# Patient Record
Sex: Male | Born: 1945
Health system: Southern US, Community
[De-identification: ages and names within clinical notes are randomized; demographics above are authoritative.]

## PROBLEM LIST (undated history)

## (undated) DIAGNOSIS — M1990 Unspecified osteoarthritis, unspecified site: Secondary | ICD-10-CM

## (undated) DIAGNOSIS — Z8619 Personal history of other infectious and parasitic diseases: Secondary | ICD-10-CM

## (undated) DIAGNOSIS — I10 Essential (primary) hypertension: Secondary | ICD-10-CM

## (undated) DIAGNOSIS — E785 Hyperlipidemia, unspecified: Secondary | ICD-10-CM

## (undated) DIAGNOSIS — C61 Malignant neoplasm of prostate: Secondary | ICD-10-CM

## (undated) DIAGNOSIS — K219 Gastro-esophageal reflux disease without esophagitis: Secondary | ICD-10-CM

## (undated) HISTORY — PX: ULNAR NERVE TRANSPOSITION: SHX2595

## (undated) HISTORY — PX: SPINE SURGERY: SHX786

## (undated) HISTORY — PX: SKIN GRAFT FULL THICKNESS ARM: SUR1297

## (undated) HISTORY — DX: Personal history of other infectious and parasitic diseases: Z86.19

## (undated) HISTORY — DX: Hyperlipidemia, unspecified: E78.5

## (undated) HISTORY — PX: EYE SURGERY: SHX253

## (undated) HISTORY — PX: OTHER SURGICAL HISTORY: SHX169

## (undated) HISTORY — DX: Gastro-esophageal reflux disease without esophagitis: K21.9

## (undated) HISTORY — DX: Essential (primary) hypertension: I10

## (undated) HISTORY — PX: PROSTATE BIOPSY: SHX241

---

## 2010-10-14 ENCOUNTER — Encounter: Payer: Self-pay | Admitting: Internal Medicine

## 2010-10-14 ENCOUNTER — Ambulatory Visit: Admit: 2010-10-14 | Payer: Self-pay | Admitting: Internal Medicine

## 2010-10-14 ENCOUNTER — Ambulatory Visit (INDEPENDENT_AMBULATORY_CARE_PROVIDER_SITE_OTHER): Payer: BC Managed Care – PPO | Admitting: Internal Medicine

## 2010-10-14 DIAGNOSIS — E663 Overweight: Secondary | ICD-10-CM

## 2010-10-14 DIAGNOSIS — Z6825 Body mass index (BMI) 25.0-25.9, adult: Secondary | ICD-10-CM

## 2010-10-14 DIAGNOSIS — I10 Essential (primary) hypertension: Secondary | ICD-10-CM | POA: Insufficient documentation

## 2010-10-14 DIAGNOSIS — R635 Abnormal weight gain: Secondary | ICD-10-CM

## 2010-10-14 LAB — BASIC METABOLIC PANEL
BUN: 16 mg/dL (ref 6–23)
CO2: 29 mEq/L (ref 19–32)
Chloride: 101 mEq/L (ref 96–112)
Creatinine, Ser: 1 mg/dL (ref 0.4–1.5)

## 2010-10-14 MED ORDER — VALSARTAN-HYDROCHLOROTHIAZIDE 80-12.5 MG PO TABS: 1.0000 | ORAL_TABLET | Freq: Every day | ORAL | Status: DC

## 2010-10-14 MED ORDER — VALSARTAN 80 MG PO TABS: 80.0000 mg | ORAL_TABLET | Freq: Two times a day (BID) | ORAL | Status: DC

## 2010-10-14 MED ORDER — HYDROCHLOROTHIAZIDE 12.5 MG PO CAPS: 12.5000 mg | ORAL_CAPSULE | Freq: Every day | ORAL | Status: DC

## 2010-10-14 NOTE — Patient Instructions (Signed)
  Place appropriate patient instructions regarding hypertension here. 

## 2010-10-14 NOTE — Progress Notes (Signed)
Addended by: Stacie Glaze MD on: 10/14/2010 03:33 PM   Modules accepted: Orders

## 2010-10-14 NOTE — Progress Notes (Signed)
Addended by: Rita Ohara on: 10/14/2010 04:09 PM   Modules accepted: Orders

## 2010-10-14 NOTE — Progress Notes (Signed)
  Subjective:    Anthony Mann is a 65 y.o. male who presents for evaluation of elevated blood pressures. 59 at onset of elevated blood pressure: . Cardiac symptoms: none. Patient denies: chest pain. Cardiovascular risk factors: none. Use of agents associated with hypertension: NSAIDS. History of target organ damage: none.  The following portions of the patient's history were reviewed and updated as appropriate: allergies, current medications, past family history, past medical history, past social history, past surgical history and problem list.  Review of Systems A comprehensive review of systems was negative.   Objective:    BP 140/80  Pulse 76  Temp(Src) 98.2 F (36.8 C) (Oral)  Resp 14  Ht 5\' 7"  (1.702 m)  Wt 186 lb (84.369 kg)  BMI 29.13 kg/m2  General Appearance:    Alert, cooperative, no distress, appears stated age  Head:    Normocephalic, without obvious abnormality, atraumatic  Eyes:    PERRL, conjunctiva/corneas clear, EOM's intact, fundi    benign, both eyes       Ears:    Normal TM's and external ear canals, both ears  Nose:   Nares normal, septum midline, mucosa normal, no drainage    or sinus tenderness  Throat:   Lips, mucosa, and tongue normal; teeth and gums normal  Neck:   Supple, symmetrical, trachea midline, no adenopathy;       thyroid:  No enlargement/tenderness/nodules; no carotid   bruit or JVD  Back:     Symmetric, no curvature, ROM normal, no CVA tenderness  Lungs:     Clear to auscultation bilaterally, respirations unlabored  Chest wall:    No tenderness or deformity  Heart:    Regular rate and rhythm, S1 and S2 normal, no murmur, rub   or gallop  Abdomen:     Soft, non-tender, bowel sounds active all four quadrants,    no masses, no organomegaly  Genitalia:    Normal male without lesion, discharge or tenderness  Rectal:    Normal tone, normal prostate, no masses or tenderness;   guaiac negative stool  Extremities:   Extremities normal, atraumatic, no  cyanosis or edema  Pulses:   2+ and symmetric all extremities  Skin:   Skin color, texture, turgor normal, no rashes or lesions  Lymph nodes:   Cervical, supraclavicular, and axillary nodes normal  Neurologic:   CNII-XII intact. Normal strength, sensation and reflexes      throughout    Cardiographics ECG: normal sinus rhythm per the pts records from his prior MD. He had a stress test two years ago with out any ischemia noted   Assessment:    Hypertension, stage 1. Evidence of target organ damage: none.   Plan:    Check blood pressures 1 times weekly and record. patient will be monitored on three-month intervals he has been used to having a been a check for his potassium since he is on a diuretic he will have a yearly physical and we will obtain his records from his prior physician's office and review them including the history of his stress test.  He has no other complaints at this time is compliant with his medication we discussed diet and exercise gave him a plan for both eyes in [place] for 10 pound weight loss prior to his next visit

## 2010-10-23 ENCOUNTER — Other Ambulatory Visit: Payer: Self-pay | Admitting: *Deleted

## 2010-10-23 ENCOUNTER — Other Ambulatory Visit: Payer: Self-pay | Admitting: Internal Medicine

## 2010-10-23 DIAGNOSIS — I10 Essential (primary) hypertension: Secondary | ICD-10-CM

## 2010-10-23 MED ORDER — VALSARTAN-HYDROCHLOROTHIAZIDE 80-12.5 MG PO TABS: 1.0000 | ORAL_TABLET | Freq: Every day | ORAL | Status: DC

## 2011-01-12 ENCOUNTER — Encounter: Payer: Self-pay | Admitting: Internal Medicine

## 2011-01-12 ENCOUNTER — Ambulatory Visit (INDEPENDENT_AMBULATORY_CARE_PROVIDER_SITE_OTHER): Payer: BC Managed Care – PPO | Admitting: Internal Medicine

## 2011-01-12 VITALS — BP 130/80 | HR 72 | Temp 98.2°F | Resp 16 | Ht 66.0 in | Wt 188.0 lb

## 2011-01-12 DIAGNOSIS — M545 Low back pain: Secondary | ICD-10-CM

## 2011-01-12 DIAGNOSIS — I10 Essential (primary) hypertension: Secondary | ICD-10-CM

## 2011-01-12 DIAGNOSIS — Z0289 Encounter for other administrative examinations: Secondary | ICD-10-CM

## 2011-01-12 MED ORDER — APAP-MGSALICY-PHENYLTOLOX-CAFF 500-500-20-50 MG PO TABS: 1.0000 | ORAL_TABLET | Freq: Two times a day (BID) | ORAL | Status: DC | PRN

## 2011-01-12 NOTE — Progress Notes (Signed)
  Subjective:    Patient here for follow-up of elevated blood pressure.  He is exercising and is adherent to a low-salt diet.  Blood pressure is well controlled at home. Cardiac symptoms: none. Patient denies: chest pressure/discomfort. Cardiovascular risk factors: advanced age (older than 57 for men, 61 for women). Use of agents associated with hypertension: none. History of target organ damage: none. Patient also has a an acute/chronic complaint of low back pain he states that this was diagnosed as osteoarthritis in the past it is intermittent it does not occur on a daily basis and sometimes does not occur on a weekly basis patient requests guidance as to exercise and pain control.  The following portions of the patient's history were reviewed and updated as appropriate: allergies, current medications, past family history, past medical history, past social history, past surgical history and problem list.  Review of Systems A comprehensive review of systems was negative.     Objective:    BP 130/80  Pulse 72  Temp(Src) 98.2 F (36.8 C) (Oral)  Resp 16  Ht 5\' 6"  (1.676 m)  Wt 188 lb (85.276 kg)  BMI 30.34 kg/m2  General Appearance:    Alert, cooperative, no distress, appears stated age  Head:    Normocephalic, without obvious abnormality, atraumatic  Eyes:    PERRL, conjunctiva/corneas clear, EOM's intact, fundi    benign, both eyes       Ears:    Normal TM's and external ear canals, both ears  Nose:   Nares normal, septum midline, mucosa normal, no drainage    or sinus tenderness  Throat:   Lips, mucosa, and tongue normal; teeth and gums normal  Neck:   Supple, symmetrical, trachea midline, no adenopathy;       thyroid:  No enlargement/tenderness/nodules; no carotid   bruit or JVD  Back:     Symmetric, no curvature, ROM normal, no CVA tenderness  Lungs:     Clear to auscultation bilaterally, respirations unlabored  Chest wall:    No tenderness or deformity  Heart:    Regular rate  and rhythm, S1 and S2 normal, no murmur, rub   or gallop  Abdomen:     Soft, non-tender, bowel sounds active all four quadrants,    no masses, no organomegaly  Genitalia:    Normal male without lesion, discharge or tenderness  Rectal:    Normal tone, normal prostate, no masses or tenderness;   guaiac negative stool  Extremities:   Extremities normal, atraumatic, no cyanosis or edema  Pulses:   2+ and symmetric all extremities  Skin:   Skin color, texture, turgor normal, no rashes or lesions  Lymph nodes:   Cervical, supraclavicular, and axillary nodes normal  Neurologic:   CNII-XII intact. Normal strength, sensation and reflexes      throughout      Assessment:   The patient complains of chronic low back pain that has osteoarthritis in nature  Hypertension, stage 1 . Evidence of target organ damage: none.    Plan:    Medication: continue diovan.  Trial of dural back twice a day when necessary back pain exercises given for chronic back pain

## 2011-04-14 ENCOUNTER — Encounter: Payer: Self-pay | Admitting: Internal Medicine

## 2011-04-14 ENCOUNTER — Ambulatory Visit (INDEPENDENT_AMBULATORY_CARE_PROVIDER_SITE_OTHER): Payer: BC Managed Care – PPO | Admitting: Internal Medicine

## 2011-04-14 VITALS — BP 130/82 | HR 68 | Temp 98.2°F | Resp 16 | Ht 67.0 in | Wt 180.0 lb

## 2011-04-14 DIAGNOSIS — Z0000 Encounter for general adult medical examination without abnormal findings: Secondary | ICD-10-CM

## 2011-04-14 DIAGNOSIS — I10 Essential (primary) hypertension: Secondary | ICD-10-CM

## 2011-04-14 LAB — BASIC METABOLIC PANEL
BUN: 19 mg/dL (ref 6–23)
Chloride: 104 mEq/L (ref 96–112)
GFR: 71.66 mL/min (ref >60.00)
Glucose, Bld: 100 mg/dL — ABNORMAL HIGH (ref 70–99)
Potassium: 4 mEq/L (ref 3.5–5.1)
Sodium: 137 mEq/L (ref 135–145)

## 2011-04-14 MED ORDER — LOSARTAN POTASSIUM-HCTZ 100-25 MG PO TABS: 1.0000 | ORAL_TABLET | Freq: Every day | ORAL | Status: DC

## 2011-04-14 NOTE — Progress Notes (Signed)
  Subjective:    Patient ID: Anthony Mann, male    DOB: May 31, 1946, 65 y.o.   MRN: [id number]  HPI The patient has lost 8 pounds since his last visit his blood pressure is better controlled at 130/82. He is on Diovan 80-12.5 Stable on this medication a basic metabolic panel will be drawn today a long-term prescription given Review of Systems  Constitutional: Negative for fever and fatigue.  HENT: Negative for hearing loss, congestion, neck pain and postnasal drip.   Eyes: Negative for discharge, redness and visual disturbance.  Respiratory: Negative for cough, shortness of breath and wheezing.   Cardiovascular: Negative for leg swelling.  Gastrointestinal: Negative for abdominal pain, constipation and abdominal distention.  Genitourinary: Negative for urgency and frequency.  Musculoskeletal: Negative for joint swelling and arthralgias.  Skin: Negative for color change and rash.  Neurological: Negative for weakness and light-headedness.  Hematological: Negative for adenopathy.  Psychiatric/Behavioral: Negative for behavioral problems.   Past Medical History  Diagnosis Date  . Hypertension   . History of hepatitis B   . Cataract     right eye   Past Surgical History  Procedure Date  . Eye surgery     right eye cataract  in [place]  . Arm trauma     he had an industrial accident in 1991   . Skin graft full thickness arm     right arm  . Ulnar nerve transposition     in 1991    reports that he has never smoked. He has never used smokeless tobacco. He reports that he drinks alcohol. He reports that he does not use illicit drugs. family history includes Cancer in his father; Diabetes in his mother; Heart disease in his father and mother; and Kidney disease in his father. No Known Allergies     Objective:   Physical Exam  Nursing note and vitals reviewed. Constitutional: He appears well-developed and well-nourished.  HENT:  Head: Normocephalic and atraumatic.  Eyes: Conjunctivae  are normal. Pupils are equal, round, and reactive to light.  Neck: Normal range of motion. Neck supple.  Cardiovascular: Normal rate and regular rhythm.   Pulmonary/Chest: Effort normal and breath sounds normal.  Abdominal: Soft. Bowel sounds are normal.          Assessment & Plan:  Due to changes in the formulary of the patient's insurance plan he will no longer be able to take Diovan. His formulary states that Hyzaar is the only covered ARB we discussed with the patient in detail and we'll switch the patient to Hyzaar 100-25 We spent 30 minutes face-to-face counseling the patient on blood pressure medicine changes and the potential effect of the increased dose of hydrochlorothiazide and we will obtain a basic metabolic panel to see what the patient's potassium is so we can monitor the effect of the increased hydrochlorothiazide on his potassium.  Patient is aware of the change in the reason for the change in

## 2011-05-13 ENCOUNTER — Telehealth: Payer: Self-pay | Admitting: Internal Medicine

## 2011-05-13 DIAGNOSIS — I10 Essential (primary) hypertension: Secondary | ICD-10-CM

## 2011-05-13 MED ORDER — LOSARTAN POTASSIUM-HCTZ 100-25 MG PO TABS: 1.0000 | ORAL_TABLET | Freq: Every day | ORAL | Status: DC

## 2011-05-13 NOTE — Telephone Encounter (Signed)
Pt requesting a 90 day refill on losartan-hydrochlorothiazide (HYZAAR) 100-25 MG per tablet [facility]

## 2011-05-13 NOTE — Telephone Encounter (Signed)
Sent!

## 2011-05-20 ENCOUNTER — Telehealth: Payer: Self-pay | Admitting: Internal Medicine

## 2011-05-20 DIAGNOSIS — I10 Essential (primary) hypertension: Secondary | ICD-10-CM

## 2011-05-20 MED ORDER — LOSARTAN POTASSIUM-HCTZ 100-25 MG PO TABS: 1.0000 | ORAL_TABLET | Freq: Every day | ORAL | Status: DC

## 2011-05-20 NOTE — Telephone Encounter (Signed)
rx sent in electronically 

## 2011-05-20 NOTE — Telephone Encounter (Signed)
Please resend losartan-hydrochlorothiazide (HYZAAR) 100-25 MG per tablet to [facility]. Was sent on  05/13/11 [facility] said [facility] did not recieve

## 2011-05-25 ENCOUNTER — Telehealth: Payer: Self-pay | Admitting: Internal Medicine

## 2011-05-25 DIAGNOSIS — I10 Essential (primary) hypertension: Secondary | ICD-10-CM

## 2011-05-25 MED ORDER — LOSARTAN POTASSIUM-HCTZ 100-25 MG PO TABS: 1.0000 | ORAL_TABLET | Freq: Every day | ORAL | Status: DC

## 2011-05-25 NOTE — Telephone Encounter (Signed)
Pt is still having an issuses with [facility]. RX has been sent twice and [facility] is saying they have not received it. Pt has requested we call it in or fax it.   Please resend losartan-hydrochlorothiazide (HYZAAR) 100-25 MG per tablet to [facility]. Was sent on 05/13/11 and 05/20/11.

## 2011-05-25 NOTE — Telephone Encounter (Signed)
rx faxed [facility]

## 2011-07-13 ENCOUNTER — Ambulatory Visit (INDEPENDENT_AMBULATORY_CARE_PROVIDER_SITE_OTHER): Payer: 59

## 2011-07-13 DIAGNOSIS — Z23 Encounter for immunization: Secondary | ICD-10-CM

## 2011-09-03 ENCOUNTER — Other Ambulatory Visit: Payer: 59

## 2011-09-03 ENCOUNTER — Other Ambulatory Visit: Payer: Self-pay

## 2011-09-15 ENCOUNTER — Ambulatory Visit (INDEPENDENT_AMBULATORY_CARE_PROVIDER_SITE_OTHER): Payer: BC Managed Care – PPO | Admitting: Internal Medicine

## 2011-09-15 ENCOUNTER — Encounter: Payer: Self-pay | Admitting: Internal Medicine

## 2011-09-15 VITALS — BP 130/84 | HR 68 | Temp 98.2°F | Resp 16 | Ht 67.0 in | Wt 172.0 lb

## 2011-09-15 DIAGNOSIS — I10 Essential (primary) hypertension: Secondary | ICD-10-CM

## 2011-09-15 DIAGNOSIS — Z0000 Encounter for general adult medical examination without abnormal findings: Secondary | ICD-10-CM

## 2011-09-15 LAB — HEPATIC FUNCTION PANEL
ALT: 26 U/L (ref 0–53)
AST: 25 U/L (ref 0–37)
Bilirubin, Direct: 0.2 mg/dL (ref 0.0–0.3)
Total Bilirubin: 1.2 mg/dL (ref 0.3–1.2)

## 2011-09-15 LAB — LIPID PANEL
LDL Cholesterol: 104 mg/dL — ABNORMAL HIGH (ref 0–99)
Total CHOL/HDL Ratio: 3
Triglycerides: 97 mg/dL (ref 0.0–149.0)
VLDL: 19.4 mg/dL (ref 0.0–40.0)

## 2011-09-15 LAB — POCT URINALYSIS DIPSTICK
Ketones, UA: NEGATIVE
Leukocytes, UA: NEGATIVE
Nitrite, UA: NEGATIVE
Protein, UA: NEGATIVE
Urobilinogen, UA: 0.2
pH, UA: 6

## 2011-09-15 LAB — BASIC METABOLIC PANEL
Chloride: 102 mEq/L (ref 96–112)
Creatinine, Ser: 1 mg/dL (ref 0.4–1.5)
Potassium: 4.2 mEq/L (ref 3.5–5.1)

## 2011-09-15 LAB — CBC WITH DIFFERENTIAL/PLATELET
Basophils Absolute: 0 10*3/uL (ref 0.0–0.1)
Hemoglobin: 16 g/dL (ref 13.0–17.0)
Lymphocytes Relative: 24.6 % (ref 12.0–46.0)
Monocytes Relative: 8.5 % (ref 3.0–12.0)
Neutro Abs: 5.8 10*3/uL (ref 1.4–7.7)
Neutrophils Relative %: 66 % (ref 43.0–77.0)
RDW: 12.9 % (ref 11.5–14.6)

## 2011-09-15 LAB — TSH: TSH: 1.54 u[IU]/mL (ref 0.35–5.50)

## 2011-09-15 LAB — PSA: PSA: 2.07 ng/mL (ref 0.10–4.00)

## 2011-09-15 MED ORDER — LOSARTAN POTASSIUM-HCTZ 100-25 MG PO TABS: 1.0000 | ORAL_TABLET | Freq: Every day | ORAL | Status: DC

## 2011-09-15 NOTE — Patient Instructions (Signed)
The patient is instructed to continue all medications as prescribed. Schedule followup with check out clerk upon leaving the clinic  

## 2011-09-15 NOTE — Progress Notes (Signed)
Subjective:    Patient ID: Anthony Mann, male    DOB: 02-01-1946, 66 y.o.   MRN: [id number]  HPI The patient is a 66 year old male who presents for complete physical examination today. He is treated for hypertension and is on omega-3 supplementation for cholesterol. He also takes medications for chronic back pain. He takes these when necessary and this has been infrequent. 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  HENT: Negative for hearing loss, congestion, neck pain and postnasal drip.   Eyes: Negative for discharge, redness and visual disturbance.  Respiratory: Negative for cough, shortness of breath and wheezing.   Cardiovascular: Negative for leg swelling.  Gastrointestinal: Negative for abdominal pain, constipation and abdominal distention.  Genitourinary: Negative for urgency and frequency.  Musculoskeletal: Negative for joint swelling and arthralgias.  Skin: Negative for color change and rash.  Neurological: Negative for weakness and light-headedness.  Hematological: Negative for adenopathy.  Psychiatric/Behavioral: Negative for behavioral problems.   Past Medical History  Diagnosis Date  . Hypertension   . History of hepatitis B   . Cataract     right eye    History   Social History  . Marital Status: Married    Spouse Name: donna    Number of Children: 2  . Years of Education: N/A   Occupational History  . retired    Social History Main Topics  . Smoking status: Never Smoker   . Smokeless tobacco: Never Used  . Alcohol Use: Yes  . Drug Use: No  . Sexually Active: Yes    Birth Control/ Protection: None   Other Topics Concern  . Not on file   Social History Narrative  . No narrative on file    Past Surgical History  Procedure Date  . Eye surgery     right eye cataract  in [place]  . Arm trauma     he had an industrial accident in  1991   . Skin graft full thickness arm     right arm  . Ulnar nerve transposition     in 1991    Family History  Problem Relation Age of Onset  . Cancer Father     colon  . Heart disease Father   . Kidney disease Father     nephectomy  . Heart disease Mother   . Diabetes Mother     No Known Allergies  Current Outpatient Prescriptions on File Prior to Visit  Medication Sig Dispense Refill  . APAP-MgSalicy-Phenyltolox-Caff 500-500-20-50 MG TABS Take 1 tablet by mouth 2 (two) times daily as needed.  30 each  3  . fish oil-omega-3 fatty acids 1000 MG capsule Take 1 g by mouth daily.       Marland Kitchen losartan-hydrochlorothiazide (HYZAAR) 100-25 MG per tablet Take 1 tablet by mouth daily.  90 tablet  3  . naproxen sodium (ANAPROX) 220 MG tablet Take 220 mg by mouth as needed.          BP 130/84  Pulse 68  Temp 98.2 F (36.8 C)  Resp 16  Ht 5\' 7"  (1.702 m)  Wt 172 lb (78.019 kg)  BMI 26.94 kg/m2       Objective:   Physical Exam  Nursing note and vitals reviewed. Constitutional: He is oriented to person, place, and time. He appears well-developed and well-nourished.  HENT:  Head: Normocephalic and atraumatic.  Eyes: Conjunctivae are normal. Pupils are equal, round, and reactive to light.  Neck: Normal range of motion. Neck supple.  Cardiovascular: Normal rate and regular rhythm.   Pulmonary/Chest: Effort normal and breath sounds normal.  Abdominal: Soft. Bowel sounds are normal.  Genitourinary: Rectum normal and prostate normal.  Musculoskeletal: Normal range of motion.  Neurological: He is alert and oriented to person, place, and time.  Skin: Skin is warm and dry.  Psychiatric: He has a normal mood and affect. His behavior is normal.          Assessment & Plan:   Patient presents for yearly preventative medicine examination.   all immunizations and health maintenance protocols were reviewed with the patient and they are up to date with these protocols.   screening  laboratory values were reviewed with the patient including screening of hyperlipidemia PSA renal function and hepatic function.   There medications past medical history social history problem list and allergies were reviewed in detail.   Goals were established with regard to weight loss exercise diet in compliance with medications Patient pressure stable on his current medications we will give him 90 day supply with 3 refills

## 2012-03-14 ENCOUNTER — Ambulatory Visit (INDEPENDENT_AMBULATORY_CARE_PROVIDER_SITE_OTHER): Payer: BC Managed Care – PPO | Admitting: Internal Medicine

## 2012-03-14 ENCOUNTER — Encounter: Payer: Self-pay | Admitting: Internal Medicine

## 2012-03-14 VITALS — BP 136/74 | HR 72 | Temp 98.4°F | Ht 66.0 in | Wt 173.0 lb

## 2012-03-14 DIAGNOSIS — I10 Essential (primary) hypertension: Secondary | ICD-10-CM

## 2012-03-14 DIAGNOSIS — M76899 Other specified enthesopathies of unspecified lower limb, excluding foot: Secondary | ICD-10-CM

## 2012-03-14 DIAGNOSIS — M6580 Other synovitis and tenosynovitis, unspecified site: Secondary | ICD-10-CM

## 2012-03-14 DIAGNOSIS — Z0000 Encounter for general adult medical examination without abnormal findings: Secondary | ICD-10-CM

## 2012-03-14 MED ORDER — LOSARTAN POTASSIUM-HCTZ 100-25 MG PO TABS: 1.0000 | ORAL_TABLET | Freq: Every day | ORAL | Status: DC

## 2012-03-14 NOTE — Progress Notes (Signed)
Subjective:    Patient ID: Anthony Mann, male    DOB: 09-12-1946, 66 y.o.   MRN: [id number]  HPI Working on weight loss through "hard work" Stable on the fish oil and the HTN medications   Review of Systems  Constitutional: Negative for fever and fatigue.  HENT: Negative for hearing loss, congestion, neck pain and postnasal drip.   Eyes: Negative for discharge, redness and visual disturbance.  Respiratory: Negative for cough, shortness of breath and wheezing.   Cardiovascular: Negative for leg swelling.  Gastrointestinal: Negative for abdominal pain, constipation and abdominal distention.  Genitourinary: Negative for urgency and frequency.  Musculoskeletal: Negative for joint swelling and arthralgias.  Skin: Negative for color change and rash.  Neurological: Negative for weakness and light-headedness.  Hematological: Negative for adenopathy.  Psychiatric/Behavioral: Negative for behavioral problems.   Past Medical History  Diagnosis Date  . Hypertension   . History of hepatitis B   . Cataract     right eye    History   Social History  . Marital Status: Married    Spouse Name: donna    Number of Children: 2  . Years of Education: N/A   Occupational History  . retired    Social History Main Topics  . Smoking status: Never Smoker   . Smokeless tobacco: Never Used  . Alcohol Use: Yes  . Drug Use: No  . Sexually Active: Yes    Birth Control/ Protection: None   Other Topics Concern  . Not on file   Social History Narrative  . No narrative on file    Past Surgical History  Procedure Date  . Eye surgery     right eye cataract  in [place]  . Arm trauma     he had an industrial accident in 1991   . Skin graft full thickness arm     right arm  . Ulnar nerve transposition     in 1991    Family History  Problem Relation Age of Onset  . Cancer Father     colon  . Heart disease Father   . Kidney disease Father     nephectomy  . Heart disease Mother   .  Diabetes Mother     No Known Allergies  Current Outpatient Prescriptions on File Prior to Visit  Medication Sig Dispense Refill  . fish oil-omega-3 fatty acids 1000 MG capsule Take 1 g by mouth daily.       Marland Kitchen losartan-hydrochlorothiazide (HYZAAR) 100-25 MG per tablet Take 1 tablet by mouth daily.  90 tablet  3  . APAP-MgSalicy-Phenyltolox-Caff 500-500-20-50 MG TABS Take 1 tablet by mouth 2 (two) times daily as needed.  30 each  3    BP 136/74  Pulse 72  Temp 98.4 F (36.9 C) (Oral)  Ht 5\' 6"  (1.676 m)  Wt 173 lb (78.472 kg)  BMI 27.92 kg/m2       Objective:   Physical Exam  Nursing note and vitals reviewed. Constitutional: He appears well-developed and well-nourished.  HENT:  Head: Normocephalic and atraumatic.  Eyes: Conjunctivae are normal. Pupils are equal, round, and reactive to light.  Neck: Normal range of motion. Neck supple.  Cardiovascular: Normal rate and regular rhythm.   Pulmonary/Chest: Effort normal and breath sounds normal.  Abdominal: Soft. Bowel sounds are normal.          Assessment & Plan:  Patient has a pulled hamstring we talked him how to appropriate stretches to mobilize hamstring muscle.  The patient has  hypertension that is stable his current medications we sent him a refillable prescription for his medications for the year.  The patient has mild to moderate hyperlipidemia stable his current medications we will check a lipid and liver at the time of his physical in January blood work was ordered for his physical in January and return visit was ordered as was discussed in detail with the

## 2012-03-14 NOTE — Patient Instructions (Addendum)
The patient is instructed to continue all medications as prescribed. Schedule followup with check out clerk upon leaving the clinic "hamstring tendonitis"

## 2012-04-24 ENCOUNTER — Other Ambulatory Visit: Payer: Self-pay | Admitting: Internal Medicine

## 2012-08-07 ENCOUNTER — Other Ambulatory Visit: Payer: Self-pay | Admitting: Internal Medicine

## 2012-09-11 ENCOUNTER — Other Ambulatory Visit (INDEPENDENT_AMBULATORY_CARE_PROVIDER_SITE_OTHER): Payer: BC Managed Care – PPO

## 2012-09-11 DIAGNOSIS — Z0000 Encounter for general adult medical examination without abnormal findings: Secondary | ICD-10-CM

## 2012-09-11 LAB — POCT URINALYSIS DIPSTICK
Blood, UA: NEGATIVE
Ketones, UA: NEGATIVE
Protein, UA: NEGATIVE
Spec Grav, UA: 1.025
Urobilinogen, UA: 0.2

## 2012-09-11 LAB — LIPID PANEL
Cholesterol: 190 mg/dL (ref 0–200)
HDL: 57.6 mg/dL (ref >39.00)
Total CHOL/HDL Ratio: 3
Triglycerides: 112 mg/dL (ref 0.0–149.0)

## 2012-09-11 LAB — BASIC METABOLIC PANEL
CO2: 27 mEq/L (ref 19–32)
Calcium: 9.6 mg/dL (ref 8.4–10.5)
Chloride: 104 mEq/L (ref 96–112)
Sodium: 139 mEq/L (ref 135–145)

## 2012-09-11 LAB — HEPATIC FUNCTION PANEL
Albumin: 4.2 g/dL (ref 3.5–5.2)
Bilirubin, Direct: 0.1 mg/dL (ref 0.0–0.3)
Total Protein: 6.9 g/dL (ref 6.0–8.3)

## 2012-09-11 LAB — TSH: TSH: 1.13 u[IU]/mL (ref 0.35–5.50)

## 2012-09-12 LAB — CBC WITH DIFFERENTIAL/PLATELET
Basophils Absolute: 0.2 10*3/uL — ABNORMAL HIGH (ref 0.0–0.1)
Eosinophils Absolute: 0.1 10*3/uL (ref 0.0–0.7)
HCT: 45.9 % (ref 39.0–52.0)
Lymphs Abs: 2.5 10*3/uL (ref 0.7–4.0)
MCHC: 34 g/dL (ref 30.0–36.0)
MCV: 96.7 fl (ref 78.0–100.0)
Monocytes Absolute: 0.1 10*3/uL (ref 0.1–1.0)
Platelets: 216 10*3/uL (ref 150.0–400.0)
RDW: 12.5 % (ref 11.5–14.6)

## 2012-09-18 ENCOUNTER — Encounter: Payer: Self-pay | Admitting: Internal Medicine

## 2012-09-18 ENCOUNTER — Ambulatory Visit (INDEPENDENT_AMBULATORY_CARE_PROVIDER_SITE_OTHER): Payer: BC Managed Care – PPO | Admitting: Internal Medicine

## 2012-09-18 VITALS — BP 133/78 | HR 72 | Temp 98.2°F | Resp 16 | Ht 66.0 in | Wt 168.0 lb

## 2012-09-18 DIAGNOSIS — M549 Dorsalgia, unspecified: Secondary | ICD-10-CM

## 2012-09-18 DIAGNOSIS — Z0000 Encounter for general adult medical examination without abnormal findings: Secondary | ICD-10-CM

## 2012-09-18 MED ORDER — BUTALBITAL-ACETAMINOPHEN 50-325 MG PO TABS: 2.0000 | ORAL_TABLET | Freq: Three times a day (TID) | ORAL | Status: DC | PRN

## 2012-09-18 NOTE — Progress Notes (Signed)
Subjective:    Patient ID: Anthony Mann, male    DOB: 1946-07-05, 67 y.o.   MRN: [id number]  HPI CPX   Review of Systems  Constitutional: Negative for fever and fatigue.  HENT: Negative for hearing loss, congestion, neck pain and postnasal drip.   Eyes: Negative for discharge, redness and visual disturbance.  Respiratory: Negative for cough, shortness of breath and wheezing.   Cardiovascular: Negative for leg swelling.  Gastrointestinal: Negative for abdominal pain, constipation and abdominal distention.  Genitourinary: Negative for urgency and frequency.  Musculoskeletal: Negative for joint swelling and arthralgias.  Skin: Negative for color change and rash.  Neurological: Negative for weakness and light-headedness.  Hematological: Negative for adenopathy.  Psychiatric/Behavioral: Negative for behavioral problems.   Past Medical History  Diagnosis Date  . Hypertension   . History of hepatitis B   . Cataract     right eye    History   Social History  . Marital Status: Married    Spouse Name: donna    Number of Children: 2  . Years of Education: N/A   Occupational History  . retired    Social History Main Topics  . Smoking status: Never Smoker   . Smokeless tobacco: Never Used  . Alcohol Use: Yes  . Drug Use: No  . Sexually Active: Yes    Birth Control/ Protection: None   Other Topics Concern  . Not on file   Social History Narrative  . No narrative on file    Past Surgical History  Procedure Date  . Eye surgery     right eye cataract  in [place]  . Arm trauma     he had an industrial accident in 1991   . Skin graft full thickness arm     right arm  . Ulnar nerve transposition     in 1991    Family History  Problem Relation Age of Onset  . Cancer Father     colon  . Heart disease Father   . Kidney disease Father     nephectomy  . Heart disease Mother   . Diabetes Mother     No Known Allergies  Current Outpatient Prescriptions on File Prior to  Visit  Medication Sig Dispense Refill  . butalbital-acetaminophen-caffeine (ESGIC PLUS) 50-500-40 MG per tablet TAKE 1 TABLET BY MOUTH TWICE A DAY AS NEEDED  30 tablet  0  . fish oil-omega-3 fatty acids 1000 MG capsule Take 1 g by mouth daily.       Marland Kitchen losartan-hydrochlorothiazide (HYZAAR) 100-25 MG per tablet Take 1 tablet by mouth daily.  90 tablet  3    BP 140/80  Pulse 72  Temp 98.2 F (36.8 C)  Resp 16  Ht 5\' 6"  (1.676 m)  Wt 168 lb (76.204 kg)  BMI 27.12 kg/m2       Objective:   Physical Exam  Nursing note and vitals reviewed. Constitutional: He appears well-developed and well-nourished.  HENT:  Head: Normocephalic and atraumatic.  Eyes: Conjunctivae normal are normal. Pupils are equal, round, and reactive to light.  Neck: Normal range of motion. Neck supple.  Cardiovascular: Normal rate and regular rhythm.   Pulmonary/Chest: Effort normal and breath sounds normal.  Abdominal: Soft. Bowel sounds are normal.  Musculoskeletal: Normal range of motion.  Neurological: He is alert.  Skin: Skin is warm and dry.  Psychiatric: He has a normal mood and affect. His behavior is normal.          Assessment & Plan:  Patient presents for yearly preventative medicine examination.   all immunizations and health maintenance protocols were reviewed with the patient and they are up to date with these protocols.   screening laboratory values were reviewed with the patient including screening of hyperlipidemia PSA renal function and hepatic function.   There medications past medical history social history problem list and allergies were reviewed in detail.   Goals were established with regard to weight loss exercise diet in compliance with medications  Stable on medications Discussed the role of sALT

## 2012-09-18 NOTE — Patient Instructions (Signed)
The patient is instructed to continue all medications as prescribed. Schedule followup with check out clerk upon leaving the clinic  

## 2012-09-20 ENCOUNTER — Telehealth: Payer: Self-pay | Admitting: Internal Medicine

## 2012-09-20 MED ORDER — CYCLOBENZAPRINE HCL 10 MG PO TABS: 10.0000 mg | ORAL_TABLET | Freq: Three times a day (TID) | ORAL | Status: DC | PRN

## 2012-09-20 NOTE — Telephone Encounter (Signed)
Per dr Lovell Sheehan- may send in flexeril 10mg  1 tid prn with 9 refill-wife informed

## 2012-09-20 NOTE — Telephone Encounter (Signed)
Spouse is calling to say patient was in to see Dr Lovell Sheehan on 09/18/12 for back pain.  Pt was prescribed Acetaminophen - Butalbital.  Caller states that this medication does not help the patient and he needs a muscle relaxant.  Caller is requesting a RX of Flexeril for pt.  OFFICE PLEASE FOLLOW UP WITH CALLER/PATIENT REGARDING RX REQUEST.

## 2012-11-19 ENCOUNTER — Other Ambulatory Visit: Payer: Self-pay | Admitting: Internal Medicine

## 2012-11-27 ENCOUNTER — Other Ambulatory Visit: Payer: Self-pay | Admitting: Internal Medicine

## 2013-01-18 ENCOUNTER — Other Ambulatory Visit: Payer: Self-pay | Admitting: Internal Medicine

## 2013-03-19 ENCOUNTER — Ambulatory Visit (INDEPENDENT_AMBULATORY_CARE_PROVIDER_SITE_OTHER): Payer: BC Managed Care – PPO | Admitting: Internal Medicine

## 2013-03-19 ENCOUNTER — Encounter: Payer: Self-pay | Admitting: Internal Medicine

## 2013-03-19 VITALS — BP 136/80 | HR 72 | Temp 98.2°F | Resp 16 | Ht 66.0 in | Wt 170.0 lb

## 2013-03-19 DIAGNOSIS — I10 Essential (primary) hypertension: Secondary | ICD-10-CM

## 2013-03-19 DIAGNOSIS — M62838 Other muscle spasm: Secondary | ICD-10-CM

## 2013-03-19 MED ORDER — CYCLOBENZAPRINE HCL 10 MG PO TABS: 10.0000 mg | ORAL_TABLET | Freq: Two times a day (BID) | ORAL | Status: DC | PRN

## 2013-03-19 NOTE — Patient Instructions (Addendum)
The patient is instructed to continue all medications as prescribed. Schedule followup with check out clerk upon leaving the clinic  

## 2013-03-19 NOTE — Progress Notes (Signed)
Subjective:    Patient ID: Anthony Mann, male    DOB: 1946/01/06, 67 y.o.   MRN: [id number]  HPI  Patient is followed for hypertension.  He is on fish oil supplementation or omega-3 supplementation for hyperlipidemia.  He rarely uses the plastic plus for headache he does use of Flexeril for muscle spasm on an as-needed basis  Review of Systems  Constitutional: Negative for fever and fatigue.  HENT: Negative for hearing loss, congestion, neck pain and postnasal drip.   Eyes: Negative for discharge, redness and visual disturbance.  Respiratory: Negative for cough, shortness of breath and wheezing.   Cardiovascular: Negative for leg swelling.  Gastrointestinal: Negative for abdominal pain, constipation and abdominal distention.  Genitourinary: Negative for urgency and frequency.  Musculoskeletal: Negative for joint swelling and arthralgias.  Skin: Negative for color change and rash.  Neurological: Negative for weakness and light-headedness.  Hematological: Negative for adenopathy.  Psychiatric/Behavioral: Negative for behavioral problems.   Past Medical History  Diagnosis Date  . Hypertension   . History of hepatitis B   . Cataract     right eye    History   Social History  . Marital Status: Married    Spouse Name: donna    Number of Children: 2  . Years of Education: N/A   Occupational History  . retired    Social History Main Topics  . Smoking status: Never Smoker   . Smokeless tobacco: Never Used  . Alcohol Use: Yes  . Drug Use: No  . Sexually Active: Yes    Birth Control/ Protection: None   Other Topics Concern  . Not on file   Social History Narrative  . No narrative on file    Past Surgical History  Procedure Laterality Date  . Eye surgery      right eye cataract  in [place]  . Arm trauma      he had an industrial accident in 1991   . Skin graft full thickness arm      right arm  . Ulnar nerve transposition      in 1991    Family History  Problem  Relation Age of Onset  . Cancer Father     colon  . Heart disease Father   . Kidney disease Father     nephectomy  . Heart disease Mother   . Diabetes Mother     No Known Allergies  Current Outpatient Prescriptions on File Prior to Visit  Medication Sig Dispense Refill  . ACETAMINOPHEN-BUTALBITAL 50-325 MG TABS Take 2 tablets by mouth 3 (three) times daily as needed.  120 each  0  . butalbital-acetaminophen-caffeine (ESGIC PLUS) 50-500-40 MG per tablet TAKE 1 TABLET BY MOUTH TWICE A DAY AS NEEDED  30 tablet  0  . cyclobenzaprine (FLEXERIL) 10 MG tablet TAKE 1 TABLET BY MOUTH 3 TIMES A DAY AS NEEDED FOR MUSCLE SPASMS.  30 tablet  0  . fish oil-omega-3 fatty acids 1000 MG capsule Take 1 g by mouth daily.       Marland Kitchen losartan-hydrochlorothiazide (HYZAAR) 100-25 MG per tablet TAKE 1 TABLET BY MOUTH DAILY  90 tablet  2   No current facility-administered medications on file prior to visit.    BP 136/80  Pulse 72  Temp(Src) 98.2 F (36.8 C)  Resp 16  Ht 5\' 6"  (1.676 m)  Wt 170 lb (77.111 kg)  BMI 27.45 kg/m2       Objective:   Physical Exam  Nursing note and vitals reviewed.  Constitutional: He appears well-developed and well-nourished.  HENT:  Head: Normocephalic and atraumatic.  Eyes: Conjunctivae are normal. Pupils are equal, round, and reactive to light.  Neck: Normal range of motion. Neck supple.  Cardiovascular: Normal rate and regular rhythm.   Pulmonary/Chest: Effort normal and breath sounds normal.  Abdominal: Soft. Bowel sounds are normal.          Assessment & Plan:  Patient's blood pressure is stable on losartan hydrochlorothiazide We will schedule a wellness examination in December or early January Refill flexeril reviewed labs for yearly exam

## 2013-09-24 ENCOUNTER — Encounter: Payer: Self-pay | Admitting: Internal Medicine

## 2013-09-24 ENCOUNTER — Ambulatory Visit (INDEPENDENT_AMBULATORY_CARE_PROVIDER_SITE_OTHER): Payer: BC Managed Care – PPO | Admitting: Internal Medicine

## 2013-09-24 VITALS — BP 160/90 | HR 72 | Temp 98.3°F | Resp 16 | Ht 66.0 in | Wt 172.0 lb

## 2013-09-24 DIAGNOSIS — I10 Essential (primary) hypertension: Secondary | ICD-10-CM | POA: Diagnosis not present

## 2013-09-24 DIAGNOSIS — R42 Dizziness and giddiness: Secondary | ICD-10-CM | POA: Diagnosis not present

## 2013-09-24 MED ORDER — IRBESARTAN-HYDROCHLOROTHIAZIDE 300-12.5 MG PO TABS: 1.0000 | ORAL_TABLET | Freq: Every day | ORAL | Status: DC

## 2013-09-24 NOTE — Progress Notes (Signed)
CINA not available this am 

## 2013-09-24 NOTE — Patient Instructions (Addendum)
Report blood pressure in two weeks   [facility] [address]

## 2013-09-24 NOTE — Progress Notes (Signed)
Subjective:    Patient ID: Anthony Mann, male    DOB: [birth year], 68 y.o.   MRN: [id number]  HPI Elevated blood pressure and dizziness, no chest pain Some blurred vision And does not sleep well  And is on hyzaar    Review of Systems  Constitutional: Positive for fatigue. Negative for fever.  HENT: Negative for congestion, hearing loss and postnasal drip.   Eyes: Negative for discharge, redness and visual disturbance.  Respiratory: Negative for cough, shortness of breath and wheezing.   Cardiovascular: Negative for leg swelling.  Gastrointestinal: Negative for abdominal pain, constipation and abdominal distention.  Genitourinary: Negative for urgency and frequency.  Musculoskeletal: Negative for arthralgias, joint swelling and neck pain.  Skin: Negative for color change and rash.  Neurological: Positive for dizziness and headaches. Negative for weakness and light-headedness.  Hematological: Negative for adenopathy.  Psychiatric/Behavioral: Negative for behavioral problems.   Past Medical History  Diagnosis Date  . Hypertension   . History of hepatitis B   . Cataract     right eye    History   Social History  . Marital Status: Married    Spouse Name: donna    Number of Children: 2  . Years of Education: N/A   Occupational History  . retired    Social History Main Topics  . Smoking status: Never Smoker   . Smokeless tobacco: Never Used  . Alcohol Use: Yes  . Drug Use: No  . Sexual Activity: Yes    Birth Control/ Protection: None   Other Topics Concern  . Not on file   Social History Narrative  . No narrative on file    Past Surgical History  Procedure Laterality Date  . Eye surgery      right eye cataract  in [place]  . Arm trauma      he had an industrial accident in 1991   . Skin graft full thickness arm      right arm  . Ulnar nerve transposition      in 1991    Family History  Problem Relation Age of Onset  . Cancer Father     colon  . Heart  disease Father   . Kidney disease Father     nephectomy  . Heart disease Mother   . Diabetes Mother     No Known Allergies  Current Outpatient Prescriptions on File Prior to Visit  Medication Sig Dispense Refill  . cyclobenzaprine (FLEXERIL) 10 MG tablet Take 1 tablet (10 mg total) by mouth 2 (two) times daily as needed for muscle spasms.  30 tablet  3  . fish oil-omega-3 fatty acids 1000 MG capsule Take 1 g by mouth daily.       Marland Kitchen losartan-hydrochlorothiazide (HYZAAR) 100-25 MG per tablet TAKE 1 TABLET BY MOUTH DAILY  90 tablet  2   No current facility-administered medications on file prior to visit.    BP 160/90  Pulse 72  Temp(Src) 98.3 F (36.8 C)  Resp 16  Ht 5\' 6"  (1.676 m)  Wt 172 lb (78.019 kg)  BMI 27.77 kg/m2        Objective:   Physical Exam  Nursing note and vitals reviewed. Constitutional: He appears well-developed and well-nourished.  HENT:  Head: Normocephalic and atraumatic.  Eyes: Conjunctivae are normal. Pupils are equal, round, and reactive to light.  Neck: Normal range of motion. Neck supple.  Cardiovascular: Normal rate and regular rhythm.   Murmur heard. Pulmonary/Chest: Effort normal and breath sounds  normal.  Abdominal: Soft. Bowel sounds are normal.          Assessment & Plan:  Elevated  blood pressure Symptomatic Change from the hyzaar

## 2013-10-02 DIAGNOSIS — H269 Unspecified cataract: Secondary | ICD-10-CM | POA: Diagnosis not present

## 2013-10-03 ENCOUNTER — Telehealth: Payer: Self-pay | Admitting: Internal Medicine

## 2013-10-03 NOTE — Telephone Encounter (Signed)
Patient Information:  Caller Name: Butch Penny  Phone: [phone]  Patient: Anthony Mann, Anthony Mann  Gender: Male  DOB: 24-Sep-1945  Age: 68 Years  PCP: Benay Pillow (Adults only)  Office Follow Up:  Does the office need to follow up with this patient?: Yes  Instructions For The Office: See RN Note.  Pt wants to know if BP medication needs to be adjusted.  Please f/u with Mortimer Fries.  RN Note:  Anthony Mann was in the office on 09/24/13.  BP meds were changed d/t elevated BP.  Placed on Avalide 300-12/5 1 po QD.  States it does not seem to be coming down.  Has been ranging 160-170/85.  Most recent BP was 160-89 today.  Asymptomatic. Did have cataract surgery yesterday.  Symptoms  Reason For Call & Symptoms: Elevated BP  Reviewed Health History In EMR: Yes  Reviewed Medications In EMR: Yes  Reviewed Allergies In EMR: Yes  Reviewed Surgeries / Procedures: Yes  Date of Onset of Symptoms: 09/19/2013  Guideline(s) Used:  High Blood Pressure  Disposition Per Guideline:   See Within 2 Weeks in Office  Reason For Disposition Reached:   BP > 160/100  Advice Given:  Call Back If:  You become worse.  Patient Will Follow Care Advice:  YES

## 2013-10-03 NOTE — Telephone Encounter (Signed)
Pt wife stated bp med is not working well. Pt wife was transferred to [facility]

## 2013-10-04 NOTE — Telephone Encounter (Signed)
Has only given it 1 week, give it more time and if not down in 3-4 weeks, give call back

## 2013-10-16 ENCOUNTER — Telehealth: Payer: Self-pay | Admitting: Internal Medicine

## 2013-10-16 NOTE — Telephone Encounter (Signed)
Relevant patient education mailed to patient.  

## 2013-12-03 ENCOUNTER — Telehealth: Payer: Self-pay | Admitting: Internal Medicine

## 2013-12-03 NOTE — Telephone Encounter (Signed)
Patient Information:  Caller Name: Butch Penny  Phone: [phone]  Patient: Anthony, Mann  Gender: Male  DOB: 05/12/1946  Age: 68 Years  PCP: Benay Pillow (Adults only)  Office Follow Up:  Does the office need to follow up with this patient?: Yes  Instructions For The Office: please review  RN Note:  Please review and advise.  Call back information given.  Symptoms  Reason For Call & Symptoms: BP med is not controlling BP.  160/90 at home.  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  Pt was started on Avalide on 09/24/13.  Reviewed Health History In EMR: Yes  Reviewed Medications In EMR: Yes  Reviewed Allergies In EMR: Yes  Reviewed Surgeries / Procedures: Yes  Date of Onset of Symptoms: 09/24/2013  Guideline(s) Used:  No Protocol Available - Sick Adult  Disposition Per Guideline:   Discuss with PCP and Callback by Nurse Today  Reason For Disposition Reached:   Nursing judgment  Advice Given:  N/A  Patient Will Follow Care Advice:  YES

## 2013-12-03 NOTE — Telephone Encounter (Signed)
Call pt and have pt see Padonda for hypertension

## 2013-12-04 ENCOUNTER — Ambulatory Visit (INDEPENDENT_AMBULATORY_CARE_PROVIDER_SITE_OTHER): Payer: BC Managed Care – PPO | Admitting: Family

## 2013-12-04 ENCOUNTER — Encounter: Payer: Self-pay | Admitting: Family

## 2013-12-04 VITALS — BP 138/72 | HR 89 | Temp 98.3°F | Ht 65.0 in | Wt 175.0 lb

## 2013-12-04 DIAGNOSIS — I10 Essential (primary) hypertension: Secondary | ICD-10-CM | POA: Diagnosis not present

## 2013-12-04 MED ORDER — IRBESARTAN-HYDROCHLOROTHIAZIDE 300-25 MG PO TABS: 1.0000 | ORAL_TABLET | Freq: Every day | ORAL | Status: DC

## 2013-12-04 NOTE — Progress Notes (Signed)
Subjective:    Patient ID: Anthony Mann, male    DOB: 1945/12/11, 68 y.o.   MRN: [id number]  HPI  68 year old caucasian male, nonsmoker presenting for follow-up for hypertension.  Patient was seen on 09/24/2013 and at that time he was prescribed irbestan-hydrochlorathiazide 300-12.5mg .  He takes his blood pressure everyday after work.  The systolic BP 151-761 and diastolic is 60-73.  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 He doesn't eat salt or fried foods.    Review of Systems  Constitutional: Negative.   HENT: Negative.   Respiratory: Negative.  Negative for cough.   Cardiovascular: Negative.   Gastrointestinal: Negative.   Musculoskeletal: Negative.  Negative for joint swelling.  Skin: Negative.   Neurological: Negative for dizziness.  Psychiatric/Behavioral: Negative.    Past Medical History  Diagnosis Date  . Hypertension   . History of hepatitis B   . Cataract     right eye    History   Social History  . Marital Status: Married    Spouse Name: donna    Number of Children: 2  . Years of Education: N/A   Occupational History  . retired    Social History Main Topics  . Smoking status: Never Smoker   . Smokeless tobacco: Never Used  . Alcohol Use: Yes  . Drug Use: No  . Sexual Activity: Yes    Birth Control/ Protection: None   Other Topics Concern  . Not on file   Social History Narrative  . No narrative on file    Past Surgical History  Procedure Laterality Date  . Eye surgery      right eye cataract  in [place]  . Arm trauma      he had an industrial accident in 1991   . Skin graft full thickness arm      right arm  . Ulnar nerve transposition      in 1991    Family History  Problem Relation Age of Onset  . Cancer Father     colon  . Heart disease Father   . Kidney disease Father     nephectomy  . Heart disease Mother   . Diabetes Mother     No Known Allergies  Current Outpatient Prescriptions on  File Prior to Visit  Medication Sig Dispense Refill  . cyclobenzaprine (FLEXERIL) 10 MG tablet Take 1 tablet (10 mg total) by mouth 2 (two) times daily as needed for muscle spasms.  30 tablet  3  . fish oil-omega-3 fatty acids 1000 MG capsule Take 1 g by mouth daily.        No current facility-administered medications on file prior to visit.    BP 138/72  Pulse 89  Temp(Src) 98.3 F (36.8 C) (Oral)  Ht 5\' 5"  (1.651 m)  Wt 175 lb (79.379 kg)  BMI 29.12 kg/m2  SpO2 98%chart    Objective:   Physical Exam  Constitutional: He is oriented to person, place, and time. He appears well-developed and well-nourished.  Neck: Normal range of motion. Neck supple.  Cardiovascular: Normal rate, regular rhythm and normal heart sounds.   Pulmonary/Chest: Effort normal and breath sounds normal.  Abdominal: Soft.  Musculoskeletal: Normal range of motion. He exhibits no edema.  Neurological: He is alert and oriented to person, place, and time.  Skin: Skin is warm and dry.          Assessment & Plan:  Assessment 1.Increase irbesartan  300mg -hctz 25mg .  2.  Encourage exercise. 3.Reduce sodium and fatty foods.4. Follow-up in one month. 5. Contact office for questions and concerns.

## 2013-12-04 NOTE — Telephone Encounter (Signed)
Pt has been sch for today 

## 2013-12-04 NOTE — Progress Notes (Signed)
Pre visit review using our clinic review tool, if applicable. No additional management support is needed unless otherwise documented below in the visit note. 

## 2013-12-04 NOTE — Patient Instructions (Signed)
Potassium Content of Foods Potassium is a mineral found in many foods and drinks. It helps keep fluids and minerals balanced in your body and also affects how steadily your heart beats. The body needs potassium to control blood pressure and to keep the muscles and nervous system healthy. However, certain health conditions and medicine may require you to eat more or less potassium-rich foods and drinks. Your caregiver or dietitian will tell you how much potassium you should have each day. COMMON SERVING SIZES The list below tells you how big or small common portion sizes are:  1 oz.........4 stacked dice.  3 oz.........Deck of cards.  1 tsp........Tip of little finger.  1 tbsp......Thumb.  2 tbsp......Golf ball.   c...........Half of a fist.  1 c............A fist. FOODS AND DRINKS HIGH IN POTASSIUM More than 200 mg of potassium per serving. A serving size is  c (120 mL or noted gram weight) unless otherwise stated. While all the items on this list are high in potassium, some items are higher in potassium than others. Fruits  Apricots (sliced), 83 g.  Apricots (dried halves), 3 oz / 24 g.  Avocado (cubed),  c / 50 g.  Banana (sliced), 75 g.  Cantaloupe (cubed), 80 g.  Dates (pitted), 5 whole / 35 g.  Figs (dried), 4 whole / 32 g.  Guava, c / 55 g.  Honeydew, 1 wedge / 85 g.  Kiwi (sliced), 90 g.  Nectarine, 1 small / 129 g.  Orange, 1 medium / 131 g.  Orange juice.  Pomegranate seeds, 87 g.  Pomegranate juice.  Prunes (pitted), 3 whole / 30 g.  Prune juice, 3 oz / 90 mL.  Seedless raisins, 3 tbsp / 27 g. Vegetables  Artichoke,  of a medium / 64 g.  Asparagus (boiled), 90 g.  Baked beans,  c / 63 g.  Bamboo shoots,  c / 38 g.  Beets (cooked slices), 85 g.  Broccoli (boiled), 78 g.  Brussels sprout (boiled), 78 g.  Butternut squash (baked), 103 g.  Chickpea (cooked), 82 g.  Green peas (cooked), 80 g.  Hubbard squash (baked cubes),  c /  68 g.  Kidney beans (cooked), 5 tbsp / 55 g.  Lima beans (cooked),  c / 43 g.  Navy beans (cooked),  c / 61 g.  Potato (baked), 61 g.  Potato (boiled), 78 g.  Pumpkin (boiled), 123 g.  Refried beans,  c / 79 g.  Spinach (cooked),  c / 45 g.  Split peas (cooked),  c / 65 g.  Sun-dried tomatoes, 2 tbsp / 7 g.  Sweet potato (baked),  c / 50 g.  Tomato (chopped or sliced), 90 g.  Tomato juice.  Tomato paste, 4 tsp / 21 g.  Tomato sauce,  c / 61 g.  Vegetable juice.  White mushrooms (cooked), 78 g.  Yam (cooked or baked),  c / 34 g.  Zucchini squash (boiled), 90 g. Other Foods and Drinks  Almonds (whole),  c / 36 g.  Cashews (oil roasted),  c / 32 g.  Chocolate milk.  Chocolate pudding, 142 g.  Clams (steamed), 1.5 oz / 43 g.  Dark chocolate, 1.5 oz / 42 g.  Fish, 3 oz / 85 g.  King crab (steamed), 3 oz / 85 g.  Lobster (steamed), 4 oz / 113 g.  Milk (skim, 1%, 2%, whole), 1 c / 240 mL.  Milk chocolate, 2.3 oz / 66 g.  Milk shake.  Nonfat fruit   variety yogurt, 123 g.  Peanuts (oil roasted), 1 oz / 28 g.  Peanut butter, 2 tbsp / 32 g.  Pistachio nuts, 1 oz / 28 g.  Pumpkin seeds, 1 oz / 28 g.  Red meat (broiled, cooked, grilled), 3 oz / 85 g.  Scallops (steamed), 3 oz / 85 g.  Shredded wheat cereal (dry), 3 oblong biscuits / 75 g.  Spaghetti sauce,  c / 66 g.  Sunflower seeds (dry roasted), 1 oz / 28 g.  Veggie burger, 1 patty / 70 g. FOODS MODERATE IN POTASSIUM Between 150 mg and 200 mg per serving. A serving is  c (120 mL or noted gram weight) unless otherwise stated. Fruits  Grapefruit,  of the fruit / 123 g.  Grapefruit juice.  Pineapple juice.  Plums (sliced), 83 g.  Tangerine, 1 large / 120 g. Vegetables  Carrots (boiled), 78 g.  Carrots (sliced), 61 g.  Rhubarb (cooked with sugar), 120 g.  Rutabaga (cooked), 120 g.  Sweet corn (cooked), 75 g.  Yellow snap beans (cooked), 63 g. Other Foods and  Drinks   Bagel, 1 bagel / 98 g.  Chicken breast (roasted and chopped),  c / 70 g.  Chocolate ice cream / 66 g.  Pita bread, 1 large / 64 g.  Shrimp (steamed), 4 oz / 113 g.  Swiss cheese (diced), 70 g.  Vanilla ice cream, 66 g.  Vanilla pudding, 140 g. FOODS LOW IN POTASSIUM Less than 150 mg per serving. A serving size is  cup (120 mL or noted gram weight) unless otherwise stated. If you eat more than 1 serving of a food low in potassium, the food may be considered a food high in potassium. Fruits  Apple (slices), 55 g.  Apple juice.  Applesauce, 122 g.  Blackberries, 72 g.  Blueberries, 74 g.  Cranberries, 50 g.  Cranberry juice.  Fruit cocktail, 119 g.  Fruit punch.  Grapes, 46 g.  Grape juice.  Mandarin oranges (canned), 126 g.  Peach (slices), 77 g.  Pineapple (chunks), 83 g.  Raspberries, 62 g.  Red cherries (without pits), 78 g.  Strawberries (sliced), 83 g.  Watermelon (diced), 76 g. Vegetables  Alfalfa sprouts, 17 g.  Bell peppers (sliced), 46 g.  Cabbage (shredded), 35 g.  Cauliflower (boiled), 62 g.  Celery, 51 g.  Collard greens (boiled), 95 g.  Cucumber (sliced), 52 g.  Eggplant (cubed), 41 g.  Green beans (boiled), 63 g.  Lettuce (shredded), 1 c / 36 g.  Onions (sauteed), 44 g.  Radishes (sliced), 58 g.  Spaghetti squash, 51 g. Other Foods and Drinks  Angel food cake, 1 slice / 28 g.  Black tea.  Brown rice (cooked), 98 g.  Butter croissant, 1 medium / 57 g.  Carbonated soda.  Coffee.  Cheddar cheese (diced), 66 g.  Corn flake cereal (dry), 14 g.  Cottage cheese, 118 g.  Cream of rice cereal (cooked), 122 g.  Cream of wheat cereal (cooked), 126 g.  Crisped rice cereal (dry), 14 g.  Egg (boiled, fried, poached, omelet, scrambled), 1 large / 46 61 g.  English muffin, 1 muffin / 57 g.  Frozen ice pop, 1 pop / 55 g.  Graham cracker, 1 large rectangular cracker / 14 g.  Jelly beans, 112  g.  Non-dairy whipped topping.  Oatmeal, 88 g.  Orange sherbet, 74 g.  Puffed rice cereal (dry), 7 g.  Pasta (cooked), 70 g.  Rice cakes, 4 cakes / 36   g.  Sugared doughnut, 4 oz / 116 g.  White bread, 1 slice / 30 g.  White rice (cooked), 79 93 g.  Wild rice (cooked), 82 g.  Yellow cake, 1 slice / 68 g. Document Released: 04/13/2005 Document Revised: 08/16/2012 Document Reviewed: 01/14/2012 [employer] Patient Information 2014 [employer].  

## 2014-03-25 ENCOUNTER — Ambulatory Visit: Payer: BC Managed Care – PPO | Admitting: Internal Medicine

## 2014-05-18 ENCOUNTER — Other Ambulatory Visit: Payer: Self-pay | Admitting: Family

## 2014-06-27 ENCOUNTER — Ambulatory Visit: Payer: BC Managed Care – PPO | Admitting: Family Medicine

## 2014-07-01 ENCOUNTER — Encounter: Payer: Self-pay | Admitting: Family Medicine

## 2014-07-01 ENCOUNTER — Ambulatory Visit (INDEPENDENT_AMBULATORY_CARE_PROVIDER_SITE_OTHER): Payer: BC Managed Care – PPO | Admitting: Family Medicine

## 2014-07-01 VITALS — BP 120/82 | HR 64 | Temp 97.5°F | Wt 175.0 lb

## 2014-07-01 DIAGNOSIS — Z8619 Personal history of other infectious and parasitic diseases: Secondary | ICD-10-CM | POA: Diagnosis not present

## 2014-07-01 DIAGNOSIS — Z23 Encounter for immunization: Secondary | ICD-10-CM | POA: Diagnosis not present

## 2014-07-01 DIAGNOSIS — E7800 Pure hypercholesterolemia, unspecified: Secondary | ICD-10-CM

## 2014-07-01 DIAGNOSIS — I10 Essential (primary) hypertension: Secondary | ICD-10-CM

## 2014-07-01 DIAGNOSIS — B1910 Unspecified viral hepatitis B without hepatic coma: Secondary | ICD-10-CM | POA: Insufficient documentation

## 2014-07-01 MED ORDER — IRBESARTAN-HYDROCHLOROTHIAZIDE 150-12.5 MG PO TABS: ORAL_TABLET | ORAL | Status: DC

## 2014-07-01 NOTE — Patient Instructions (Addendum)
Great to meet you!   Blood pressure looks great! Refilled meds  Come back for fasting labs in the next few weeks. We will call you or send mychart message with results.   Flu shot today

## 2014-07-01 NOTE — Addendum Note (Signed)
Addended by: Marin Olp on: 07/01/2014 01:41 PM   Modules accepted: Level of Service

## 2014-07-01 NOTE — Assessment & Plan Note (Addendum)
Well controlled. Refilled irbesartan-hctz at 300-25mg  dose. F/u 6 months. Calculate 10 year cardiac risk with lipids future fasting.

## 2014-07-01 NOTE — Progress Notes (Signed)
Anthony Reddish, MD Phone: [phone]  Subjective:  Patient presents today to establish care with me as their new primary care provider. Patient was formerly a patient of Dr. Arnoldo Morale. Chief complaint-noted.   Hypertension-well controlled  BP Readings from Last 3 Encounters:  07/01/14 120/82  12/04/13 138/72  09/24/13 160/90  Compliant with medications-yes without side effects ROS-Denies any CP, HA, SOB, blurry vision, LE edema.  Does have some erectile dysfunction. Takes ibuprofen up to 600mg  when needed maybe once a week for mild right groin pain. 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   History of hepatitis B-from history appears to have cleared Told was cleared by GI in [place] and has had no follow up since that time. No copy of labs in our system  The following were reviewed and entered/updated in epic: Past Medical History  Diagnosis Date  . Hypertension   . History of hepatitis B   . Cataract     right eye in [place], left here 10/2013   Patient Active Problem List   Diagnosis Date Noted  . History of hepatitis B   . HTN (hypertension) 10/14/2010   Past Surgical History  Procedure Laterality Date  . Eye surgery      right eye cataract  in [place]  . Arm trauma      he had an industrial accident in 1991   . Skin graft full thickness arm      right arm  . Ulnar nerve transposition      in 1991    Family History  Problem Relation Age of Onset  . Cancer Father     colon  . Heart disease Father 59  . Kidney disease Father     nephectomy  . Diabetes Mother 29    Medications- reviewed and updated Current Outpatient Prescriptions  Medication Sig Dispense Refill  . fish oil-omega-3 fatty acids 1000 MG capsule Take 1 g by mouth daily.       . irbesartan-hydrochlorothiazide (AVALIDE) 150-12.5 MG per tablet TAKE 2 TABLETS BY MOUTH ONCE DAILY  180 tablet  3  . cyclobenzaprine (FLEXERIL) 10 MG tablet  Take 1 tablet (10 mg total) by mouth 2 (two) times daily as needed for muscle spasms.  30 tablet  3   No current facility-administered medications for this visit.    Allergies-reviewed and updated No Known Allergies  History   Social History  . Marital Status: Married    Spouse Name: donna    Number of Children: 2  . Years of Education: N/A   Occupational History  . retired    Social History Main Topics  . Smoking status: Never Smoker   . Smokeless tobacco: Never Used  . Alcohol Use: 1.5 oz/week    3 drink(s) per week  . Drug Use: No  . Sexual Activity: Yes    Birth Control/ Protection: None   Other Topics Concern  . None   Social History Narrative   Moved to [place] in 2011 from [place] due to more affordable.       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       Married 1972 [place] 2 kids. 5 grandkids. 2 in [place], 3 [place]. Son coaches at [employer] for soccer team and is a Writer from there.       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  Ext: no edema, 2+ PT pulses Skin: warm, dry, several picked areas at head (encouraged hat and will need regular monitoring as possible underlying actinic keratosis) Neuro: grossly normal, moves all extremities, PERRLA  Assessment/Plan:  HTN (hypertension) Well controlled. Refilled irbesartan-hctz at 300-25mg  dose. F/u 6 months. Calculate 10 year cardiac risk with lipids future fasting.   History of hepatitis B Update labs today at least to reflect in our system a hopefully  cleared Hep B infection.    Future fasting labs Orders Placed This Encounter  Procedures  . CBC    [place]    Standing Status: Future     Number of Occurrences:      Standing Expiration Date: 09/12/2014  . Comprehensive metabolic panel    [place]    Standing Status: Future     Number of Occurrences:      Standing Expiration Date: 09/12/2014  . Lipid panel    [place]    Standing Status: Future     Number of Occurrences:      Standing Expiration Date: 09/12/2014  . TSH    [place]    Standing Status: Future     Number of Occurrences:      Standing Expiration Date: 09/12/2014  . Hep B Surface Antigen    Standing Status: Future     Number of Occurrences:      Standing Expiration Date: 09/12/2014  . Hepatitis B core antibody, IgM    Standing Status: Future     Number of Occurrences:      Standing Expiration Date: 09/12/2014  . Hepatitis B core antibody, total    Standing Status: Future     Number of Occurrences:      Standing Expiration Date: 09/12/2014  . Hep B Surface Antibody    Standing Status: Future     Number of Occurrences:      Standing Expiration Date: 09/12/2014    Meds ordered this encounter  Medications  . irbesartan-hydrochlorothiazide (AVALIDE) 150-12.5 MG per tablet    Sig: TAKE 2 TABLETS BY MOUTH ONCE DAILY    Dispense:  180 tablet    Refill:  3

## 2014-07-01 NOTE — Assessment & Plan Note (Signed)
Update labs today at least to reflect in our system a hopefully cleared Hep B infection.

## 2014-07-15 ENCOUNTER — Other Ambulatory Visit (INDEPENDENT_AMBULATORY_CARE_PROVIDER_SITE_OTHER): Payer: BC Managed Care – PPO

## 2014-07-15 DIAGNOSIS — E7800 Pure hypercholesterolemia, unspecified: Secondary | ICD-10-CM

## 2014-07-15 DIAGNOSIS — I10 Essential (primary) hypertension: Secondary | ICD-10-CM | POA: Diagnosis not present

## 2014-07-15 DIAGNOSIS — Z8619 Personal history of other infectious and parasitic diseases: Secondary | ICD-10-CM | POA: Diagnosis not present

## 2014-07-15 LAB — COMPREHENSIVE METABOLIC PANEL WITH GFR
ALT: 19 U/L (ref 0–53)
AST: 23 U/L (ref 0–37)
Albumin: 4.1 g/dL (ref 3.5–5.2)
Alkaline Phosphatase: 35 U/L — ABNORMAL LOW (ref 39–117)
BUN: 17 mg/dL (ref 6–23)
CO2: 26 meq/L (ref 19–32)
Calcium: 9.8 mg/dL (ref 8.4–10.5)
Chloride: 101 meq/L (ref 96–112)
Creatinine, Ser: 1.1 mg/dL (ref 0.4–1.5)
GFR: 74.85 mL/min
Glucose, Bld: 90 mg/dL (ref 70–99)
Potassium: 4 meq/L (ref 3.5–5.1)
Sodium: 137 meq/L (ref 135–145)
Total Bilirubin: 1.4 mg/dL — ABNORMAL HIGH (ref 0.2–1.2)
Total Protein: 7.7 g/dL (ref 6.0–8.3)

## 2014-07-15 LAB — LIPID PANEL
Cholesterol: 209 mg/dL — ABNORMAL HIGH (ref 0–200)
HDL: 56.1 mg/dL (ref >39.00)
LDL Cholesterol: 120 mg/dL — ABNORMAL HIGH (ref 0–99)
NonHDL: 152.9
Total CHOL/HDL Ratio: 4
Triglycerides: 165 mg/dL — ABNORMAL HIGH (ref 0.0–149.0)
VLDL: 33 mg/dL (ref 0.0–40.0)

## 2014-07-15 LAB — TSH: TSH: 1.1 u[IU]/mL (ref 0.35–4.50)

## 2014-07-15 LAB — CBC
HCT: 48.4 % (ref 39.0–52.0)
Hemoglobin: 16.1 g/dL (ref 13.0–17.0)
MCHC: 33.2 g/dL (ref 30.0–36.0)
MCV: 97.8 fl (ref 78.0–100.0)
Platelets: 234 K/uL (ref 150.0–400.0)
RBC: 4.95 Mil/uL (ref 4.22–5.81)
RDW: 12.5 % (ref 11.5–15.5)
WBC: 6.9 K/uL (ref 4.0–10.5)

## 2014-07-16 ENCOUNTER — Telehealth: Payer: Self-pay | Admitting: Family Medicine

## 2014-07-16 DIAGNOSIS — Z8619 Personal history of other infectious and parasitic diseases: Secondary | ICD-10-CM

## 2014-07-16 DIAGNOSIS — E785 Hyperlipidemia, unspecified: Secondary | ICD-10-CM | POA: Insufficient documentation

## 2014-07-16 LAB — HEPATITIS B CORE ANTIBODY, TOTAL: HEP B C TOTAL AB: REACTIVE — AB

## 2014-07-16 LAB — HEPATITIS B CORE ANTIBODY, IGM: Hep B C IgM: NONREACTIVE

## 2014-07-16 LAB — HEPATITIS B SURFACE ANTIGEN: Hepatitis B Surface Ag: POSITIVE — AB

## 2014-07-16 LAB — HEPATITIS B SURF AG CONFIRMATION: Hepatitis B Surf Ag Confirmation: POSITIVE — AB

## 2014-07-16 LAB — HEPATITIS B SURFACE ANTIBODY,QUALITATIVE: Hep B S Ab: NEGATIVE

## 2014-07-16 NOTE — Telephone Encounter (Signed)
Reviewed labs with patient and informed of my interpretation of labs being chronic hepatitis B infection-refer to ID

## 2014-07-16 NOTE — Assessment & Plan Note (Signed)
My interpretation of labs is chronic hepatitis B. We discussed options by phone of follow up HBeAg and HBV DNA testing vs. Referral to infectious disease. Joint decision made for ID referral.

## 2014-08-12 ENCOUNTER — Ambulatory Visit (INDEPENDENT_AMBULATORY_CARE_PROVIDER_SITE_OTHER): Payer: BC Managed Care – PPO | Admitting: Infectious Diseases

## 2014-08-12 ENCOUNTER — Encounter: Payer: Self-pay | Admitting: Infectious Diseases

## 2014-08-12 VITALS — BP 162/92 | HR 74 | Temp 97.7°F | Wt 176.0 lb

## 2014-08-12 DIAGNOSIS — Z113 Encounter for screening for infections with a predominantly sexual mode of transmission: Secondary | ICD-10-CM

## 2014-08-12 DIAGNOSIS — B181 Chronic viral hepatitis B without delta-agent: Secondary | ICD-10-CM | POA: Diagnosis not present

## 2014-08-12 DIAGNOSIS — Z114 Encounter for screening for human immunodeficiency virus [HIV]: Secondary | ICD-10-CM

## 2014-08-12 DIAGNOSIS — Z1159 Encounter for screening for other viral diseases: Secondary | ICD-10-CM | POA: Diagnosis not present

## 2014-08-12 NOTE — Assessment & Plan Note (Addendum)
It seems he is well controlled, compensated. His LFTs are normal. Will check his Hep B eAg and DNA. If the DNA is positive, will precede to elastogram, to eval for fibrosis. These will help to determine if he needs therapy.  Will see him back in 2 weeks.

## 2014-08-12 NOTE — Progress Notes (Signed)
   Subjective:    Patient ID: Anthony Mann, male    DOB: 04-04-1946, 68 y.o.   MRN: [id number]  HPI 68 yo M with hx of HTN, hx of "industrial accident" with skin grafting R forearm (1991). 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  Served in [place], also in [place], [place]. No hx of blood transfusion, no tattoos. No hx of drug use.  Still has ab's for hepatitis B. Has some question that his urine has been darker, no hx of icterus. No change in stool color.   PMhx, Fhx, Sochx, reviewed.   Review of Systems  Constitutional: Negative for fever, chills, appetite change and unexpected weight change.  Gastrointestinal: Negative for diarrhea, constipation and blood in stool.  Genitourinary: Negative for difficulty urinating.  Musculoskeletal: Positive for back pain.       Objective:   Physical Exam  Constitutional: He appears well-developed and well-nourished.  HENT:  Mouth/Throat: No oropharyngeal exudate.  Eyes: EOM are normal. Pupils are equal, round, and reactive to light.  Neck: Neck supple.  Cardiovascular: Normal rate, regular rhythm and normal heart sounds.   Pulmonary/Chest: Effort normal and breath sounds normal.  Abdominal: Soft. Bowel sounds are normal. He exhibits no distension. There is no hepatosplenomegaly. There is no tenderness.  Musculoskeletal: He exhibits no edema or tenderness.  Lymphadenopathy:    He has no cervical adenopathy.          Assessment & Plan:

## 2014-08-13 LAB — HEPATITIS C ANTIBODY: HCV Ab: NEGATIVE

## 2014-08-13 LAB — HIV ANTIBODY (ROUTINE TESTING W REFLEX): HIV: NONREACTIVE

## 2014-08-13 LAB — HEPATITIS A ANTIBODY, TOTAL: Hep A Total Ab: REACTIVE — AB

## 2014-08-14 LAB — HEPATITIS B E ANTIGEN: HEPATITIS BE ANTIGEN: NONREACTIVE

## 2014-08-22 ENCOUNTER — Telehealth: Payer: Self-pay | Admitting: *Deleted

## 2014-08-22 NOTE — Telephone Encounter (Signed)
Anthony Mann called from the [facility] stating she received labs from [facility] on a positive Hepatitis B and wanted to see if the pt has been informed and treated.  I advised her Dr Ansel Bong note on 11/3 he informed the pt and referred him to ID-Dr Johnnye Sima and the pt initially was seen to establish care and has a history of chronic Hepatitis B.

## 2014-09-02 ENCOUNTER — Encounter: Payer: Self-pay | Admitting: Infectious Diseases

## 2014-09-02 ENCOUNTER — Ambulatory Visit (INDEPENDENT_AMBULATORY_CARE_PROVIDER_SITE_OTHER): Payer: BC Managed Care – PPO | Admitting: Infectious Diseases

## 2014-09-02 VITALS — BP 158/89 | HR 76 | Temp 97.7°F | Wt 174.0 lb

## 2014-09-02 DIAGNOSIS — B162 Acute hepatitis B without delta-agent with hepatic coma: Secondary | ICD-10-CM

## 2014-09-02 DIAGNOSIS — B1911 Unspecified viral hepatitis B with hepatic coma: Secondary | ICD-10-CM

## 2014-09-02 LAB — COMPREHENSIVE METABOLIC PANEL
ALK PHOS: 42 U/L (ref 39–117)
ALT: 18 U/L (ref 0–53)
AST: 19 U/L (ref 0–37)
Albumin: 4.6 g/dL (ref 3.5–5.2)
BILIRUBIN TOTAL: 0.8 mg/dL (ref 0.2–1.2)
BUN: 24 mg/dL — AB (ref 6–23)
CO2: 27 mEq/L (ref 19–32)
Calcium: 9.5 mg/dL (ref 8.4–10.5)
Chloride: 98 mEq/L (ref 96–112)
Creat: 0.97 mg/dL (ref 0.50–1.35)
GLUCOSE: 94 mg/dL (ref 70–99)
Potassium: 4.3 mEq/L (ref 3.5–5.3)
Sodium: 138 mEq/L (ref 135–145)
Total Protein: 7 g/dL (ref 6.0–8.3)

## 2014-09-02 NOTE — Assessment & Plan Note (Addendum)
Will check his Hep B DNA today. Not clear why this was not done at his last visit.  He has gotten a letter from the health dept which he was concerned about, we called them today and went through the questionaire together.  His wife needs to be screened.  Will see him back in 3 months.

## 2014-09-02 NOTE — Progress Notes (Signed)
   Subjective:    Patient ID: Anthony Mann, male    DOB: 29-Jan-1946, 67 y.o.   MRN: [id number]  HPI 68 yo M with hx of HTN, hx of "industrial accident" with skin grafting R forearm (1991). 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  Served in [place], also in [place], [place]. No hx of blood transfusion, no tattoos. No hx of drug use.   Has been feeling well, family coming into town for holidays.  No change in wt, no icterus, no change in BM or urine.   Review of Systems     Objective:   Physical Exam  Constitutional: He appears well-developed and well-nourished.  Cardiovascular: Normal rate, regular rhythm and normal heart sounds.   Pulmonary/Chest: Effort normal and breath sounds normal.  Abdominal: Soft. Bowel sounds are normal. He exhibits no distension. There is no tenderness.          Assessment & Plan:

## 2014-09-04 LAB — HEPATITIS B DNA, ULTRAQUANTITATIVE, PCR
Hepatitis B DNA (Calc): 4435 copies/mL — ABNORMAL HIGH (ref <116)
Hepatitis B DNA: 762 IU/mL — ABNORMAL HIGH (ref <20)

## 2014-11-29 ENCOUNTER — Telehealth: Payer: Self-pay | Admitting: Family Medicine

## 2014-11-29 DIAGNOSIS — M25561 Pain in right knee: Secondary | ICD-10-CM

## 2014-11-29 NOTE — Telephone Encounter (Signed)
Pt fell down a couple of days ago and hurt his right knee.  Would like referral tto [facility], dr Maureen Ralphs. [phone]

## 2014-12-02 ENCOUNTER — Ambulatory Visit: Payer: BC Managed Care – PPO | Admitting: Infectious Diseases

## 2014-12-02 ENCOUNTER — Telehealth: Payer: Self-pay

## 2014-12-02 DIAGNOSIS — M25561 Pain in right knee: Secondary | ICD-10-CM

## 2014-12-02 NOTE — Telephone Encounter (Signed)
Is this ok to enter?

## 2014-12-02 NOTE — Telephone Encounter (Signed)
Ortho referral entered

## 2014-12-02 NOTE — Telephone Encounter (Signed)
Referral has been placed. 

## 2014-12-02 NOTE — Telephone Encounter (Signed)
Yes fine under R knee pain. I am also happy to see him if desired as first step.

## 2014-12-04 ENCOUNTER — Encounter: Payer: Self-pay | Admitting: Infectious Diseases

## 2014-12-04 ENCOUNTER — Ambulatory Visit (INDEPENDENT_AMBULATORY_CARE_PROVIDER_SITE_OTHER): Payer: BLUE CROSS/BLUE SHIELD | Admitting: Infectious Diseases

## 2014-12-04 VITALS — BP 133/80 | HR 84 | Temp 98.2°F | Ht 66.0 in | Wt 175.0 lb

## 2014-12-04 DIAGNOSIS — B181 Chronic viral hepatitis B without delta-agent: Secondary | ICD-10-CM | POA: Diagnosis not present

## 2014-12-04 NOTE — Assessment & Plan Note (Signed)
We reviewed his labs, he is Hep B S Ag+, E Ag-. His VL is 762/4435.  By the up to date treatment algorithm, he does not meet criteria for treatment (his only + is that one of his two VL is over 2k). i offered that her return in 1 year for repeat labs, needs to have u/s as well.  I can do this at our clinic or at his PCP.

## 2014-12-04 NOTE — Progress Notes (Signed)
   Subjective:    Patient ID: Anthony Mann, male    DOB: 1946/03/12, 69 y.o.   MRN: [id number]  HPI 69 yo M with hx of HTN, hx of "industrial accident" with skin grafting R forearm (1991). 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  Served in [place], also in [place], [place]. No hx of blood transfusion, no tattoos. No hx of drug use.   He has been feeling well, tired from being up all night with work.   Review of Systems     Objective:   Physical Exam  Constitutional: He appears well-developed and well-nourished.          Assessment & Plan:

## 2014-12-23 ENCOUNTER — Ambulatory Visit (INDEPENDENT_AMBULATORY_CARE_PROVIDER_SITE_OTHER): Payer: BLUE CROSS/BLUE SHIELD | Admitting: Family Medicine

## 2014-12-23 ENCOUNTER — Encounter: Payer: Self-pay | Admitting: Family Medicine

## 2014-12-23 VITALS — BP 130/88 | HR 77 | Temp 98.0°F | Wt 176.0 lb

## 2014-12-23 DIAGNOSIS — E785 Hyperlipidemia, unspecified: Secondary | ICD-10-CM

## 2014-12-23 DIAGNOSIS — B181 Chronic viral hepatitis B without delta-agent: Secondary | ICD-10-CM | POA: Diagnosis not present

## 2014-12-23 DIAGNOSIS — I10 Essential (primary) hypertension: Secondary | ICD-10-CM | POA: Diagnosis not present

## 2014-12-23 MED ORDER — IRBESARTAN-HYDROCHLOROTHIAZIDE 150-12.5 MG PO TABS: ORAL_TABLET | ORAL | Status: DC

## 2014-12-23 NOTE — Assessment & Plan Note (Signed)
S: 07/2014 labs-14.8% 10 year risk. He is aware of cardiac risks.  A/P: Patient declines statin. States he will likely take after his 1 year follow up if ultrasound, LFTs, viral load stable.

## 2014-12-23 NOTE — Patient Instructions (Signed)
BP looks fantastic. Keep an eye on it at home and see me if it gets above 140/90 on a regular basis. See me next March for an annual wellness visit (30 minute visit).   We will plan on checking your liver function and status of Hep B and getting an ultrasound of your liver when I see you back in March of 2017.   You wanted to hold off on cholesterol medication until the next evaluation. We would change our plan if you were to have any events but I am hopeful you will not.   Let's have you take an aspirin 81mg  every other day

## 2014-12-23 NOTE — Progress Notes (Signed)
  Garret Reddish, MD Phone: [phone]  Subjective:   Anthony Mann is a 69 y.o. year old very pleasant male patient who presents with the following:  See Problem oriented charting.  ROS-Denies any CP, HA, SOB, blurry vision, LE edema. No RUQ pain. No myalgias  Past Medical History- Patient Active Problem List   Diagnosis Date Noted  . Hyperlipidemia 07/16/2014  . Hepatitis B   . HTN (hypertension) 10/14/2010   Medications- reviewed and updated Current Outpatient Prescriptions  Medication Sig Dispense Refill  . cyclobenzaprine (FLEXERIL) 10 MG tablet Take 1 tablet (10 mg total) by mouth 2 (two) times daily as needed for muscle spasms. (Patient not taking: Reported on 12/23/2014) 30 tablet 3  . fish oil-omega-3 fatty acids 1000 MG capsule Take 1 g by mouth daily.     . irbesartan-hydrochlorothiazide (AVALIDE) 150-12.5 MG per tablet TAKE 2 TABLETS BY MOUTH ONCE DAILY 180 tablet 3  . Naproxen Sodium (ALEVE PO) Take by mouth 2 (two) times daily as needed.     Objective: BP 130/88 mmHg  Pulse 77  Temp(Src) 98 F (36.7 C)  Wt 176 lb (79.833 kg) Gen: NAD, resting comfortably, wearing back brace CV: RRR no murmurs rubs or gallops Lungs: CTAB no crackles, wheeze, rhonchi Abdomen: soft/nontender/nondistended/normal bowel sounds. No hepatosplenomegaly Ext: no edema Skin: warm, dry, no rash Neuro: grossly normal, moves all extremities  Assessment/Plan:  HTN (hypertension) Controlled on irbesartan-hctz 150-12.5mg  BID BP Readings from Last 3 Encounters:  12/23/14 130/88  12/04/14 133/80  09/02/14 158/89   Home BP monitoring-120s and 130s vast majority of time, occasionally 140 but rare. Diastolic controlled A/P: excellent control. Continue meds    Hepatitis B S:Chronic. Has had eval by ID and treatment not recommended (does not meet current guidelines) A/P:Check viral load and LFTs and RUQ U/S at 1 year follow up. Has been cleared by ID for follow up with primary care.      Hyperlipidemia S: 07/2014 labs-14.8% 10 year risk. He is aware of cardiac risks.  A/P: Patient declines statin. States he will likely take after his 1 year follow up if ultrasound, LFTs, viral load stable.     March 2017 follow up [facility]-regular labs + RUQ u/s + viral load Hep B  Meds ordered this encounter  Medications  . irbesartan-hydrochlorothiazide (AVALIDE) 150-12.5 MG per tablet    Sig: TAKE 2 TABLETS BY MOUTH ONCE DAILY    Dispense:  180 tablet    Refill:  3

## 2014-12-23 NOTE — Assessment & Plan Note (Addendum)
S:Chronic. Has had eval by ID and treatment not recommended (does not meet current guidelines) A/P:Check viral load and LFTs and RUQ U/S at 1 year follow up. Has been cleared by ID for follow up with primary care.

## 2014-12-23 NOTE — Assessment & Plan Note (Signed)
Controlled on irbesartan-hctz 150-12.5mg  BID BP Readings from Last 3 Encounters:  12/23/14 130/88  12/04/14 133/80  09/02/14 158/89   Home BP monitoring-120s and 130s vast majority of time, occasionally 140 but rare. Diastolic controlled A/P: excellent control. Continue meds

## 2015-01-09 ENCOUNTER — Other Ambulatory Visit (HOSPITAL_COMMUNITY): Payer: Self-pay | Admitting: Chiropractic Medicine

## 2015-01-09 ENCOUNTER — Ambulatory Visit (HOSPITAL_COMMUNITY)
Admission: RE | Admit: 2015-01-09 | Discharge: 2015-01-09 | Disposition: A | Discharge status: Home / Self Care | Payer: Medicare Other | Source: Ambulatory Visit | Attending: Chiropractic Medicine | Admitting: Chiropractic Medicine

## 2015-01-09 DIAGNOSIS — M5134 Other intervertebral disc degeneration, thoracic region: Secondary | ICD-10-CM | POA: Insufficient documentation

## 2015-01-09 DIAGNOSIS — M62830 Muscle spasm of back: Secondary | ICD-10-CM | POA: Diagnosis not present

## 2015-01-09 DIAGNOSIS — M545 Low back pain: Secondary | ICD-10-CM

## 2015-01-09 DIAGNOSIS — M5136 Other intervertebral disc degeneration, lumbar region: Secondary | ICD-10-CM | POA: Diagnosis not present

## 2015-01-09 IMAGING — CR DG THORACOLUMBAR SPINE 2V
2 series · 2 of 2 positions shown · non-contrast
Comparison: None.

CLINICAL DATA: Bilateral mid back spasm for the past 2 months
without known injury

EXAM:
THORACOLUMBAR SPINE 1V

[w t-spine/l-spine junc. ap *]
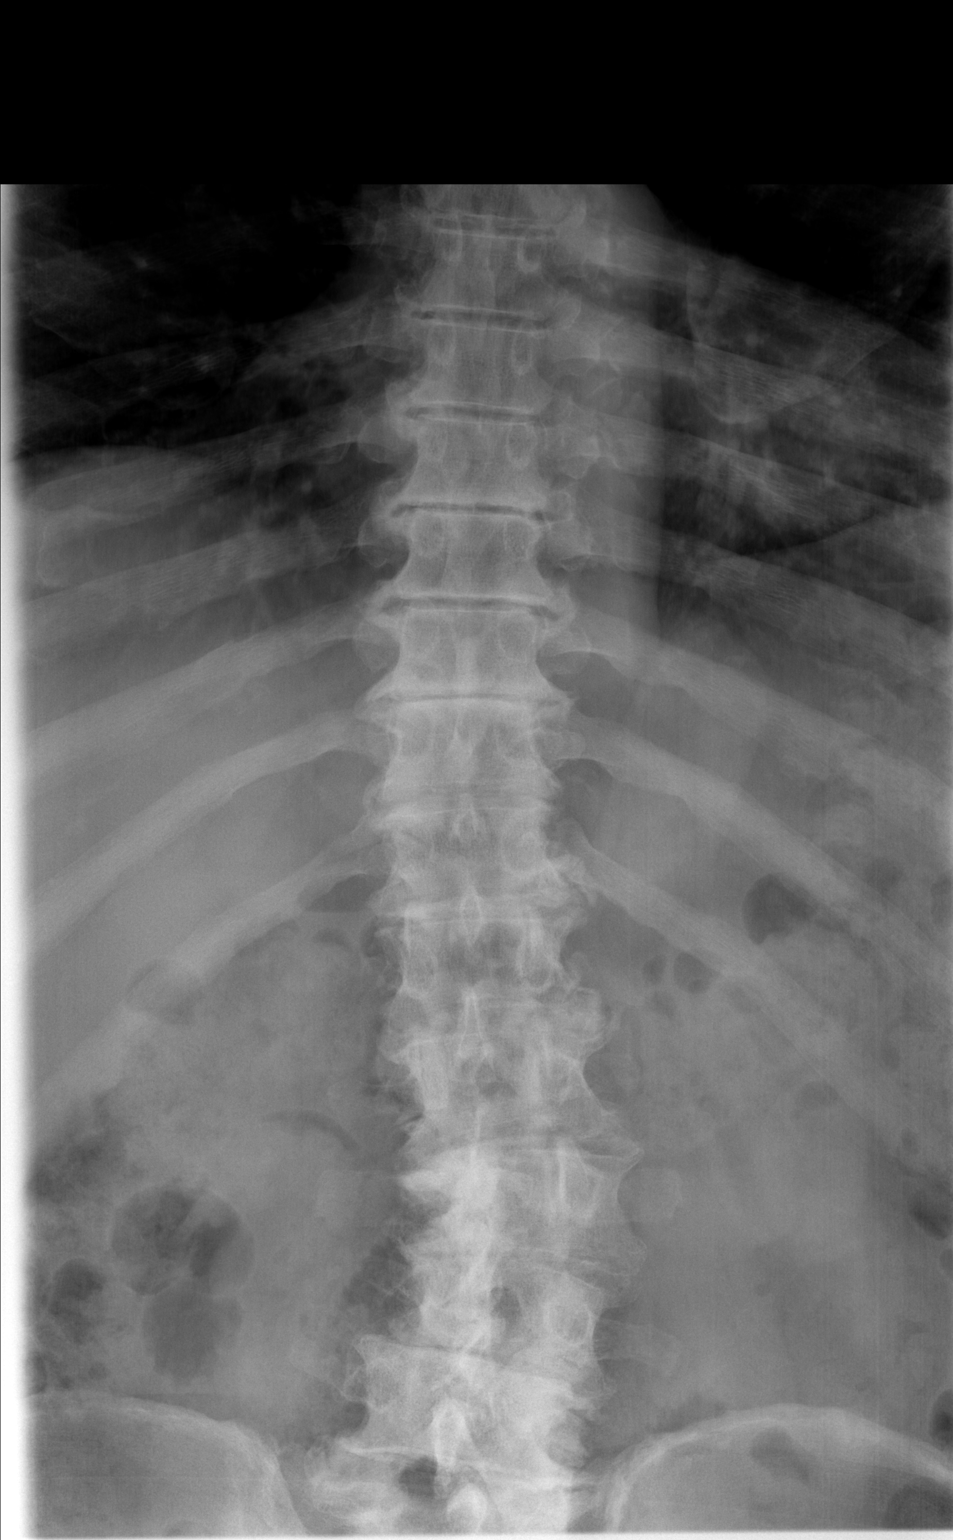

[w t-spine/l-spine junc lat *]
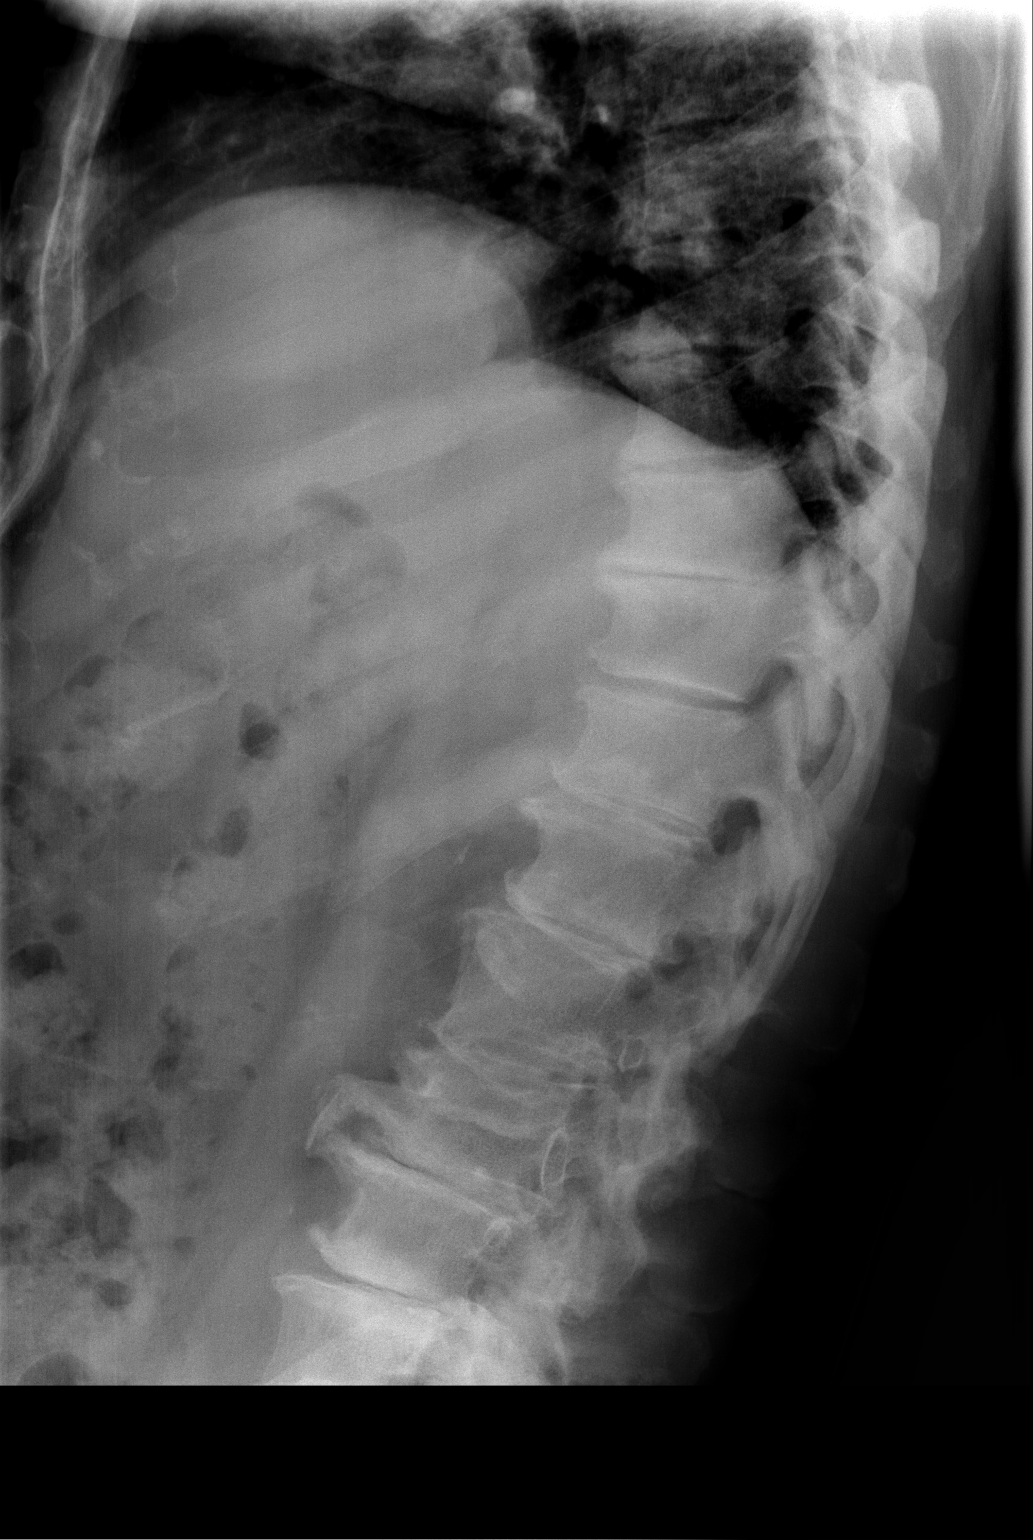

[2 of 2 positions shown; findings below may reference images not displayed]

FINDINGS: AP and lateral views including T4 -S1 reveal the vertebral bodies to
be preserved in height. There is multilevel degenerative disc space
narrowing. There is a large anterior endplate osteophyte at L3-4
with smaller osteophytes elsewhere in the lumbar spine. There is
multilevel lumbar facet joint hypertrophy. There is no
spondylolisthesis. There is moderate levo-curvature of the lumbar
spine centered at L3.
IMPRESSION: There is no compression fracture of the visualized portions of the
thoracic or lumbar spine. There is mild multilevel degenerative disc
change of the thoracic spine. There is moderate multilevel
degenerative disc and facet joint change of the lumbar spine with
mild levocurvature centered at L3.

## 2015-08-18 ENCOUNTER — Ambulatory Visit (INDEPENDENT_AMBULATORY_CARE_PROVIDER_SITE_OTHER): Payer: Medicare Other | Admitting: Family Medicine

## 2015-08-18 DIAGNOSIS — Z23 Encounter for immunization: Secondary | ICD-10-CM

## 2015-10-27 ENCOUNTER — Ambulatory Visit (INDEPENDENT_AMBULATORY_CARE_PROVIDER_SITE_OTHER): Payer: BLUE CROSS/BLUE SHIELD | Admitting: Family Medicine

## 2015-10-27 ENCOUNTER — Encounter: Payer: Self-pay | Admitting: Family Medicine

## 2015-10-27 VITALS — BP 128/80 | HR 79 | Temp 97.8°F | Ht 66.0 in | Wt 171.0 lb

## 2015-10-27 DIAGNOSIS — H8110 Benign paroxysmal vertigo, unspecified ear: Secondary | ICD-10-CM | POA: Diagnosis not present

## 2015-10-27 MED ORDER — IRBESARTAN-HYDROCHLOROTHIAZIDE 150-12.5 MG PO TABS: ORAL_TABLET | ORAL | Status: DC

## 2015-10-27 NOTE — Progress Notes (Signed)
Pre visit review using our clinic review tool, if applicable. No additional management support is needed unless otherwise documented below in the visit note. 

## 2015-10-27 NOTE — Patient Instructions (Signed)
Benign Positional Vertigo Vertigo is the feeling that you or your surroundings are moving when they are not. Benign positional vertigo is the most common form of vertigo. The cause of this condition is not serious (is benign). This condition is triggered by certain movements and positions (is positional). This condition can be dangerous if it occurs while you are doing something that could endanger you or others, such as driving.  CAUSES In many cases, the cause of this condition is not known. It may be caused by a disturbance in an area of the inner ear that helps your brain to sense movement and balance. This disturbance can be caused by a viral infection (labyrinthitis), head injury, or repetitive motion. RISK FACTORS This condition is more likely to develop in:  Women.  People who are 50 years of age or older. SYMPTOMS Symptoms of this condition usually happen when you move your head or your eyes in different directions. Symptoms may start suddenly, and they usually last for less than a minute. Symptoms may include:  Loss of balance and falling.  Feeling like you are spinning or moving.  Feeling like your surroundings are spinning or moving.  Nausea and vomiting.  Blurred vision.  Dizziness.  Involuntary eye movement (nystagmus). Symptoms can be mild and cause only slight annoyance, or they can be severe and interfere with daily life. Episodes of benign positional vertigo may return (recur) over time, and they may be triggered by certain movements. Symptoms may improve over time. DIAGNOSIS This condition is usually diagnosed by medical history and a physical exam of the head, neck, and ears. You may be referred to a health care provider who specializes in ear, nose, and throat (ENT) problems (otolaryngologist) or a provider who specializes in disorders of the nervous system (neurologist). You may have additional testing, including:  MRI.  A CT scan.  Eye movement tests. Your  health care provider may ask you to change positions quickly while he or she watches you for symptoms of benign positional vertigo, such as nystagmus. Eye movement may be tested with an electronystagmogram (ENG), caloric stimulation, the Dix-Hallpike test, or the roll test.  An electroencephalogram (EEG). This records electrical activity in your brain.  Hearing tests. TREATMENT Usually, your health care provider will treat this by moving your head in specific positions to adjust your inner ear back to normal. Surgery may be needed in severe cases, but this is rare. In some cases, benign positional vertigo may resolve on its own in 2-4 weeks. HOME CARE INSTRUCTIONS Safety  Move slowly.Avoid sudden body or head movements.  Avoid driving.  Avoid operating heavy machinery.  Avoid doing any tasks that would be dangerous to you or others if a vertigo episode would occur.  If you have trouble walking or keeping your balance, try using a cane for stability. If you feel dizzy or unstable, sit down right away.  Return to your normal activities as told by your health care provider. Ask your health care provider what activities are safe for you. General Instructions  Take over-the-counter and prescription medicines only as told by your health care provider.  Avoid certain positions or movements as told by your health care provider.  Drink enough fluid to keep your urine clear or pale yellow.  Keep all follow-up visits as told by your health care provider. This is important. SEEK MEDICAL CARE IF:  You have a fever.  Your condition gets worse or you develop new symptoms.  Your family or friends   notice any behavioral changes.  Your nausea or vomiting gets worse.  You have numbness or a "pins and needles" sensation. SEEK IMMEDIATE MEDICAL CARE IF:  You have difficulty speaking or moving.  You are always dizzy.  You faint.  You develop severe headaches.  You have weakness in your  legs or arms.  You have changes in your hearing or vision.  You develop a stiff neck.  You develop sensitivity to light.   This information is not intended to replace advice given to you by your health care provider. Make sure you discuss any questions you have with your health care provider.   Document Released: 06/07/2006 Document Revised: 05/21/2015 Document Reviewed: 12/23/2014 Elsevier Interactive Patient Education 2016 [employer]  

## 2015-10-27 NOTE — Progress Notes (Signed)
   Subjective:    Patient ID: Anthony Mann, male    DOB: 05/09/1946, 70 y.o.   MRN: [id number]  HPI Acute Visit  Patient seen today with a complaint of dizziness times three days. Past medical history of hypertension and hyperlipidemia.  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 Symptoms continued most of day on Saturday. Patient denies headache, facial weakness, speech alterations, or hearing loss. Today he reports an improvement of nausea with residual dizziness still present.  Symptoms exacerbated by movement.    Past Medical History  Diagnosis Date  . Hypertension   . History of hepatitis B   . Cataract     right eye in [place], left here 10/2013   Past Surgical History  Procedure Laterality Date  . Eye surgery      right eye cataract  in [place]  . Arm trauma      he had an industrial accident in 1991   . Skin graft full thickness arm      right arm  . Ulnar nerve transposition      in 1991    reports that he has never smoked. He has never used smokeless tobacco. He reports that he drinks about 1.8 oz of alcohol per week. He reports that he does not use illicit drugs. family history includes Cancer in his father; Diabetes (age of onset: 33) in his mother; Heart disease (age of onset: 79) in his father; Kidney disease in his father. No Known Allergies    Review of Systems  Constitutional: Negative.   HENT: Negative for congestion, ear discharge, ear pain, facial swelling, hearing loss, sinus pressure, tinnitus and voice change.   Eyes: Negative.   Respiratory: Negative.   Cardiovascular: Negative.   Gastrointestinal: Negative.   Endocrine: Negative.   Genitourinary: Negative.   Musculoskeletal: Negative.   Skin: Negative.   Allergic/Immunologic: Negative.   Neurological: Positive for dizziness. Negative for weakness.  Hematological: Negative.   Psychiatric/Behavioral: Negative.        Objective:   Physical Exam  Constitutional: He is oriented to person, place, and time. He appears well-developed and well-nourished.  HENT:  Head: Normocephalic.  Eyes: Pupils are equal, round, and reactive to light.  Neck: Normal range of motion.  Cardiovascular: Normal rate, regular rhythm, normal heart sounds and intact distal pulses.   Pulmonary/Chest: Effort normal and breath sounds normal.  Musculoskeletal: Normal range of motion.  Neurological: He is alert and oriented to person, place, and time. He has normal reflexes. No cranial nerve deficit.  Skin: Skin is warm.  Psychiatric: He has a normal mood and affect. His behavior is normal. Judgment and thought content normal.          Assessment & Plan:  Vertigo: Suspect benign positional vertigo. Symptoms occurred  with positional movement only from left to right and supine to upright. Patient negative of worrisome symptoms such as cerebellar dysfunction or sudden onset of weakness.   Plan:  Patient reports improvement of symptoms today. Will continue to observe. If symptoms persist, consider vestibular rehabilitation.

## 2015-11-17 ENCOUNTER — Encounter: Payer: Medicare Other | Admitting: Family Medicine

## 2015-12-08 DIAGNOSIS — H02401 Unspecified ptosis of right eyelid: Secondary | ICD-10-CM | POA: Diagnosis not present

## 2015-12-08 DIAGNOSIS — Z961 Presence of intraocular lens: Secondary | ICD-10-CM | POA: Diagnosis not present

## 2015-12-08 DIAGNOSIS — Z0100 Encounter for examination of eyes and vision without abnormal findings: Secondary | ICD-10-CM | POA: Diagnosis not present

## 2015-12-08 DIAGNOSIS — H43813 Vitreous degeneration, bilateral: Secondary | ICD-10-CM | POA: Diagnosis not present

## 2016-01-26 ENCOUNTER — Telehealth: Payer: Self-pay | Admitting: Internal Medicine

## 2016-01-26 ENCOUNTER — Encounter: Payer: Medicare Other | Admitting: Family Medicine

## 2016-01-26 ENCOUNTER — Encounter: Payer: Self-pay | Admitting: Family Medicine

## 2016-01-26 ENCOUNTER — Ambulatory Visit (INDEPENDENT_AMBULATORY_CARE_PROVIDER_SITE_OTHER): Payer: BLUE CROSS/BLUE SHIELD | Admitting: Family Medicine

## 2016-01-26 VITALS — BP 138/88 | HR 67 | Temp 97.7°F | Ht 64.0 in | Wt 172.0 lb

## 2016-01-26 DIAGNOSIS — Z0001 Encounter for general adult medical examination with abnormal findings: Secondary | ICD-10-CM

## 2016-01-26 DIAGNOSIS — I10 Essential (primary) hypertension: Secondary | ICD-10-CM | POA: Diagnosis not present

## 2016-01-26 DIAGNOSIS — R351 Nocturia: Secondary | ICD-10-CM

## 2016-01-26 DIAGNOSIS — Z125 Encounter for screening for malignant neoplasm of prostate: Secondary | ICD-10-CM

## 2016-01-26 DIAGNOSIS — E785 Hyperlipidemia, unspecified: Secondary | ICD-10-CM

## 2016-01-26 DIAGNOSIS — B181 Chronic viral hepatitis B without delta-agent: Secondary | ICD-10-CM

## 2016-01-26 DIAGNOSIS — Z1211 Encounter for screening for malignant neoplasm of colon: Secondary | ICD-10-CM

## 2016-01-26 DIAGNOSIS — N401 Enlarged prostate with lower urinary tract symptoms: Secondary | ICD-10-CM

## 2016-01-26 DIAGNOSIS — R6889 Other general symptoms and signs: Secondary | ICD-10-CM

## 2016-01-26 NOTE — Progress Notes (Signed)
Phone: [phone]  Subjective:  Patient presents today for their annual physical. Chief complaint-noted.   See problem oriented charting- ROS- full  review of systems was completed and negative including No chest pain or shortness of breath. No headache or blurry vision.   The following were reviewed and entered/updated in epic: Past Medical History  Diagnosis Date  . Hypertension   . History of hepatitis B   . Cataract     right eye in [place], left here 10/2013   Patient Active Problem List   Diagnosis Date Noted  . Hyperlipidemia 07/16/2014  . Hepatitis B   . HTN (hypertension) 10/14/2010   Past Surgical History  Procedure Laterality Date  . Eye surgery      right eye cataract  in [place]  . Arm trauma      he had an industrial accident in 1991   . Skin graft full thickness arm      right arm  . Ulnar nerve transposition      in 1991    Family History  Problem Relation Age of Onset  . Cancer Father     colon  . Heart disease Father 57  . Kidney disease Father     nephectomy  . Diabetes Mother 39    Medications- reviewed and updated Current Outpatient Prescriptions  Medication Sig Dispense Refill  . aspirin EC 81 MG tablet Take 81 mg by mouth daily.    . cyclobenzaprine (FLEXERIL) 10 MG tablet Take 1 tablet (10 mg total) by mouth 2 (two) times daily as needed for muscle spasms. (Patient not taking: Reported on 10/27/2015) 30 tablet 3  . fish oil-omega-3 fatty acids 1000 MG capsule Take 1 g by mouth daily.     . irbesartan-hydrochlorothiazide (AVALIDE) 150-12.5 MG tablet TAKE 2 TABLETS BY MOUTH ONCE DAILY 180 tablet 3  . Naproxen Sodium (ALEVE PO) Take by mouth 2 (two) times daily as needed.     No current facility-administered medications for this visit.    Allergies-reviewed and updated No Known Allergies  Social History   Social History  . Marital Status: Married    Spouse Name: donna  . Number of Children: 2  . Years of Education: N/A   Occupational  History  . retired    Social History Main Topics  . Smoking status: Never Smoker   . Smokeless tobacco: Never Used  . Alcohol Use: 1.8 oz/week    3 Standard drinks or equivalent per week     Comment: occasional beer  . Drug Use: No  . Sexual Activity: Yes    Birth Control/ Protection: None   Other Topics Concern  . Not on file   Social History Narrative   Moved to [facility] in 2011 from [place] due to more affordable.       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       Married 1972 [place]). 2 kids. 5 grandkids. 2 in [place], 3 [place]. Son coaches at [employer] for soccer team and is a Writer from there.       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 Oropharynx normal Neck: no thyromegaly CV: RRR no murmurs rubs or gallops Lungs: CTAB no crackles, wheeze, rhonchi Abdomen: soft/nontender/nondistended/normal bowel sounds. No rebound or guarding. No hepatomegaly  Rectal: normal tone, diffusely enlarged prostate, no masses or tenderness Ext: no edema Skin: warm, dry Neuro: grossly normal, moves all extremities, PERRLA  Assessment/Plan:  70 y.o. male presenting for annual physical.  Health Maintenance counseling: 1. Anticipatory guidance: Patient counseled regarding regular dental exams, eye exams, wearing seatbelts.  2. Risk factor reduction:  Advised patient of need for regular exercise and diet rich and fruits and vegetables to reduce risk of heart attack and stroke. Very active at work- very physical. Advised 150 minutes a week minimum.  3. Immunizations/screenings/ancillary studies Immunization History  Administered Date(s)  Administered  . Influenza Split 07/13/2011  . Influenza,inj,Quad PF,36+ Mos 07/01/2014, 08/18/2015  . Influenza-Unspecified 06/13/2014  . Pneumococcal Polysaccharide-23 09/13/2008  . Pneumococcal-Unspecified 03/13/2014  . Td 10/14/2008  . Zoster 09/14/2014   Health Maintenance Due  Topic Date Due  . PNA vac Low Risk Adult (2 of 2 - PCV13)- Patient had pneumonia shot at [place] in 2010 and 2015- likely this was pneumovax but unclear. Likely still needs Prevnar 13. He is going to check with his wife who has the records and call back 03/14/2015   4. Prostate cancer screening- update PSA, check rectal today  Lab Results  Component Value Date   PSA 2.25 09/11/2012   PSA 2.07 09/15/2011   5. Colon cancer screening - last colonoscopy 2007- refer today  Status of chronic or acute concerns   1. Hypertension- controlled on irbesartan-hctz 150-12.5 mg. 150/90 initial, controlled on repeat. Wilburn Mylar was 137/70.  BP Readings from Last 3 Encounters:  01/26/16 138/88  10/27/15 128/80  12/23/14 130/88   2. Hyperlipidemia- 2015 we had planned to await until after Hep B treatment decision- at that time had 14.8% 10 year risk per ascvd. In 2016- he wanted to wait until after 1 year Hep B follow up per below.   3. Hepatitis B- plan is for ongoing Hep B viral load and RUQ [place] and LFTs  Patient states primary insurance is [employer] and requests we submit this as a physical.   No Follow-up on file. Return precautions advised.   fasting No orders of the defined types were placed in this encounter.    No orders of the defined types were placed in this encounter.    Garret Reddish, MD

## 2016-01-26 NOTE — Telephone Encounter (Signed)
Received Colon report and placed on Dr. Blanch Media desk for review.

## 2016-01-26 NOTE — Patient Instructions (Addendum)
We will call you within a week about your referral to GI for colonoscopy. If you do not hear within 2 weeks, give [place] a call.   PNA vac Low Risk Adult (2 of 2 - PCV13)- Patient had pneumonia shot at [place] in 2010 and 2015- likely this was pneumovax but unclear. Likely still needs Prevnar 13. He is going to check with his wife who has the records and call back  Blood pressure looks great on recheck  Labs before you leave. We will call you within a week about your referral for ultrasound of liver. If you do not hear within 2 weeks, give [place] a call.   I will make cholesterol medicine recommendation likely based on labs considering last years results

## 2016-01-26 NOTE — Addendum Note (Signed)
Addended by: Marin Olp on: 01/26/2016 01:41 PM   Modules accepted: Miquel Dunn

## 2016-01-30 ENCOUNTER — Other Ambulatory Visit: Payer: BLUE CROSS/BLUE SHIELD

## 2016-02-02 ENCOUNTER — Other Ambulatory Visit (INDEPENDENT_AMBULATORY_CARE_PROVIDER_SITE_OTHER): Payer: BLUE CROSS/BLUE SHIELD

## 2016-02-02 DIAGNOSIS — Z0000 Encounter for general adult medical examination without abnormal findings: Secondary | ICD-10-CM | POA: Diagnosis not present

## 2016-02-02 DIAGNOSIS — B1910 Unspecified viral hepatitis B without hepatic coma: Secondary | ICD-10-CM

## 2016-02-02 LAB — CBC WITH DIFFERENTIAL/PLATELET
Basophils Absolute: 0 10*3/uL (ref 0.0–0.1)
Basophils Relative: 0.5 % (ref 0.0–3.0)
EOS PCT: 2.8 % (ref 0.0–5.0)
Eosinophils Absolute: 0.2 10*3/uL (ref 0.0–0.7)
HCT: 43.8 % (ref 39.0–52.0)
Hemoglobin: 15 g/dL (ref 13.0–17.0)
LYMPHS ABS: 2.2 10*3/uL (ref 0.7–4.0)
Lymphocytes Relative: 31.5 % (ref 12.0–46.0)
MCHC: 34.3 g/dL (ref 30.0–36.0)
MCV: 94.8 fl (ref 78.0–100.0)
MONO ABS: 0.7 10*3/uL (ref 0.1–1.0)
Monocytes Relative: 9.4 % (ref 3.0–12.0)
NEUTROS PCT: 55.8 % (ref 43.0–77.0)
Neutro Abs: 3.9 10*3/uL (ref 1.4–7.7)
Platelets: 211 10*3/uL (ref 150.0–400.0)
RBC: 4.62 Mil/uL (ref 4.22–5.81)
RDW: 12.8 % (ref 11.5–15.5)
WBC: 7 10*3/uL (ref 4.0–10.5)

## 2016-02-02 LAB — HEPATIC FUNCTION PANEL
ALT: 19 U/L (ref 0–53)
AST: 18 U/L (ref 0–37)
Albumin: 4.5 g/dL (ref 3.5–5.2)
Alkaline Phosphatase: 34 U/L — ABNORMAL LOW (ref 39–117)
BILIRUBIN TOTAL: 1 mg/dL (ref 0.2–1.2)
Bilirubin, Direct: 0.2 mg/dL (ref 0.0–0.3)
Total Protein: 6.7 g/dL (ref 6.0–8.3)

## 2016-02-02 LAB — BASIC METABOLIC PANEL
BUN: 22 mg/dL (ref 6–23)
CO2: 25 mEq/L (ref 19–32)
Calcium: 9.7 mg/dL (ref 8.4–10.5)
Chloride: 103 mEq/L (ref 96–112)
Creatinine, Ser: 0.78 mg/dL (ref 0.40–1.50)
GFR: 104.99 mL/min (ref >60.00)
GLUCOSE: 93 mg/dL (ref 70–99)
POTASSIUM: 4.1 meq/L (ref 3.5–5.1)
Sodium: 136 mEq/L (ref 135–145)

## 2016-02-02 LAB — LIPID PANEL
CHOL/HDL RATIO: 4
CHOLESTEROL: 196 mg/dL (ref 0–200)
HDL: 49 mg/dL (ref >39.00)
LDL CALC: 118 mg/dL — AB (ref 0–99)
NonHDL: 146.9
TRIGLYCERIDES: 143 mg/dL (ref 0.0–149.0)
VLDL: 28.6 mg/dL (ref 0.0–40.0)

## 2016-02-02 LAB — POC URINALSYSI DIPSTICK (AUTOMATED)
Bilirubin, UA: NEGATIVE
Glucose, UA: NEGATIVE
Ketones, UA: NEGATIVE
Leukocytes, UA: NEGATIVE
NITRITE UA: NEGATIVE
PH UA: 6
RBC UA: NEGATIVE
Spec Grav, UA: 1.025
UROBILINOGEN UA: 1

## 2016-02-02 LAB — TSH: TSH: 1.08 u[IU]/mL (ref 0.35–4.50)

## 2016-02-02 LAB — PSA: PSA: 3.2 ng/mL (ref 0.10–4.00)

## 2016-02-03 ENCOUNTER — Other Ambulatory Visit: Payer: Self-pay | Admitting: Family Medicine

## 2016-02-03 DIAGNOSIS — B1910 Unspecified viral hepatitis B without hepatic coma: Secondary | ICD-10-CM

## 2016-02-03 NOTE — Progress Notes (Signed)
Orders only encounter done and routed to you for signature. When would you like lab drawn?

## 2016-02-03 NOTE — Telephone Encounter (Signed)
Dr. [name] reviewed records and has accepted patient. Ok to schedule Direct Colon. Spoke to patient wife and she will have patient call to schedule.

## 2016-02-03 NOTE — Progress Notes (Signed)
Does not have to be asap but sometime in next few weeks is ifne

## 2016-02-03 NOTE — Progress Notes (Signed)
Future Orders only encounter created. When would you like this drawn?

## 2016-02-04 ENCOUNTER — Encounter: Payer: Self-pay | Admitting: Internal Medicine

## 2016-02-04 ENCOUNTER — Telehealth: Payer: Self-pay | Admitting: Family Medicine

## 2016-02-04 NOTE — Progress Notes (Signed)
I spoke with patient and wife. I advised him to swing by office in the next week or so and have this lab drawn. He voiced understanding.

## 2016-02-04 NOTE — Progress Notes (Signed)
Left message on voicemail for pt to call back

## 2016-02-04 NOTE — Progress Notes (Signed)
Patient to "swing" by office and have lab drawn sometime this week.

## 2016-02-04 NOTE — Telephone Encounter (Signed)
Colonoscopy scheduled.

## 2016-02-04 NOTE — Telephone Encounter (Signed)
Pt returned your call said they will be home the rest of the evening

## 2016-02-05 ENCOUNTER — Ambulatory Visit
Admission: RE | Admit: 2016-02-05 | Discharge: 2016-02-05 | Disposition: A | Discharge status: Home / Self Care | Payer: Medicare Other | Source: Ambulatory Visit | Attending: Family Medicine | Admitting: Family Medicine

## 2016-02-05 DIAGNOSIS — B181 Chronic viral hepatitis B without delta-agent: Secondary | ICD-10-CM

## 2016-02-05 LAB — HEPATITIS B DNA, ULTRAQUANTITATIVE, PCR
HEPATITIS B DNA (CALC): 3.06 {Log_IU}/mL — AB (ref <1.30)
HEPATITIS B DNA: 1138 [IU]/mL — AB (ref <20)

## 2016-02-05 IMAGING — US US ABDOMEN LIMITED
1 series · 14 of 25 positions shown · non-contrast
Comparison: No prior.

CLINICAL DATA: Chronic hepatitis B.

EXAM:
US ABDOMEN LIMITED - RIGHT UPPER QUADRANT

[Series 1: us abdomen limited · 0.24mm/px · 14 of 41 slices shown]
[im 1/41]
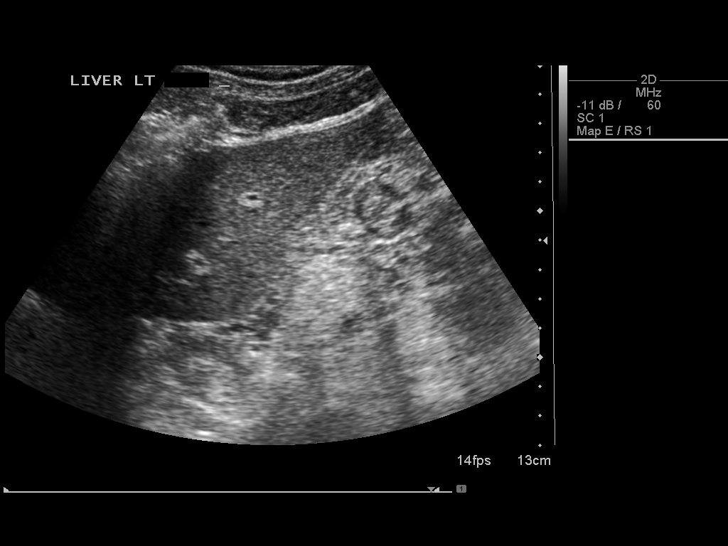
[im 4/41]
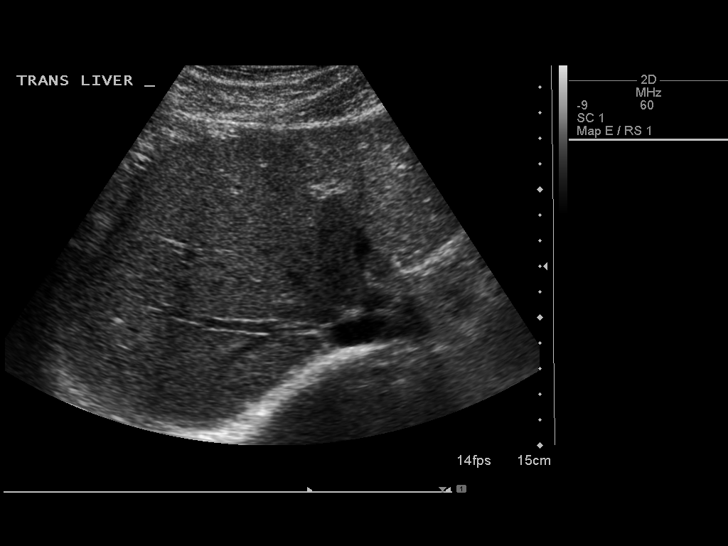
[im 7/41]
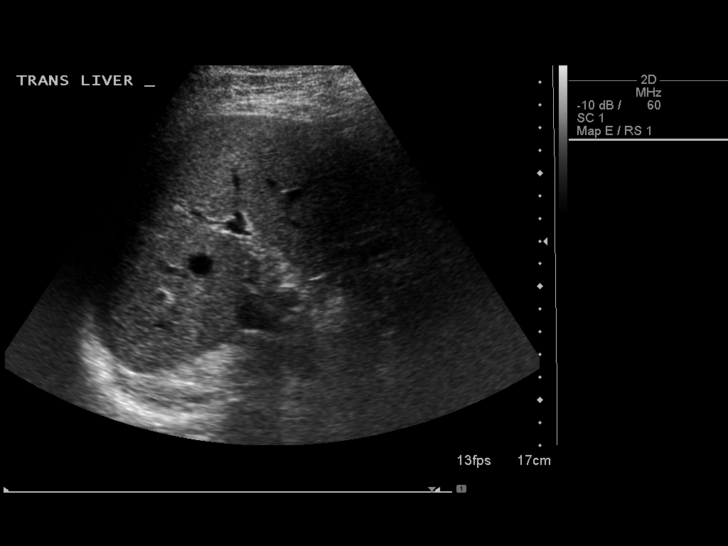
[im 11/41]
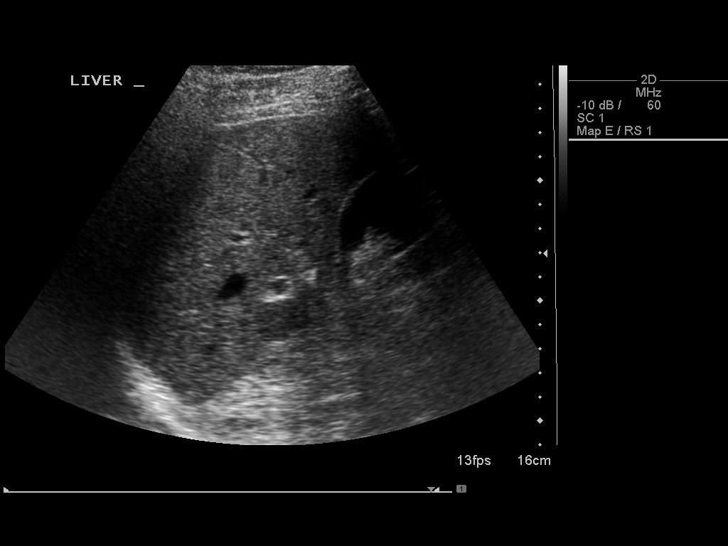
[im 14/41]
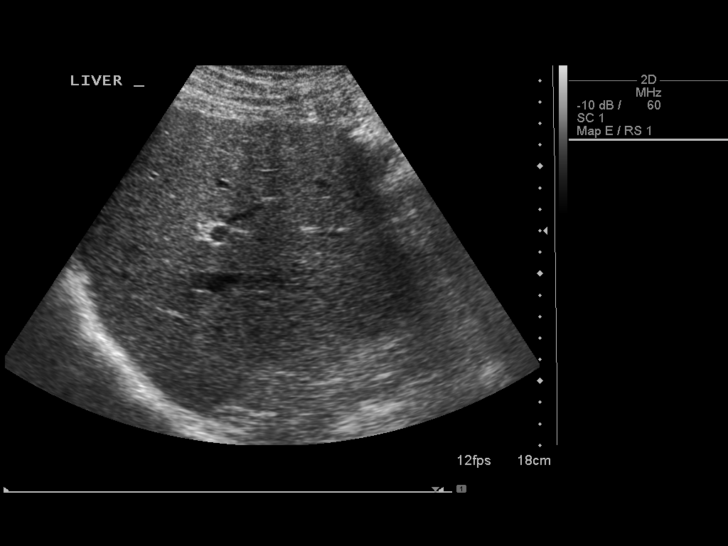
[im 16/41]
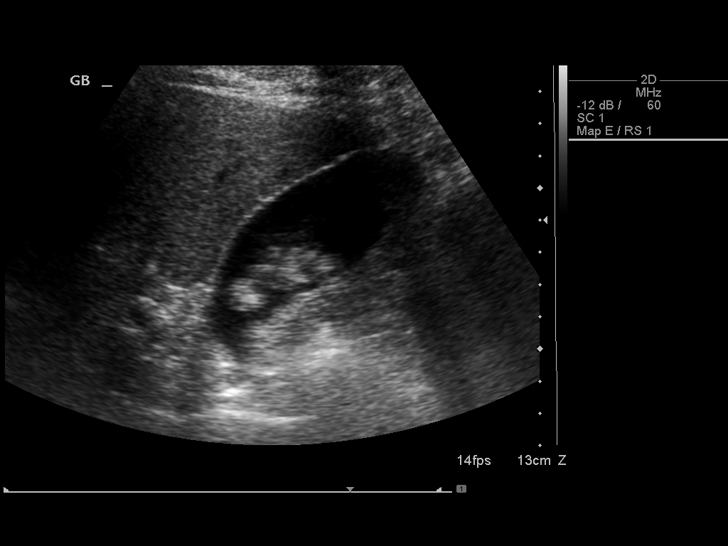
[im 19/41]
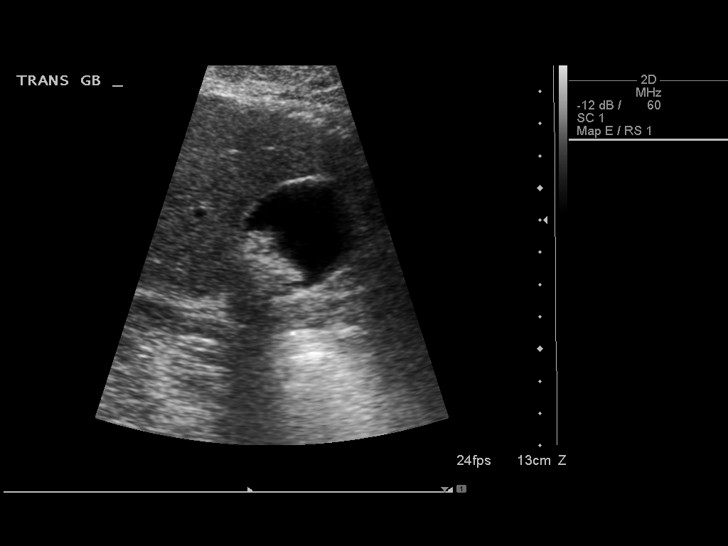
[im 22/41]
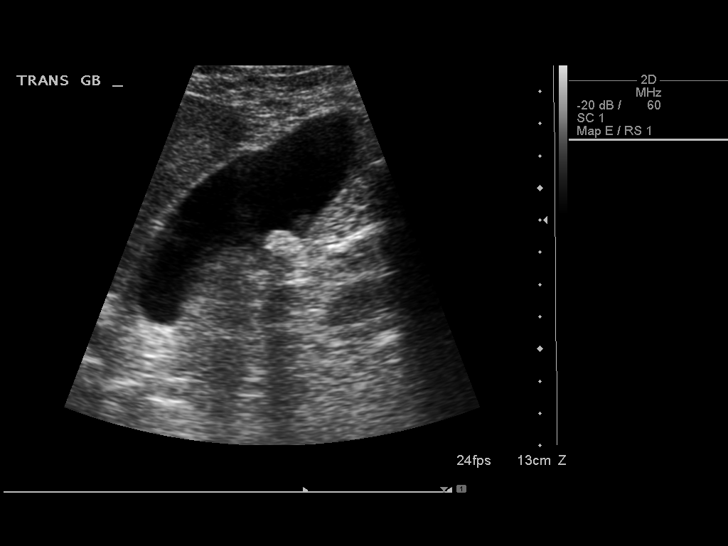
[im 26/41]
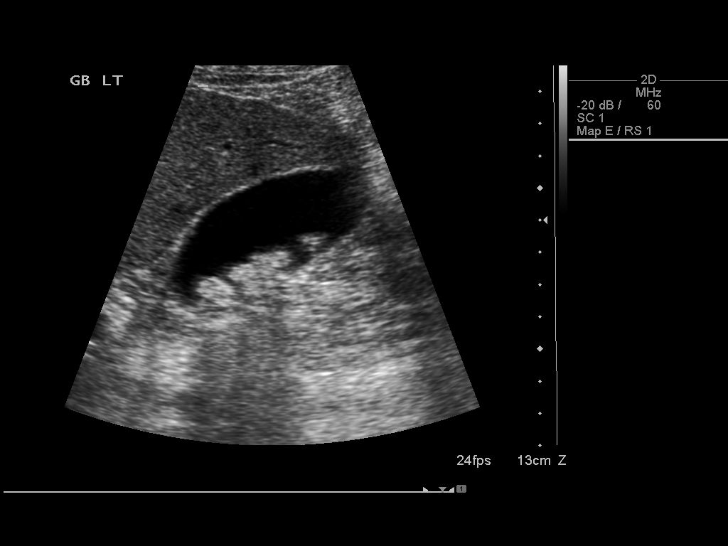
[im 27/41]
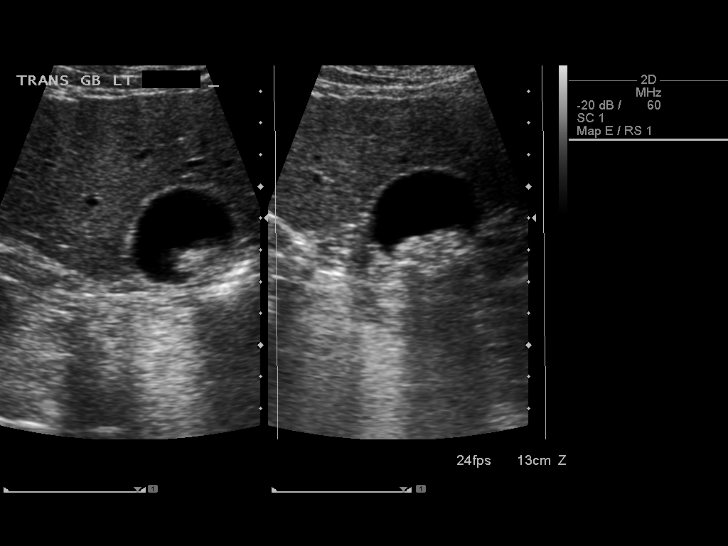
[im 31/41]
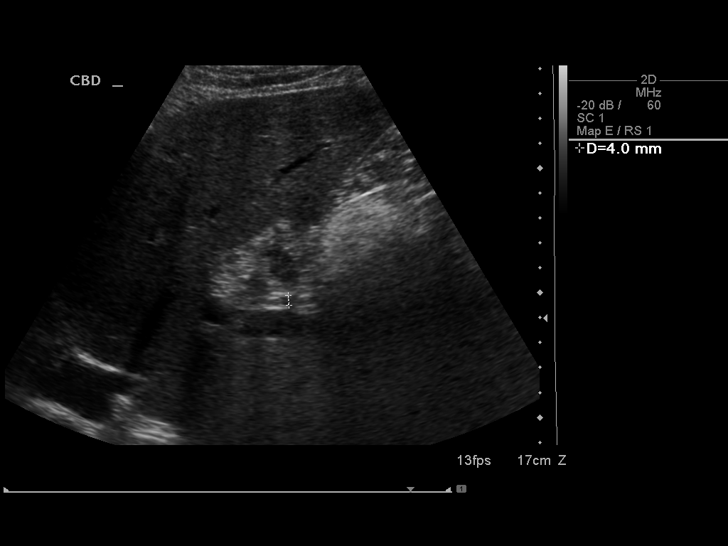
[im 34/41]
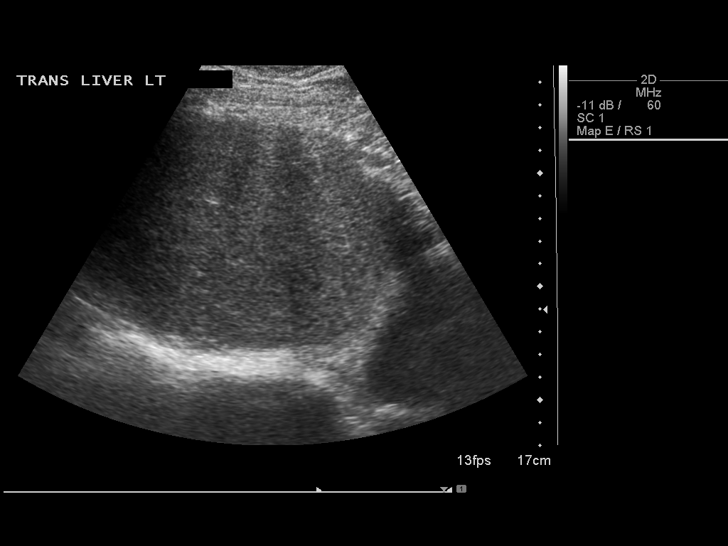
[im 37/41]
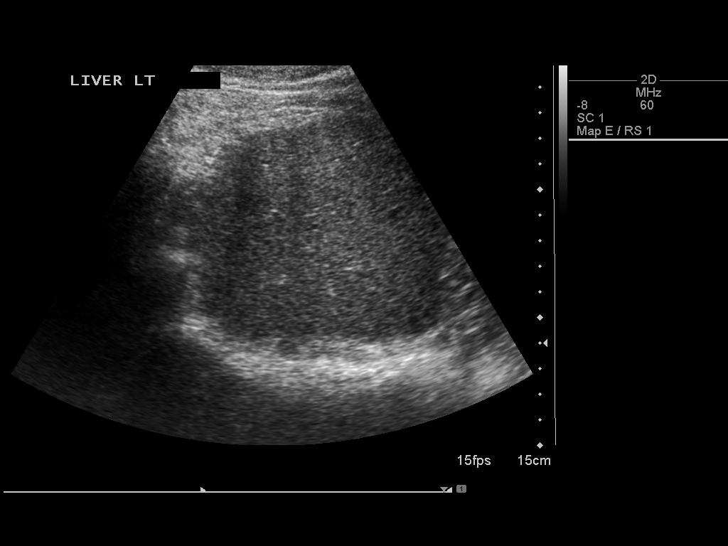
[im 41/41]
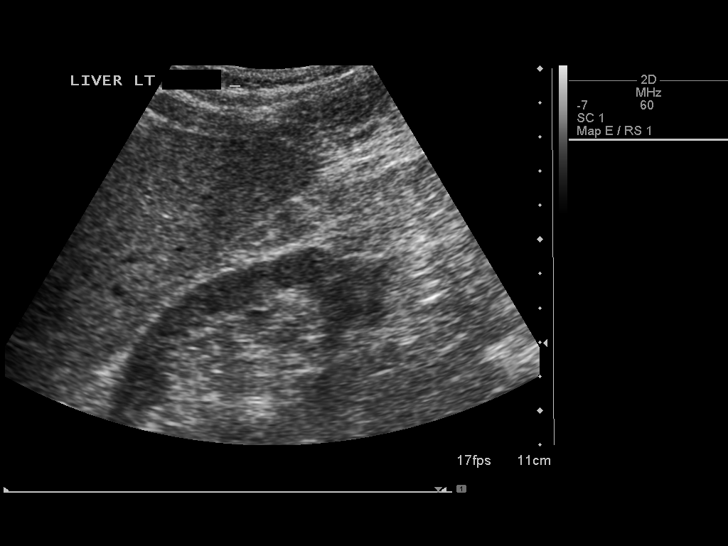

[14 of 25 positions shown; findings below may reference images not displayed]

FINDINGS: Gallbladder:

Multiple mobile gallstones. Largest measures 1.1 cm. Gallbladder
wall thickness 1.9 mm. No pericholecystic fluid collection.

Common bile duct:

Diameter: 4.0 mm

Liver:

No focal lesion identified. Within normal limits in parenchymal
echogenicity.
IMPRESSION: Cholelithiasis.  No evidence of cholecystitis.

## 2016-02-11 ENCOUNTER — Encounter: Payer: Medicare Other | Admitting: Family Medicine

## 2016-03-10 ENCOUNTER — Telehealth: Payer: Self-pay

## 2016-03-10 ENCOUNTER — Ambulatory Visit (AMBULATORY_SURGERY_CENTER): Payer: Self-pay

## 2016-03-10 VITALS — Ht 65.0 in | Wt 172.4 lb

## 2016-03-10 DIAGNOSIS — Z800 Family history of malignant neoplasm of digestive organs: Secondary | ICD-10-CM

## 2016-03-10 MED ORDER — SUPREP BOWEL PREP KIT 17.5-3.13-1.6 GM/177ML PO SOLN: 1.0000 | Freq: Once | ORAL | Status: DC

## 2016-03-10 NOTE — Progress Notes (Signed)
No allergies to eggs or soy No past problems with anesthesia No diet meds No home oxygen  Declined emmi 

## 2016-03-18 ENCOUNTER — Telehealth: Payer: Self-pay | Admitting: Internal Medicine

## 2016-03-18 NOTE — Telephone Encounter (Signed)
Left message that I would leave a Suprep sample up front to be picked up

## 2016-03-18 NOTE — Telephone Encounter (Signed)
Lm to pick up sample

## 2016-03-24 ENCOUNTER — Ambulatory Visit (AMBULATORY_SURGERY_CENTER): Payer: BLUE CROSS/BLUE SHIELD | Admitting: Internal Medicine

## 2016-03-24 ENCOUNTER — Encounter: Payer: Self-pay | Admitting: Internal Medicine

## 2016-03-24 VITALS — BP 136/71 | HR 73 | Temp 98.0°F | Resp 19 | Ht 65.0 in | Wt 172.0 lb

## 2016-03-24 DIAGNOSIS — D122 Benign neoplasm of ascending colon: Secondary | ICD-10-CM

## 2016-03-24 DIAGNOSIS — Z800 Family history of malignant neoplasm of digestive organs: Secondary | ICD-10-CM | POA: Diagnosis not present

## 2016-03-24 DIAGNOSIS — Z1211 Encounter for screening for malignant neoplasm of colon: Secondary | ICD-10-CM | POA: Diagnosis not present

## 2016-03-24 DIAGNOSIS — D123 Benign neoplasm of transverse colon: Secondary | ICD-10-CM

## 2016-03-24 HISTORY — PX: COLONOSCOPY: SHX174

## 2016-03-24 MED ORDER — SODIUM CHLORIDE 0.9 % IV SOLN: 500.0000 mL | INTRAVENOUS | Status: DC

## 2016-03-24 NOTE — Op Note (Signed)
[facility] Patient Name: Anthony Mann Procedure Date: 03/24/2016 1:16 PM MRN: [id number] Endoscopist: Docia Chuck. [name] , MD Age: 70 Referring MD:  Date of Birth: 03/25/1946 Gender: Male Account #: [id number] Procedure:                Colonoscopy, with cold snare polypectomy X5 Indications:              Screening for colorectal malignant neoplasm. Father                            with colon cancer in 23s. Multiple prior exams.                            Last examination elsewhere 2008, negative. Medicines:                Monitored Anesthesia Care Procedure:                Pre-Anesthesia Assessment:                           - Prior to the procedure, a History and Physical                            was performed, and patient medications and                            allergies were reviewed. The patient's tolerance of                            previous anesthesia was also reviewed. The risks                            and benefits of the procedure and the sedation                            options and risks were discussed with the patient.                            All questions were answered, and informed consent                            was obtained. Prior Anticoagulants: The patient has                            taken no previous anticoagulant or antiplatelet                            agents. ASA Grade Assessment: II - A patient with                            mild systemic disease. After reviewing the risks                            and benefits, the patient was deemed in  satisfactory condition to undergo the procedure.                           After obtaining informed consent, the colonoscope                            was passed under direct vision. Throughout the                            procedure, the patient's blood pressure, pulse, and                            oxygen saturations were monitored continuously. The             Model CF-HQ190L [id number]) scope was introduced                            through the anus and advanced to the the cecum,                            identified by appendiceal orifice and ileocecal                            valve. The ileocecal valve, appendiceal orifice,                            and rectum were photographed. The quality of the                            bowel preparation was excellent. The colonoscopy                            was performed without difficulty. The patient                            tolerated the procedure well. The bowel preparation                            used was SUPREP. Scope In: 1:40:12 PM Scope Out: 1:55:44 PM Scope Withdrawal Time: 0 hours 13 minutes 39 seconds  Total Procedure Duration: 0 hours 15 minutes 32 seconds  Findings:                 Five polyps were found in the transverse colon and                            ascending colon. The polyps were 2 to 5 mm in size.                            These polyps were removed with a cold snare.                            Resection and retrieval were complete.  Multiple large-mouthed diverticula were found in                            the left colon and right colon.                           Internal hemorrhoids were found during retroflexion.                           The exam was otherwise without abnormality on                            direct and retroflexion views. Complications:            No immediate complications. Estimated blood loss:                            None. Estimated Blood Loss:     Estimated blood loss: none. Recommendation:           - Repeat colonoscopy in 3 - 5 years for                            surveillance.                           - Patient has a contact number available for                            emergencies. The signs and symptoms of potential                            delayed complications were discussed with the                             patient. Return to normal activities tomorrow.                            Written discharge instructions were provided to the                            patient.                           - Resume previous diet.                           - Continue present medications.                           - Await pathology results. Docia Chuck. [name], MD 03/24/2016 2:04:53 PM This report has been signed electronically.

## 2016-03-24 NOTE — Progress Notes (Signed)
To PACU  Awake and Alert. Report to RN 

## 2016-03-24 NOTE — Patient Instructions (Signed)
YOU HAD AN ENDOSCOPIC PROCEDURE TODAY AT [facility]:   Refer to the procedure report that was given to you for any specific questions about what was found during the examination.  If the procedure report does not answer your questions, please call your gastroenterologist to clarify.  If you requested that your care partner not be given the details of your procedure findings, then the procedure report has been included in a sealed envelope for you to review at your convenience later.  YOU SHOULD EXPECT: Some feelings of bloating in the abdomen. Passage of more gas than usual.  Walking can help get rid of the air that was put into your GI tract during the procedure and reduce the bloating. If you had a lower endoscopy (such as a colonoscopy or flexible sigmoidoscopy) you may notice spotting of blood in your stool or on the toilet paper. If you underwent a bowel prep for your procedure, you may not have a normal bowel movement for a few days.  Please Note:  You might notice some irritation and congestion in your nose or some drainage.  This is from the oxygen used during your procedure.  There is no need for concern and it should clear up in a day or so.  SYMPTOMS TO REPORT IMMEDIATELY:   Following lower endoscopy (colonoscopy or flexible sigmoidoscopy):  Excessive amounts of blood in the stool  Significant tenderness or worsening of abdominal pains  Swelling of the abdomen that is new, acute  Fever of 100F or higher   For urgent or emergent issues, a gastroenterologist can be reached at any hour by calling [phone].   DIET: Your first meal following the procedure should be a small meal and then it is ok to progress to your normal diet. Heavy or fried foods are harder to digest and may make you feel nauseous or bloated.  Likewise, meals heavy in dairy and vegetables can increase bloating.  Drink plenty of fluids but you should avoid alcoholic beverages for 24  hours.  ACTIVITY:  You should plan to take it easy for the rest of today and you should NOT DRIVE or use heavy machinery until tomorrow (because of the sedation medicines used during the test).    FOLLOW UP: Our staff will call the number listed on your records the next business day following your procedure to check on you and address any questions or concerns that you may have regarding the information given to you following your procedure. If we do not reach you, we will leave a message.  However, if you are feeling well and you are not experiencing any problems, there is no need to return our call.  We will assume that you have returned to your regular daily activities without incident.  If any biopsies were taken you will be contacted by phone or by letter within the next 1-3 weeks.  Please call us at [phone] if you have not heard about the biopsies in 3 weeks.    SIGNATURES/CONFIDENTIALITY: You and/or your care partner have signed paperwork which will be entered into your electronic medical record.  These signatures attest to the fact that that the information above on your After Visit Summary has been reviewed and is understood.  Full responsibility of the confidentiality of this discharge information lies with you and/or your care-partner.  Polyps, diverticulosis, high fiber diet, and hemorrhoids handouts provided. Resume current medications.

## 2016-03-24 NOTE — Progress Notes (Signed)
Called to room to assist during endoscopic procedure.  Patient ID and intended procedure confirmed with present staff. Received instructions for my participation in the procedure from the performing physician.  

## 2016-03-25 ENCOUNTER — Telehealth: Payer: Self-pay | Admitting: *Deleted

## 2016-03-25 NOTE — Telephone Encounter (Signed)
  Follow up Call-  Call back number 03/24/2016  Post procedure Call Back phone  # [phone]  Permission to leave phone message Yes     Patient questions:  Do you have a fever, pain , or abdominal swelling? No. Pain Score  0 *  Have you tolerated food without any problems? Yes.    Have you been able to return to your normal activities? Yes.    Do you have any questions about your discharge instructions: Diet   No. Medications  No. Follow up visit  No.  Do you have questions or concerns about your Care? No.  Actions: * If pain score is 4 or above: No action needed, pain <4.

## 2016-03-29 ENCOUNTER — Encounter: Payer: Self-pay | Admitting: Internal Medicine

## 2016-03-30 DIAGNOSIS — D485 Neoplasm of uncertain behavior of skin: Secondary | ICD-10-CM | POA: Diagnosis not present

## 2016-03-30 DIAGNOSIS — L905 Scar conditions and fibrosis of skin: Secondary | ICD-10-CM | POA: Diagnosis not present

## 2016-03-30 DIAGNOSIS — D225 Melanocytic nevi of trunk: Secondary | ICD-10-CM | POA: Diagnosis not present

## 2016-03-30 DIAGNOSIS — D2262 Melanocytic nevi of left upper limb, including shoulder: Secondary | ICD-10-CM | POA: Diagnosis not present

## 2016-03-30 DIAGNOSIS — L821 Other seborrheic keratosis: Secondary | ICD-10-CM | POA: Diagnosis not present

## 2016-05-03 ENCOUNTER — Telehealth: Payer: Self-pay | Admitting: *Deleted

## 2016-05-03 NOTE — Telephone Encounter (Signed)
Sharyn Lull called from [facility] [phone]) requesting the date of the pts Pneumococcal 23 injection.  I advised Sharyn Lull according to the pts chart this was given on 09/13/2008.

## 2016-05-18 DIAGNOSIS — L905 Scar conditions and fibrosis of skin: Secondary | ICD-10-CM | POA: Diagnosis not present

## 2016-05-18 DIAGNOSIS — D485 Neoplasm of uncertain behavior of skin: Secondary | ICD-10-CM | POA: Diagnosis not present

## 2016-07-27 ENCOUNTER — Ambulatory Visit (INDEPENDENT_AMBULATORY_CARE_PROVIDER_SITE_OTHER): Payer: Medicare Other

## 2016-07-27 DIAGNOSIS — Z23 Encounter for immunization: Secondary | ICD-10-CM

## 2016-08-22 DIAGNOSIS — K5900 Constipation, unspecified: Secondary | ICD-10-CM | POA: Diagnosis not present

## 2016-09-13 DIAGNOSIS — C61 Malignant neoplasm of prostate: Secondary | ICD-10-CM

## 2016-09-13 HISTORY — DX: Malignant neoplasm of prostate: C61

## 2016-10-07 ENCOUNTER — Other Ambulatory Visit: Payer: Self-pay | Admitting: Family Medicine

## 2017-03-07 DIAGNOSIS — M4716 Other spondylosis with myelopathy, lumbar region: Secondary | ICD-10-CM | POA: Diagnosis not present

## 2017-03-07 DIAGNOSIS — M9905 Segmental and somatic dysfunction of pelvic region: Secondary | ICD-10-CM | POA: Diagnosis not present

## 2017-03-07 DIAGNOSIS — M5431 Sciatica, right side: Secondary | ICD-10-CM | POA: Diagnosis not present

## 2017-03-07 DIAGNOSIS — M9903 Segmental and somatic dysfunction of lumbar region: Secondary | ICD-10-CM | POA: Diagnosis not present

## 2017-03-08 DIAGNOSIS — M47819 Spondylosis without myelopathy or radiculopathy, site unspecified: Secondary | ICD-10-CM | POA: Diagnosis not present

## 2017-03-08 DIAGNOSIS — M519 Unspecified thoracic, thoracolumbar and lumbosacral intervertebral disc disorder: Secondary | ICD-10-CM | POA: Diagnosis not present

## 2017-03-10 DIAGNOSIS — M9903 Segmental and somatic dysfunction of lumbar region: Secondary | ICD-10-CM | POA: Diagnosis not present

## 2017-03-10 DIAGNOSIS — M4716 Other spondylosis with myelopathy, lumbar region: Secondary | ICD-10-CM | POA: Diagnosis not present

## 2017-03-10 DIAGNOSIS — M9905 Segmental and somatic dysfunction of pelvic region: Secondary | ICD-10-CM | POA: Diagnosis not present

## 2017-03-10 DIAGNOSIS — M5431 Sciatica, right side: Secondary | ICD-10-CM | POA: Diagnosis not present

## 2017-03-11 DIAGNOSIS — M5431 Sciatica, right side: Secondary | ICD-10-CM | POA: Diagnosis not present

## 2017-03-11 DIAGNOSIS — M4716 Other spondylosis with myelopathy, lumbar region: Secondary | ICD-10-CM | POA: Diagnosis not present

## 2017-03-11 DIAGNOSIS — M9903 Segmental and somatic dysfunction of lumbar region: Secondary | ICD-10-CM | POA: Diagnosis not present

## 2017-03-11 DIAGNOSIS — M9905 Segmental and somatic dysfunction of pelvic region: Secondary | ICD-10-CM | POA: Diagnosis not present

## 2017-03-21 DIAGNOSIS — M5431 Sciatica, right side: Secondary | ICD-10-CM | POA: Diagnosis not present

## 2017-03-21 DIAGNOSIS — M9903 Segmental and somatic dysfunction of lumbar region: Secondary | ICD-10-CM | POA: Diagnosis not present

## 2017-03-21 DIAGNOSIS — M9905 Segmental and somatic dysfunction of pelvic region: Secondary | ICD-10-CM | POA: Diagnosis not present

## 2017-03-21 DIAGNOSIS — M4716 Other spondylosis with myelopathy, lumbar region: Secondary | ICD-10-CM | POA: Diagnosis not present

## 2017-03-28 DIAGNOSIS — M4716 Other spondylosis with myelopathy, lumbar region: Secondary | ICD-10-CM | POA: Diagnosis not present

## 2017-03-28 DIAGNOSIS — M9903 Segmental and somatic dysfunction of lumbar region: Secondary | ICD-10-CM | POA: Diagnosis not present

## 2017-03-28 DIAGNOSIS — M9905 Segmental and somatic dysfunction of pelvic region: Secondary | ICD-10-CM | POA: Diagnosis not present

## 2017-03-28 DIAGNOSIS — M5431 Sciatica, right side: Secondary | ICD-10-CM | POA: Diagnosis not present

## 2017-04-04 DIAGNOSIS — M4716 Other spondylosis with myelopathy, lumbar region: Secondary | ICD-10-CM | POA: Diagnosis not present

## 2017-04-04 DIAGNOSIS — M9905 Segmental and somatic dysfunction of pelvic region: Secondary | ICD-10-CM | POA: Diagnosis not present

## 2017-04-04 DIAGNOSIS — M9903 Segmental and somatic dysfunction of lumbar region: Secondary | ICD-10-CM | POA: Diagnosis not present

## 2017-04-04 DIAGNOSIS — M5431 Sciatica, right side: Secondary | ICD-10-CM | POA: Diagnosis not present

## 2017-04-25 ENCOUNTER — Encounter: Payer: Self-pay | Admitting: Family Medicine

## 2017-04-25 ENCOUNTER — Ambulatory Visit (INDEPENDENT_AMBULATORY_CARE_PROVIDER_SITE_OTHER): Payer: BLUE CROSS/BLUE SHIELD | Admitting: Family Medicine

## 2017-04-25 VITALS — BP 146/82 | HR 73 | Temp 97.7°F | Ht 64.25 in | Wt 168.4 lb

## 2017-04-25 DIAGNOSIS — N401 Enlarged prostate with lower urinary tract symptoms: Secondary | ICD-10-CM

## 2017-04-25 DIAGNOSIS — B181 Chronic viral hepatitis B without delta-agent: Secondary | ICD-10-CM | POA: Diagnosis not present

## 2017-04-25 DIAGNOSIS — M9905 Segmental and somatic dysfunction of pelvic region: Secondary | ICD-10-CM | POA: Diagnosis not present

## 2017-04-25 DIAGNOSIS — M9903 Segmental and somatic dysfunction of lumbar region: Secondary | ICD-10-CM | POA: Diagnosis not present

## 2017-04-25 DIAGNOSIS — R351 Nocturia: Secondary | ICD-10-CM

## 2017-04-25 DIAGNOSIS — E785 Hyperlipidemia, unspecified: Secondary | ICD-10-CM

## 2017-04-25 DIAGNOSIS — I10 Essential (primary) hypertension: Secondary | ICD-10-CM | POA: Diagnosis not present

## 2017-04-25 DIAGNOSIS — Z0000 Encounter for general adult medical examination without abnormal findings: Secondary | ICD-10-CM

## 2017-04-25 DIAGNOSIS — M5431 Sciatica, right side: Secondary | ICD-10-CM | POA: Diagnosis not present

## 2017-04-25 DIAGNOSIS — M4716 Other spondylosis with myelopathy, lumbar region: Secondary | ICD-10-CM | POA: Diagnosis not present

## 2017-04-25 LAB — COMPREHENSIVE METABOLIC PANEL
ALBUMIN: 4.8 g/dL (ref 3.5–5.2)
ALK PHOS: 38 U/L — AB (ref 39–117)
ALT: 17 U/L (ref 0–53)
AST: 16 U/L (ref 0–37)
BILIRUBIN TOTAL: 1.4 mg/dL — AB (ref 0.2–1.2)
BUN: 18 mg/dL (ref 6–23)
CALCIUM: 10 mg/dL (ref 8.4–10.5)
CO2: 29 meq/L (ref 19–32)
CREATININE: 0.93 mg/dL (ref 0.40–1.50)
Chloride: 100 mEq/L (ref 96–112)
GFR: 85.4 mL/min (ref >60.00)
Glucose, Bld: 88 mg/dL (ref 70–99)
Potassium: 4.3 mEq/L (ref 3.5–5.1)
Sodium: 137 mEq/L (ref 135–145)
Total Protein: 6.7 g/dL (ref 6.0–8.3)

## 2017-04-25 LAB — CBC
HCT: 46.3 % (ref 39.0–52.0)
Hemoglobin: 15.8 g/dL (ref 13.0–17.0)
MCHC: 34 g/dL (ref 30.0–36.0)
MCV: 96.3 fl (ref 78.0–100.0)
PLATELETS: 204 10*3/uL (ref 150.0–400.0)
RBC: 4.81 Mil/uL (ref 4.22–5.81)
RDW: 12.4 % (ref 11.5–15.5)
WBC: 5.8 10*3/uL (ref 4.0–10.5)

## 2017-04-25 LAB — LIPID PANEL
CHOL/HDL RATIO: 3
CHOLESTEROL: 182 mg/dL (ref 0–200)
HDL: 56.9 mg/dL (ref >39.00)
LDL CALC: 99 mg/dL (ref 0–99)
NonHDL: 125.43
TRIGLYCERIDES: 130 mg/dL (ref 0.0–149.0)
VLDL: 26 mg/dL (ref 0.0–40.0)

## 2017-04-25 LAB — PSA: PSA: 6.03 ng/mL — ABNORMAL HIGH (ref 0.10–4.00)

## 2017-04-25 MED ORDER — IRBESARTAN-HYDROCHLOROTHIAZIDE 150-12.5 MG PO TABS
2.0000 | ORAL_TABLET | Freq: Every day | ORAL | 3 refills | Status: DC
Start: 2017-04-25 — End: 2018-05-22

## 2017-04-25 NOTE — Assessment & Plan Note (Signed)
S: at home 131/81 this morning and always controlled. Compliant  Irbesartan-hctz 150-12.5mg  (2 pills once a day).  BP Readings from Last 3 Encounters:  04/25/17 (!) 146/82  03/24/16 136/71  01/26/16 138/88  A/P: We discussed blood pressure goal of <140/90 preferred but is at [facility] goal for age. Continue current meds:  Recheck 2 months. Advised dash diet

## 2017-04-25 NOTE — Assessment & Plan Note (Signed)
S: mild poorly controlled on last check. No myalgias.  Lab Results  Component Value Date   CHOL 196 02/02/2016   HDL 49.00 02/02/2016   LDLCALC 118 (H) 02/02/2016   TRIG 143.0 02/02/2016   CHOLHDL 4 02/02/2016   A/P: update lipids and calculate 10 year risk

## 2017-04-25 NOTE — Assessment & Plan Note (Signed)
S: Hepatatis B has been stable A/P: get Hep B viral load, LFTs and [place]

## 2017-04-25 NOTE — Patient Instructions (Addendum)
Please stop by lab before you go  PNA vac Low Risk Adult (2 of 2 - PCV13)- Patient had pneumonia shot at [place] in 2010 and 2015- likely this was pneumovax but unclear. Likely still needs Prevnar 13. He is going to check with his wife who has the records and call back  Recheck blood pressure with me in 2 months. Advised dash diet (see below). Bring your cuff with you so we can compare and make sure it is accurate  Since we are not likely treating you for Hep B soon- I will likely start you on cholesterol medicine if levels are similar to previous- You had a 10 year risk of heart attack or stroke a few years ago of 15% and I usually start medicine over 12 %  ______________________________________________________________________  Starting October 1st 2018, I will be transferring to our new location: [place] (corner of [address] from [employer]) [place] Phone: [phone]  I would love to have you remain my patient at this new location as long as it remains convenient for you. I am excited about the opportunity to have x-ray and sports medicine in the new building but will really miss the awesome staff and physicians at [facility]. Continue to schedule appointments at [facility] and we will automatically transfer them to the [place] location starting October 1st.    ____________________________________________________   DASH Eating Plan DASH stands for "Dietary Approaches to Stop Hypertension." The DASH eating plan is a healthy eating plan that has been shown to reduce high blood pressure (hypertension). It may also reduce your risk for type 2 diabetes, heart disease, and stroke. The DASH eating plan may also help with weight loss. What are tips for following this plan? General guidelines  Avoid eating more than 2,300 mg (milligrams) of salt (sodium) a day. If you have  hypertension, you may need to reduce your sodium intake to 1,500 mg a day.  Limit alcohol intake to no more than 1 drink a day for nonpregnant women and 2 drinks a day for men. One drink equals 12 oz of beer, 5 oz of wine, or 1 oz of hard liquor.  Work with your health care provider to maintain a healthy body weight or to lose weight. Ask what an ideal weight is for you.  Get at least 30 minutes of exercise that causes your heart to beat faster (aerobic exercise) most days of the week. Activities may include walking, swimming, or biking.  Work with your health care provider or diet and nutrition specialist (dietitian) to adjust your eating plan to your individual calorie needs. Reading food labels  Check food labels for the amount of sodium per serving. Choose foods with less than 5 percent of the Daily Value of sodium. Generally, foods with less than 300 mg of sodium per serving fit into this eating plan.  To find whole grains, look for the word "whole" as the first word in the ingredient list. Shopping  Buy products labeled as "low-sodium" or "no salt added."  Buy fresh foods. Avoid canned foods and premade or frozen meals. Cooking  Avoid adding salt when cooking. Use salt-free seasonings or herbs instead of table salt or sea salt. Check with your health care provider or pharmacist before using salt substitutes.  Do not fry foods. Cook foods using healthy methods such as baking, boiling, grilling, and broiling instead.  Cook with heart-healthy  oils, such as olive, canola, soybean, or sunflower oil. Meal planning   Eat a balanced diet that includes: ? 5 or more servings of fruits and vegetables each day. At each meal, try to fill half of your plate with fruits and vegetables. ? Up to 6-8 servings of whole grains each day. ? Less than 6 oz of lean meat, poultry, or fish each day. A 3-oz serving of meat is about the same size as a deck of cards. One egg equals 1 oz. ? 2 servings of  low-fat dairy each day. ? A serving of nuts, seeds, or beans 5 times each week. ? Heart-healthy fats. Healthy fats called Omega-3 fatty acids are found in foods such as flaxseeds and coldwater fish, like sardines, salmon, and mackerel.  Limit how much you eat of the following: ? Canned or prepackaged foods. ? Food that is high in trans fat, such as fried foods. ? Food that is high in saturated fat, such as fatty meat. ? Sweets, desserts, sugary drinks, and other foods with added sugar. ? Full-fat dairy products.  Do not salt foods before eating.  Try to eat at least 2 vegetarian meals each week.  Eat more home-cooked food and less restaurant, buffet, and fast food.  When eating at a restaurant, ask that your food be prepared with less salt or no salt, if possible. What foods are recommended? The items listed may not be a complete list. Talk with your dietitian about what dietary choices are best for you. Grains Whole-grain or whole-wheat bread. Whole-grain or whole-wheat pasta. Brown rice. [place]row. Bulgur. Whole-grain and low-sodium cereals. Pita bread. Low-fat, low-sodium crackers. Whole-wheat flour tortillas. Vegetables Fresh or frozen vegetables (raw, steamed, roasted, or grilled). Low-sodium or reduced-sodium tomato and vegetable juice. Low-sodium or reduced-sodium tomato sauce and tomato paste. Low-sodium or reduced-sodium canned vegetables. Fruits All fresh, dried, or frozen fruit. Canned fruit in natural juice (without added sugar). Meat and other protein foods Skinless chicken or [place]. Ground chicken or [place]. Pork with fat trimmed off. Fish and seafood. Egg whites. Dried beans, peas, or lentils. Unsalted nuts, nut butters, and seeds. Unsalted canned beans. Lean cuts of beef with fat trimmed off. Low-sodium, lean deli meat. Dairy Low-fat (1%) or fat-free (skim) milk. Fat-free, low-fat, or reduced-fat cheeses. Nonfat, low-sodium ricotta or cottage cheese. Low-fat or  nonfat yogurt. Low-fat, low-sodium cheese. Fats and oils Soft margarine without trans fats. Vegetable oil. Low-fat, reduced-fat, or light mayonnaise and salad dressings (reduced-sodium). Canola, safflower, olive, soybean, and sunflower oils. Avocado. Seasoning and other foods Herbs. Spices. Seasoning mixes without salt. Unsalted popcorn and pretzels. Fat-free sweets. What foods are not recommended? The items listed may not be a complete list. Talk with your dietitian about what dietary choices are best for you. Grains Baked goods made with fat, such as croissants, muffins, or some breads. Dry pasta or rice meal packs. Vegetables Creamed or fried vegetables. Vegetables in a cheese sauce. Regular canned vegetables (not low-sodium or reduced-sodium). Regular canned tomato sauce and paste (not low-sodium or reduced-sodium). Regular tomato and vegetable juice (not low-sodium or reduced-sodium). Angie Fava. Olives. Fruits Canned fruit in a light or heavy syrup. Fried fruit. Fruit in cream or butter sauce. Meat and other protein foods Fatty cuts of meat. Ribs. Fried meat. Berniece Salines. Sausage. Bologna and other processed lunch meats. Salami. Fatback. Hotdogs. Bratwurst. Salted nuts and seeds. Canned beans with added salt. Canned or smoked fish. Whole eggs or egg yolks. Chicken or [place] with skin. Dairy Whole or 2%  milk, cream, and half-and-half. Whole or full-fat cream cheese. Whole-fat or sweetened yogurt. Full-fat cheese. Nondairy creamers. Whipped toppings. Processed cheese and cheese spreads. Fats and oils Butter. Stick margarine. Lard. Shortening. Ghee. Bacon fat. Tropical oils, such as coconut, palm kernel, or palm oil. Seasoning and other foods Salted popcorn and pretzels. Onion salt, garlic salt, seasoned salt, table salt, and sea salt. Worcestershire sauce. Tartar sauce. Barbecue sauce. Teriyaki sauce. Soy sauce, including reduced-sodium. Steak sauce. Canned and packaged gravies. Fish sauce. Oyster  sauce. Cocktail sauce. Horseradish that you find on the shelf. Ketchup. Mustard. Meat flavorings and tenderizers. Bouillon cubes. Hot sauce and Tabasco sauce. Premade or packaged marinades. Premade or packaged taco seasonings. Relishes. Regular salad dressings. Where to find more information:  National Heart, Lung, and [place]: https://wilson-eaton.com/  American Heart Association: www.heart.org Summary  The DASH eating plan is a healthy eating plan that has been shown to reduce high blood pressure (hypertension). It may also reduce your risk for type 2 diabetes, heart disease, and stroke.  With the DASH eating plan, you should limit salt (sodium) intake to 2,300 mg a day. If you have hypertension, you may need to reduce your sodium intake to 1,500 mg a day.  When on the DASH eating plan, aim to eat more fresh fruits and vegetables, whole grains, lean proteins, low-fat dairy, and heart-healthy fats.  Work with your health care provider or diet and nutrition specialist (dietitian) to adjust your eating plan to your individual calorie needs. This information is not intended to replace advice given to you by your health care provider. Make sure you discuss any questions you have with your health care provider. Document Released: 08/19/2011 Document Revised: 08/23/2016 Document Reviewed: 08/23/2016 Elsevier Interactive Patient Education  2017 [employer].

## 2017-04-25 NOTE — Progress Notes (Signed)
Phone: [phone]  Subjective:  Patient presents today for their annual physical. Chief complaint-noted.   See problem oriented charting- ROS- full  review of systems was completed and negative except for: some left mid back pain at times with lifting at [employer].   The following were reviewed and entered/updated in epic: Past Medical History:  Diagnosis Date  . Cataract    right eye in [place], left here 10/2013  . History of hepatitis B   . Hypertension    Patient Active Problem List   Diagnosis Date Noted  . BPH associated with nocturia 01/26/2016  . Hyperlipidemia 07/16/2014  . Hepatitis B   . HTN (hypertension) 10/14/2010   Past Surgical History:  Procedure Laterality Date  . arm trauma     he had an industrial accident in 1991   . EYE SURGERY     right eye cataract  in [place]  . SKIN GRAFT FULL THICKNESS ARM     right arm  . ULNAR NERVE TRANSPOSITION     in 1991    Family History  Problem Relation Age of Onset  . Cancer Father        colon  . Heart disease Father 60  . Kidney disease Father        nephectomy  . Colon cancer Father   . Diabetes Mother 29    Medications- reviewed and updated Current Outpatient Prescriptions  Medication Sig Dispense Refill  . aspirin EC 81 MG tablet Take 81 mg by mouth daily.    . irbesartan-hydrochlorothiazide (AVALIDE) 150-12.5 MG tablet TAKE 2 TABLETS BY MOUTH ONCE DAILY 180 tablet 3  . naproxen sodium (ANAPROX) 220 MG tablet Take 220 mg by mouth 2 (two) times daily with a meal.     No current facility-administered medications for this visit.     Allergies-reviewed and updated No Known Allergies  Social History   Social History  . Marital status: Married    Spouse name: donna  . Number of children: 2  . Years of education: N/A   Occupational History  . retired    Social History Main Topics  . Smoking status: Never Smoker  . Smokeless tobacco: Never Used  . Alcohol use 1.8 oz/week    3 Standard drinks or  equivalent per week     Comment: occasional beer  . Drug use: No  . Sexual activity: Yes    Birth control/ protection: None   Other Topics Concern  . None   Social History Narrative   Moved to [place] in 2011 from [place] due to more affordable.       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       Married 1972 [place]). 2 kids. 5 grandkids. 2 in [place], 3 [place]. Son coaches at [employer] for soccer team and is a Writer from there.       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 Oropharynx normal Neck: no thyromegaly CV: RRR no murmurs rubs or gallops Lungs: CTAB no crackles, wheeze, rhonchi Abdomen:  soft/nontender/nondistended/normal bowel sounds. No rebound or guarding.  Ext: no edema Skin: warm, dry Neuro: grossly normal, moves all extremities, PERRLA Rectal: normal tone, diffusely enlarged prostate, no masses or tenderness   Assessment/Plan:  [employer] is primary insurance and requests physical (per his report but records show medicare primary)  71 y.o. male presenting for annual physical.  Health Maintenance counseling: 1. Anticipatory guidance: Patient counseled regarding regular dental exams- wears dentures, eye exams- goes once a year, wearing seatbelts.  2. Risk factor reduction:  Advised patient of need for regular exercise and diet rich and fruits and vegetables to reduce risk of heart attack and stroke. Active with work- declines exercising more than what he does at [employer] about 17.5 hours a week. Lost 4 lbs from last year.  3. Immunizations/screenings/ancillary studies- Advised flu shot in the fall. Discussed shingrix  (already had zostavax) - may get at [place]- we are on shortage here Immunization History  Administered Date(s) Administered  . Influenza Split 07/13/2011  . Influenza, High Dose Seasonal PF 07/27/2016  . Influenza,inj,Quad PF,36+ Mos 07/01/2014, 08/18/2015  . Influenza-Unspecified 06/13/2014  . Pneumococcal Conjugate-13 03/11/2014  . Pneumococcal Polysaccharide-23 09/13/2008  . Pneumococcal-Unspecified 03/13/2014  . Td 10/14/2008  . Zoster 09/14/2014   4. Prostate cancer screening-  PSA trended up some last year but had not been checked in 4 years- will update trend today. BPH on exam- could be the cause.  Lab Results  Component Value Date   PSA 3.20 02/02/2016   PSA 2.25 09/11/2012   PSA 2.07 09/15/2011   5. Colon cancer screening - 03/24/16 with 3 year follow up due to adenoma 6. Skin cancer screening-  advised regular sunscreen use, indoors most of the time. Denies worrisome or changing skin lesions.   Status of chronic or acute concerns   HTN (hypertension) S: at home 131/81 this morning and always controlled. Compliant  Irbesartan-hctz 150-12.5mg  (2 pills once a day).  BP Readings from Last 3 Encounters:  04/25/17 (!) 146/82  03/24/16 136/71  01/26/16 138/88  A/P: We discussed blood pressure goal of <140/90 preferred but is at [facility] goal for age. Continue current meds:  Recheck 2 months. Advised dash diet  Hyperlipidemia S: mild poorly controlled on last check. No myalgias.  Lab Results  Component Value Date   CHOL 196 02/02/2016   HDL 49.00 02/02/2016   LDLCALC 118 (H) 02/02/2016   TRIG 143.0 02/02/2016   CHOLHDL 4 02/02/2016   A/P: update lipids and calculate 10 year risk  Hepatitis B S: Hepatatis B has been stable A/P: get Hep B viral load, LFTs and [place]   Return in about 2 months (around 06/25/2017).  Orders Placed This Encounter  Procedures  . US Abdomen Limited RUQ    Standing Status:   Future    Standing Expiration Date:   06/25/2018    Order Specific  Question:   Reason for Exam (SYMPTOM  OR DIAGNOSIS REQUIRED)    Answer:   Hep B- screen for cirrhosis/hepatocellular carcinoma    Order Specific Question:   Preferred imaging location?    Answer:   [facility]  . Comprehensive metabolic panel    [place]    Order Specific Question:   Has the patient fasted?    Answer:   No  . CBC    [place]  . Lipid panel        Order Specific Question:   Has the patient fasted?    Answer:   No  .  PSA   Return precautions advised.   Garret Reddish, MD

## 2017-04-26 ENCOUNTER — Other Ambulatory Visit: Payer: Self-pay

## 2017-04-26 ENCOUNTER — Ambulatory Visit
Admission: RE | Admit: 2017-04-26 | Discharge: 2017-04-26 | Disposition: A | Discharge status: Home / Self Care | Payer: Medicare Other | Source: Ambulatory Visit | Attending: Family Medicine | Admitting: Family Medicine

## 2017-04-26 DIAGNOSIS — B181 Chronic viral hepatitis B without delta-agent: Secondary | ICD-10-CM

## 2017-04-26 DIAGNOSIS — B1910 Unspecified viral hepatitis B without hepatic coma: Secondary | ICD-10-CM | POA: Diagnosis not present

## 2017-04-26 DIAGNOSIS — R972 Elevated prostate specific antigen [PSA]: Secondary | ICD-10-CM

## 2017-05-09 DIAGNOSIS — M4716 Other spondylosis with myelopathy, lumbar region: Secondary | ICD-10-CM | POA: Diagnosis not present

## 2017-05-09 DIAGNOSIS — M5431 Sciatica, right side: Secondary | ICD-10-CM | POA: Diagnosis not present

## 2017-05-09 DIAGNOSIS — M9903 Segmental and somatic dysfunction of lumbar region: Secondary | ICD-10-CM | POA: Diagnosis not present

## 2017-05-09 DIAGNOSIS — M9905 Segmental and somatic dysfunction of pelvic region: Secondary | ICD-10-CM | POA: Diagnosis not present

## 2017-05-13 IMAGING — US US ABDOMEN LIMITED
1 series · 14 of 25 positions shown · non-contrast
Comparison: Abdominal ultrasound [DATE]

CLINICAL DATA: Hepatitis-B; cirrhosis and hepatocellular carcinoma
screening. Known gallstones.

EXAM:
ULTRASOUND ABDOMEN LIMITED RIGHT UPPER QUADRANT

[Series 1: us abdomen limited · 0.17mm/px · 14 of 40 slices shown]
[im 1/40]
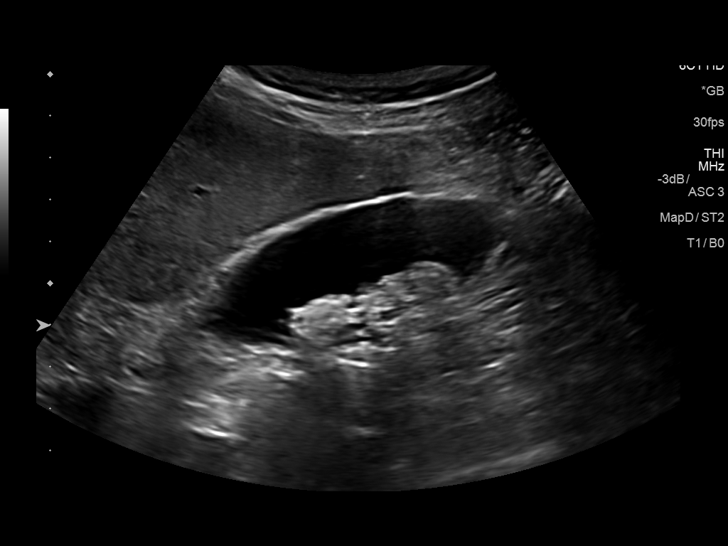
[im 4/40]
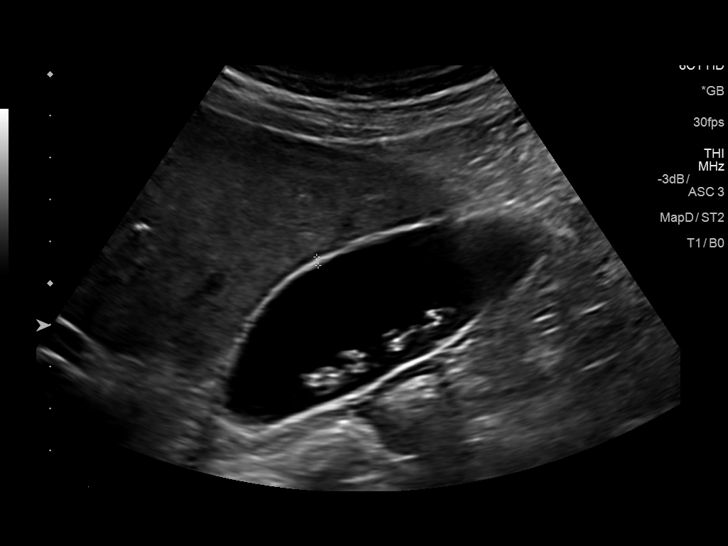
[im 7/40]
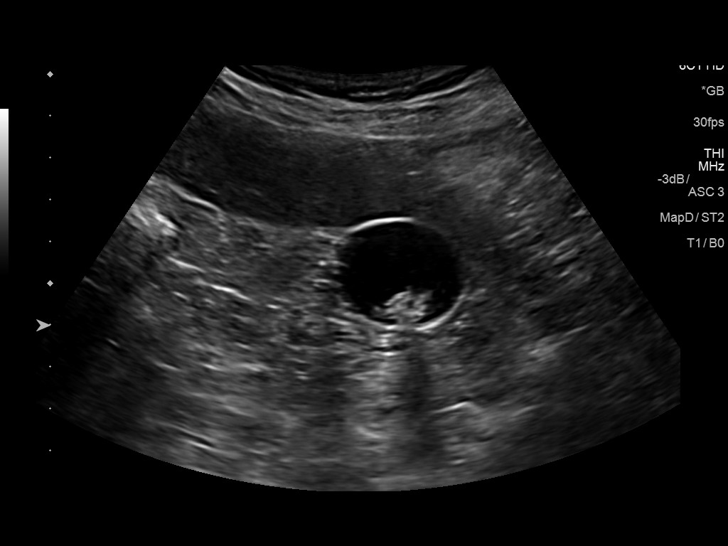
[im 10/40]
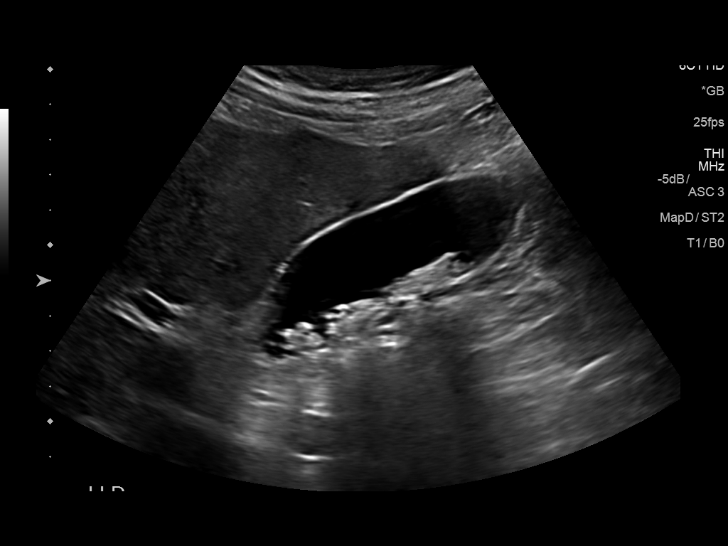
[im 14/40]
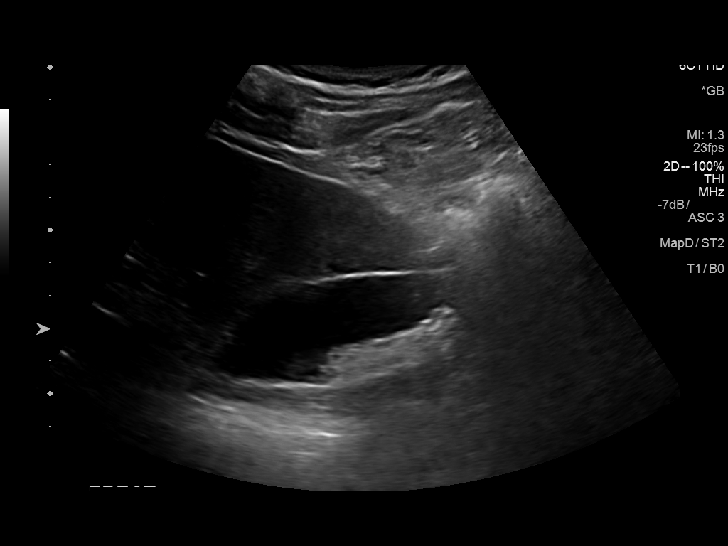
[im 15/40]
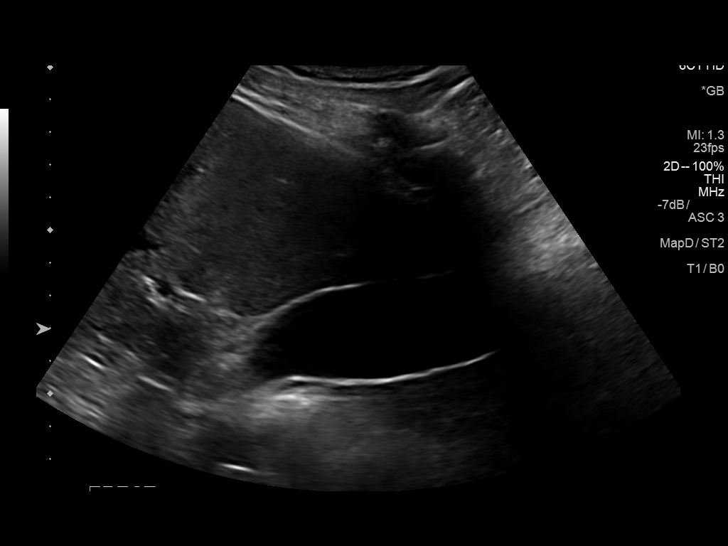
[im 18/40]
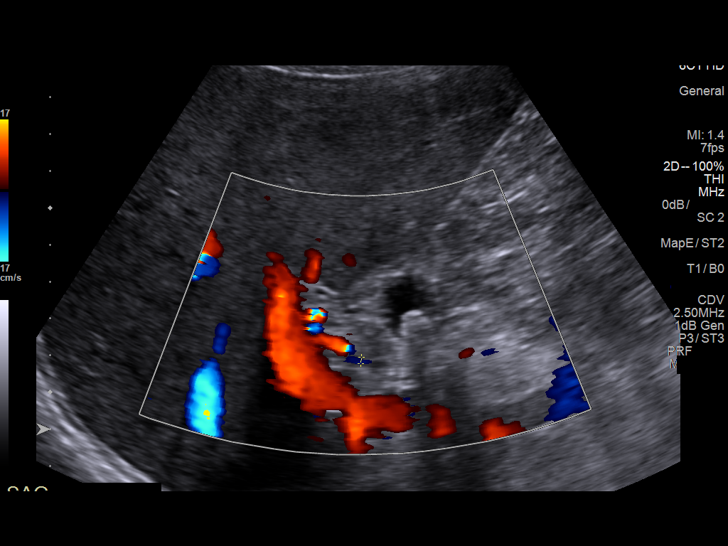
[im 22/40]
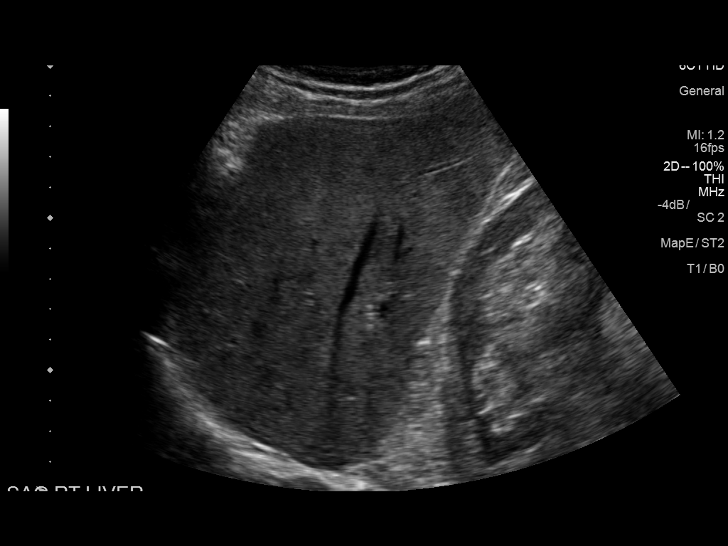
[im 25/40]
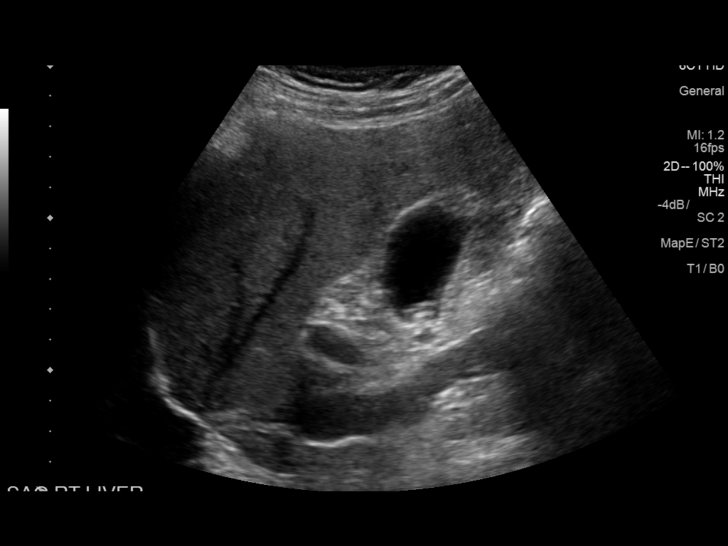
[im 27/40]
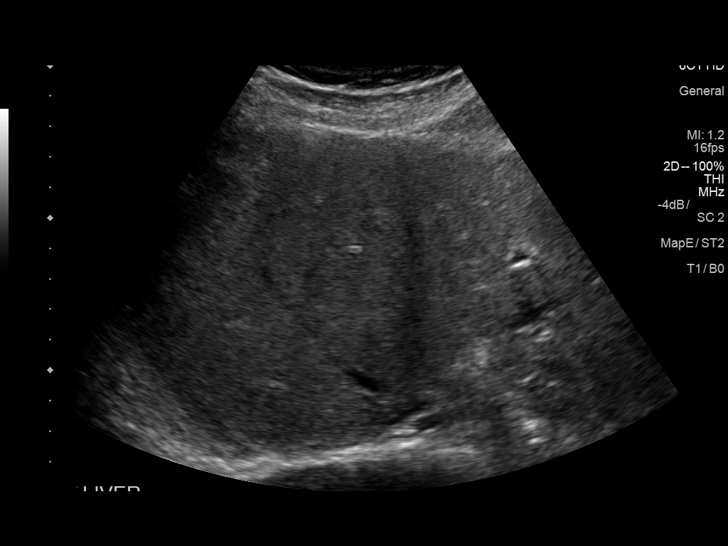
[im 30/40]
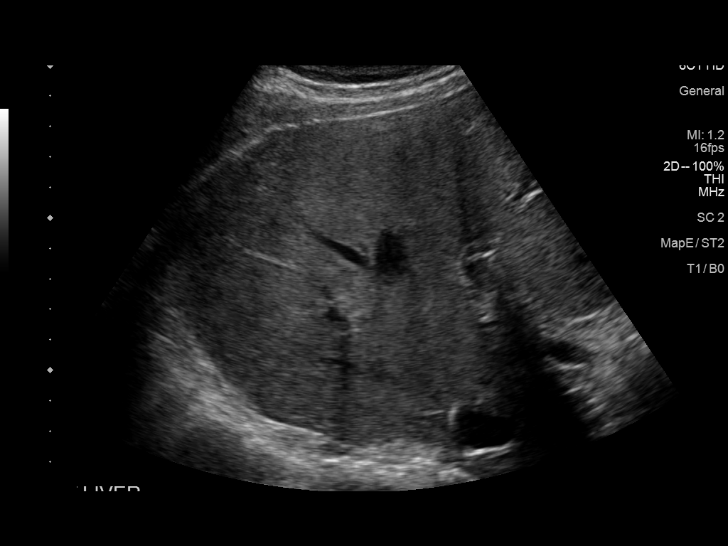
[im 33/40]
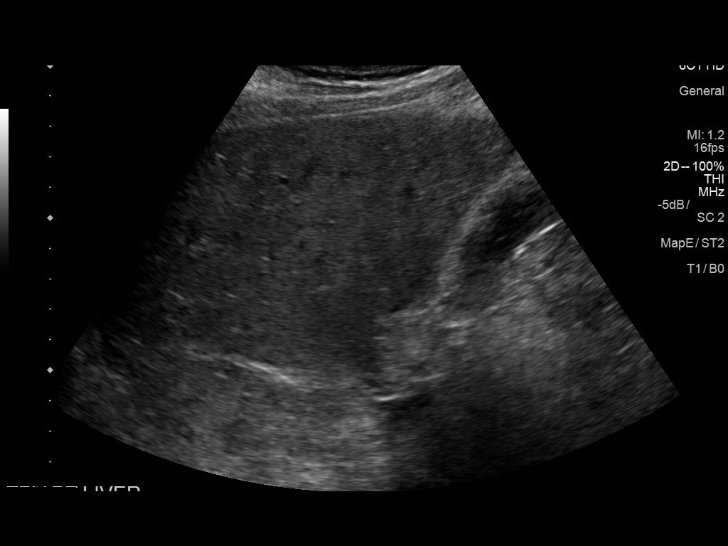
[im 36/40]
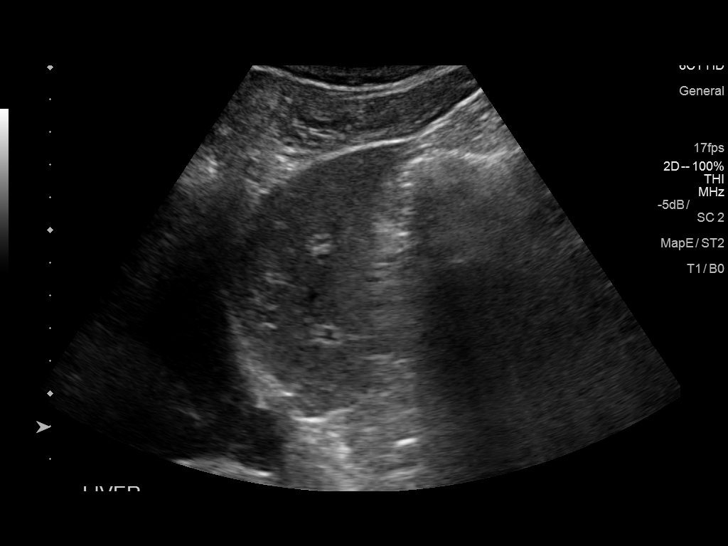
[im 40/40]
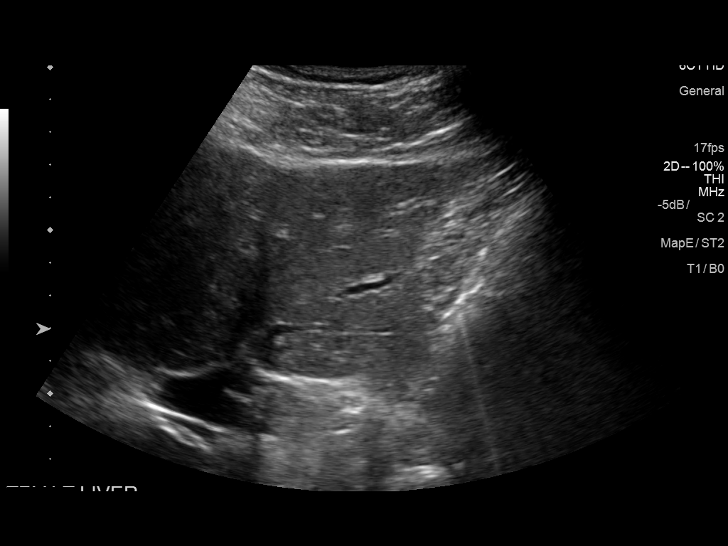

[14 of 25 positions shown; findings below may reference images not displayed]

FINDINGS: Gallbladder:

There are multiple echogenic mobile shadowing stones with the
largest measuring 1.8 cm in diameter. Some sludge is suspected.
There is no gallbladder wall thickening, pericholecystic fluid, or
positive sonographic Murphy's sign.

Common bile duct:

Diameter: 1.7 mm

Liver:

The hepatic echotexture is heterogeneous. The surface contour
remains smooth. No focal mass or ductal dilation is observed.
IMPRESSION: Multiple gallstones as well as sludge. No sonographic evidence of
acute cholecystitis.

Mildly heterogeneous hepatic echotexture without definite cirrhotic
changes. No suspicious hepatic masses are observed.

## 2017-05-30 DIAGNOSIS — M9905 Segmental and somatic dysfunction of pelvic region: Secondary | ICD-10-CM | POA: Diagnosis not present

## 2017-05-30 DIAGNOSIS — M4716 Other spondylosis with myelopathy, lumbar region: Secondary | ICD-10-CM | POA: Diagnosis not present

## 2017-05-30 DIAGNOSIS — M5431 Sciatica, right side: Secondary | ICD-10-CM | POA: Diagnosis not present

## 2017-05-30 DIAGNOSIS — M9903 Segmental and somatic dysfunction of lumbar region: Secondary | ICD-10-CM | POA: Diagnosis not present

## 2017-06-13 DIAGNOSIS — N433 Hydrocele, unspecified: Secondary | ICD-10-CM | POA: Diagnosis not present

## 2017-06-13 DIAGNOSIS — R972 Elevated prostate specific antigen [PSA]: Secondary | ICD-10-CM | POA: Diagnosis not present

## 2017-06-13 LAB — PSA: PSA: 5.79

## 2017-06-27 ENCOUNTER — Encounter: Payer: Self-pay | Admitting: Family Medicine

## 2017-06-27 ENCOUNTER — Ambulatory Visit (INDEPENDENT_AMBULATORY_CARE_PROVIDER_SITE_OTHER): Payer: BLUE CROSS/BLUE SHIELD | Admitting: Family Medicine

## 2017-06-27 VITALS — BP 118/78 | HR 77 | Temp 98.3°F | Ht 64.25 in | Wt 170.0 lb

## 2017-06-27 DIAGNOSIS — B181 Chronic viral hepatitis B without delta-agent: Secondary | ICD-10-CM

## 2017-06-27 DIAGNOSIS — R4589 Other symptoms and signs involving emotional state: Secondary | ICD-10-CM | POA: Insufficient documentation

## 2017-06-27 DIAGNOSIS — Z23 Encounter for immunization: Secondary | ICD-10-CM

## 2017-06-27 DIAGNOSIS — I10 Essential (primary) hypertension: Secondary | ICD-10-CM

## 2017-06-27 DIAGNOSIS — F329 Major depressive disorder, single episode, unspecified: Secondary | ICD-10-CM | POA: Diagnosis not present

## 2017-06-27 LAB — COMPREHENSIVE METABOLIC PANEL
ALT: 18 U/L (ref 0–53)
AST: 19 U/L (ref 0–37)
Albumin: 4.5 g/dL (ref 3.5–5.2)
Alkaline Phosphatase: 41 U/L (ref 39–117)
BUN: 19 mg/dL (ref 6–23)
CHLORIDE: 99 meq/L (ref 96–112)
CO2: 26 mEq/L (ref 19–32)
Calcium: 10 mg/dL (ref 8.4–10.5)
Creatinine, Ser: 0.96 mg/dL (ref 0.40–1.50)
GFR: 82.28 mL/min (ref >60.00)
GLUCOSE: 99 mg/dL (ref 70–99)
POTASSIUM: 3.8 meq/L (ref 3.5–5.1)
SODIUM: 135 meq/L (ref 135–145)
Total Bilirubin: 0.8 mg/dL (ref 0.2–1.2)
Total Protein: 7.2 g/dL (ref 6.0–8.3)

## 2017-06-27 NOTE — Assessment & Plan Note (Signed)
S: hepatitis B- for some reason last HBV DNA was not collected. Mild ast elevations last check. Had RUQ [place] for [facility] monitoring 04/26/17 and reassuring A/P: check HBV DNA and hopeful remains under 2000 IU/ml. Updated lfts normalized Lab Results  Component Value Date   ALT 18 06/27/2017   AST 19 06/27/2017   ALKPHOS 41 06/27/2017   BILITOT 0.8 06/27/2017

## 2017-06-27 NOTE — Progress Notes (Signed)
Subjective:  Anthony Mann is a 71 y.o. year old very pleasant male patient who presents for/with See problem oriented charting ROS- admits to some depressed mood and anhedonia. No SI. No chest pain or shortness of breath. No headache or blurry vision.    Past Medical History-  Patient Active Problem List   Diagnosis Date Noted  . Hepatitis B     Priority: High  . HTN (hypertension) 10/14/2010    Priority: Medium  . Depressed mood 06/27/2017  . BPH associated with nocturia 01/26/2016  . Hyperlipidemia 07/16/2014    Medications- reviewed and updated Current Outpatient Prescriptions  Medication Sig Dispense Refill  . aspirin EC 81 MG tablet Take 81 mg by mouth daily.    . irbesartan-hydrochlorothiazide (AVALIDE) 150-12.5 MG tablet Take 2 tablets by mouth daily. 180 tablet 3  . naproxen sodium (ANAPROX) 220 MG tablet Take 220 mg by mouth 2 (two) times daily with a meal.     No current facility-administered medications for this visit.     Objective: BP 118/78 (BP Location: Left Arm, Patient Position: Sitting, Cuff Size: Large)   Pulse 77   Temp 98.3 F (36.8 C) (Oral)   Ht 5' 4.25" (1.632 m)   Wt 170 lb (77.1 kg)   SpO2 96%   BMI 28.95 kg/m  Gen: NAD, resting comfortably Psych: somewhat down mood when discussing things he is no longer able to do  Assessment/Plan:  Prostate biopsy planned in November  HTN (hypertension) S: controlled on irbesartan hctz 150-12.5mg  (2 pills once a day for total 300-25mg ). Last viist slightly elevated so we decided to recheck today. Weight up 2 lbs.   Home #s this morning 139/79. Varies at home from 120/60.  BP Readings from Last 3 Encounters:  06/27/17 118/78  04/25/17 (!) 146/82  03/24/16 136/71  A/P: We discussed blood pressure goal of <140/90. Continue current meds:  Meeting goals at both home and in office today  Hepatitis B S: hepatitis B- for some reason last HBV DNA was not collected. Mild ast elevations last check. Had RUQ [place] for  [facility] monitoring 04/26/17 and reassuring A/P: check HBV DNA and hopeful remains under 2000 IU/ml. Updated lfts normalized Lab Results  Component Value Date   ALT 18 06/27/2017   AST 19 06/27/2017   ALKPHOS 41 06/27/2017   BILITOT 0.8 06/27/2017    Needs yearly CPE at minimum. 6 month to reevaluate HBV reasonable and check in on depressed mood. Sent mychart message  Orders Placed This Encounter  Procedures  . Flu vaccine HIGH DOSE PF  . Comprehensive metabolic panel    [place]  . Hepatitis B DNA, ultraquantitative, PCR   The duration of face-to-face time during this visit was greater than 25 minutes. Greater than 50% of this time was spent in counseling, explanation of diagnosis, planning of further management, and/or coordination of care including discussion of depression, coping options, discussing frustrations/differences between him and wife and what they like to do.   Return precautions advised.  Garret Reddish, MD

## 2017-06-27 NOTE — Assessment & Plan Note (Signed)
S: controlled on irbesartan hctz 150-12.5mg  (2 pills once a day for total 300-25mg ). Last viist slightly elevated so we decided to recheck today. Weight up 2 lbs.   Home #s this morning 139/79. Varies at home from 120/60.  BP Readings from Last 3 Encounters:  06/27/17 118/78  04/25/17 (!) 146/82  03/24/16 136/71  A/P: We discussed blood pressure goal of <140/90. Continue current meds:  Meeting goals at both home and in office today

## 2017-06-27 NOTE — Patient Instructions (Signed)
Please stop by lab before you go  No changes today   

## 2017-06-27 NOTE — Assessment & Plan Note (Signed)
S: depressed mood for last few months. Wife feels like he doesn't want to go out as much- he claims he has never wanted to go out but does agree changes in life and not being able to do what he did 10-20 years ago frustrates him.  A/P: PHQ9 of 8 today- 2 for #1, 1 for #2, 3 for #3, 2 for #4 and 0 for #9. Declines medication. Declines counseling but given handout. Also declines meds. Encouraged him to reengage with community- he is thinking about getting back into coaching

## 2017-06-29 LAB — HEPATITIS B DNA, ULTRAQUANTITATIVE, PCR
HEPATITIS B DNA (CALC): 3.05 {Log_IU}/mL — AB
HEPATITIS B DNA: 1110 [IU]/mL — AB

## 2017-07-21 DIAGNOSIS — R972 Elevated prostate specific antigen [PSA]: Secondary | ICD-10-CM | POA: Diagnosis not present

## 2017-07-21 DIAGNOSIS — C61 Malignant neoplasm of prostate: Secondary | ICD-10-CM | POA: Diagnosis not present

## 2017-08-01 DIAGNOSIS — C61 Malignant neoplasm of prostate: Secondary | ICD-10-CM | POA: Diagnosis not present

## 2017-08-01 DIAGNOSIS — R972 Elevated prostate specific antigen [PSA]: Secondary | ICD-10-CM | POA: Diagnosis not present

## 2017-08-02 ENCOUNTER — Encounter: Payer: Self-pay | Admitting: Radiation Oncology

## 2017-08-03 ENCOUNTER — Encounter: Payer: Self-pay | Admitting: Family Medicine

## 2017-08-03 DIAGNOSIS — C61 Malignant neoplasm of prostate: Secondary | ICD-10-CM | POA: Insufficient documentation

## 2017-08-03 LAB — PSA, FREE: PSA, FREE: 1.19

## 2017-08-25 ENCOUNTER — Encounter: Payer: Self-pay | Admitting: Radiation Oncology

## 2017-08-25 NOTE — Progress Notes (Signed)
GU Location of Tumor / Histology: prostatic adenocarcinoma  If Prostate Cancer, Gleason Score is (3 + 3) and PSA is (5.79) on 06/13/2017. Prostate volume: 50 grams.  Anthony Mann was referred by Dr. Garret Reddish III to Dr. Lovena Neighbours in October of 2018 for further evaluation of an elevated PSA.  Biopsies of prostate (if applicable) revealed:    Past/Anticipated interventions by urology, if any: prostate biopsy, referral to radiation oncology to discuss treatment options  Past/Anticipated interventions by medical oncology, if any: no  Weight changes, if any: no  Bowel/Bladder complaints, if any: IPSS 2 with nocturia and frequency. Denies dysuria, hematuria, leakage, or incontinence.    Nausea/Vomiting, if any: no  Pain issues, if any:  Chronic low back pain.  SAFETY ISSUES:  Prior radiation? no  Pacemaker/ICD? no  Possible current pregnancy? no  Is the patient on methotrexate? no  Current Complaints / other details:  71 year old. Male. Married. Father with hx of colon ca. Denies agent orange exposure in [place] war. Will follow up with [facility] after Feb 21 for repeat lab then make a decision.

## 2017-08-29 ENCOUNTER — Ambulatory Visit
Admission: RE | Admit: 2017-08-29 | Discharge: 2017-08-29 | Disposition: A | Discharge status: Home / Self Care | Payer: BLUE CROSS/BLUE SHIELD | Source: Ambulatory Visit | Attending: Radiation Oncology | Admitting: Radiation Oncology

## 2017-08-29 ENCOUNTER — Encounter: Payer: Self-pay | Admitting: Medical Oncology

## 2017-08-29 ENCOUNTER — Other Ambulatory Visit: Payer: Self-pay

## 2017-08-29 ENCOUNTER — Encounter: Payer: Self-pay | Admitting: Radiation Oncology

## 2017-08-29 VITALS — BP 160/91 | HR 79 | Resp 16 | Ht 66.0 in | Wt 172.6 lb

## 2017-08-29 DIAGNOSIS — Z87891 Personal history of nicotine dependence: Secondary | ICD-10-CM | POA: Insufficient documentation

## 2017-08-29 DIAGNOSIS — I10 Essential (primary) hypertension: Secondary | ICD-10-CM | POA: Insufficient documentation

## 2017-08-29 DIAGNOSIS — R972 Elevated prostate specific antigen [PSA]: Secondary | ICD-10-CM | POA: Diagnosis not present

## 2017-08-29 DIAGNOSIS — Z79899 Other long term (current) drug therapy: Secondary | ICD-10-CM | POA: Insufficient documentation

## 2017-08-29 DIAGNOSIS — C61 Malignant neoplasm of prostate: Secondary | ICD-10-CM | POA: Diagnosis not present

## 2017-08-29 DIAGNOSIS — Z7982 Long term (current) use of aspirin: Secondary | ICD-10-CM | POA: Insufficient documentation

## 2017-08-29 HISTORY — DX: Malignant neoplasm of prostate: C61

## 2017-08-29 NOTE — Progress Notes (Signed)
Radiation Oncology         [phone] ________________________________  Initial Outpatient Consultation  Name: Anthony Mann MRN: [id number]  Date: 08/29/2017  DOB: [birth year]  KG:[id number], Brayton Mars, MD  Davis Gourd*   REFERRING PHYSICIAN: Davis Gourd*  DIAGNOSIS: 71 y.o. gentleman with stage T1c adenocarcinoma of the prostate with a Gleason's score of 3+3 and a PSA of 5.79    ICD-10-CM   1. Malignant neoplasm of prostate ([place]) C61     HISTORY OF PRESENT ILLNESS::Anthony Mann is a 71 y.o. gentleman.  He was noted to have an elevated PSA of 6.03 in August 2018 by his primary care physician, Dr. Garret Reddish. His PSA one year ago was 3.2. Accordingly, he was referred for evaluation in urology by Dr. Ellison Hughs on 06/13/2017, where a digital rectal examination was performed at that time revealing a rubbery right lobe, right lobe larger than left lobe, and no distinct nodule. Repeat lab work revealed PSA 5.79. The patient proceeded to transrectal ultrasound with 12 biopsies of the prostate on 07/21/2017.  The prostate volume measured 50 cc.  Out of 12 core biopsies, 2 were positive.  The maximum Gleason score was 3+3, and this was seen in the left apex lateral and right apex.  Biopsies of prostate revealed:    The patient reviewed the biopsy results with his urologist and he has kindly been referred today for discussion of potential radiation treatment options. He is accompanied by his wife.  PREVIOUS RADIATION THERAPY: No  PAST MEDICAL HISTORY:  has a past medical history of Cataract, History of hepatitis B, Hypertension, and Prostate cancer ([place]).    PAST SURGICAL HISTORY: Past Surgical History:  Procedure Laterality Date  . arm trauma     he had an industrial accident in 1991   . EYE SURGERY     right eye cataract  in [place]    . SKIN GRAFT FULL THICKNESS ARM     right arm  . ULNAR NERVE TRANSPOSITION     in 1991    FAMILY  HISTORY: family history includes Cancer in his father; Colon cancer in his father; Diabetes (age of onset: 51) in his mother; Heart disease (age of onset: 38) in his father; Kidney disease in his father.  SOCIAL HISTORY:  reports that he quit smoking about 28 years ago. His smoking use included cigarettes. He has a 4.50 pack-year smoking history. he has never used smokeless tobacco. He reports that he drinks about 1.8 oz of alcohol per week. He reports that he does not use drugs.  ALLERGIES: Patient has no known allergies.  MEDICATIONS:  Current Outpatient Medications  Medication Sig Dispense Refill  . aspirin EC 81 MG tablet Take 81 mg by mouth daily.    . irbesartan-hydrochlorothiazide (AVALIDE) 150-12.5 MG tablet Take 2 tablets by mouth daily. 180 tablet 3  . naproxen sodium (ANAPROX) 220 MG tablet Take 220 mg by mouth 2 (two) times daily with a meal.     No current facility-administered medications for this encounter.     REVIEW OF SYSTEMS:  On review of systems, the patient reports that he is doing well overall. He denies any chest pain, shortness of breath, cough, fevers, chills, night sweats, or unintended weight changes. He denies any bowel or bladder disturbances, and denies abdominal pain, nausea or vomiting. He reports chronic low back pain. He denies any new skin lesions or concerns.  The patient completed an IPSS and IIEF questionnaire.  His IPSS score was 2 indicating mild urinary outflow obstructive symptoms of nocturia and frequency. He denies dysuria, hematuria, leakage or incontinence. He indicated that he is not currently sexually active. A complete review of systems is obtained and is otherwise negative.   PHYSICAL EXAM:  height is 5\' 6"  (1.676 m) and weight is 172 lb 9.6 oz (78.3 kg). His blood pressure is 160/91 (abnormal) and his pulse is 79. His respiration is 16 and oxygen saturation is 100%.  He exhibits no respiratory distress or labored breathing.  He appears  neurologically intact.  His mood is pleasant.  His affect is appropriate.  Please note the digital rectal exam findings described above.  KPS = 90  100 - Normal; no complaints; no evidence of disease. 90   - Able to carry on normal activity; minor signs or symptoms of disease. 80   - Normal activity with effort; some signs or symptoms of disease. 24   - Cares for self; unable to carry on normal activity or to do active work. 60   - Requires occasional assistance, but is able to care for most of his personal needs. 50   - Requires considerable assistance and frequent medical care. 57   - Disabled; requires special care and assistance. 85   - Severely disabled; hospital admission is indicated although death not imminent. 57   - Very sick; hospital admission necessary; active supportive treatment necessary. 10   - Moribund; fatal processes progressing rapidly. 0     - Dead  Karnofsky DA, Abelmann [place], Craver LS and Burchenal [facility] [phone]) The use of the nitrogen mustards in the palliative treatment of carcinoma: with particular reference to bronchogenic carcinoma Cancer 1 [phone]   LABORATORY DATA:  Lab Results  Component Value Date   WBC 5.8 04/25/2017   HGB 15.8 04/25/2017   HCT 46.3 04/25/2017   MCV 96.3 04/25/2017   PLT 204.0 04/25/2017   Lab Results  Component Value Date   NA 135 06/27/2017   K 3.8 06/27/2017   CL 99 06/27/2017   CO2 26 06/27/2017   Lab Results  Component Value Date   ALT 18 06/27/2017   AST 19 06/27/2017   ALKPHOS 41 06/27/2017   BILITOT 0.8 06/27/2017     RADIOGRAPHY: No results found.    IMPRESSION: This is a 71 y.o. gentleman with stage T1c adenocarcinoma of the prostate with a Gleason's score of 3+3 and a PSA of 5.79.  His T-Stage, Gleason's Score, and PSA put him into the favorable risk group.  Accordingly he is eligible for a variety of potential treatment options including active surveillance, prostate brachytherapy, prostatectomy, or external beam  radiotherapy.  We discussed the available radiation techniques, and focused on the details of logistics and delivery.  We reviewed the anticipated acute and late sequelae associated with radiation in this setting. We discussed that active surveillance would require repeat PSA testing every 3 months. We also discussed that we normally consider prosate MRI to guide surveillance biopsies every 12-24 months. The patient was encouraged to ask questions that I answered to the best of my ability.    PLAN: The patient presented to radiation oncology today to discuss potential radiation treatment options. He will follow up with Dr. Lovena Neighbours after November 03, 2017 for repeat PSA then make a decision to either continue on active surveillance or pursue treatment.  I spent time face to face with the patient and more than 50% of that time was spent in counseling and/or  coordination of care.   ------------------------------------------------  Sheral Apley. Tammi Klippel, M.D.  This document serves as a record of services personally performed by Tyler Pita, MD. It was created on his behalf by Rae Lips, a trained medical scribe. The creation of this record is based on the scribe's personal observations and the provider's statements to them. This document has been checked and approved by the attending provider.

## 2017-08-29 NOTE — Progress Notes (Signed)
See progress note under physician encounter. 

## 2017-08-29 NOTE — Progress Notes (Signed)
Introduced myself to Mr. Trompeter and his wife as the prostate nurse navigator and my role. He states that he was recently diagnosed with prostate cancer with a low gleason score. He is here today to hear about radiation treatment options. He will  follow up with Dr. Lovena Neighbours in February to recheck his PSA. Pending results will decide whether to pursue treatment. I gave them my business card and asked them to call with questions or concerns.

## 2017-10-17 DIAGNOSIS — C61 Malignant neoplasm of prostate: Secondary | ICD-10-CM | POA: Diagnosis not present

## 2017-10-17 LAB — PSA: PSA: 5.85

## 2017-10-24 DIAGNOSIS — R972 Elevated prostate specific antigen [PSA]: Secondary | ICD-10-CM | POA: Diagnosis not present

## 2017-10-24 DIAGNOSIS — C61 Malignant neoplasm of prostate: Secondary | ICD-10-CM | POA: Diagnosis not present

## 2017-10-24 DIAGNOSIS — N433 Hydrocele, unspecified: Secondary | ICD-10-CM | POA: Diagnosis not present

## 2017-10-25 ENCOUNTER — Encounter: Payer: Self-pay | Admitting: Family Medicine

## 2017-10-31 ENCOUNTER — Ambulatory Visit (INDEPENDENT_AMBULATORY_CARE_PROVIDER_SITE_OTHER): Payer: Managed Care, Other (non HMO) | Admitting: Family Medicine

## 2017-10-31 ENCOUNTER — Encounter: Payer: Self-pay | Admitting: Family Medicine

## 2017-10-31 VITALS — BP 132/86 | HR 71 | Temp 97.8°F | Ht 66.0 in | Wt 175.2 lb

## 2017-10-31 DIAGNOSIS — F329 Major depressive disorder, single episode, unspecified: Secondary | ICD-10-CM

## 2017-10-31 DIAGNOSIS — I10 Essential (primary) hypertension: Secondary | ICD-10-CM | POA: Diagnosis not present

## 2017-10-31 DIAGNOSIS — C61 Malignant neoplasm of prostate: Secondary | ICD-10-CM

## 2017-10-31 DIAGNOSIS — B181 Chronic viral hepatitis B without delta-agent: Secondary | ICD-10-CM | POA: Diagnosis not present

## 2017-10-31 DIAGNOSIS — R4589 Other symptoms and signs involving emotional state: Secondary | ICD-10-CM

## 2017-10-31 LAB — COMPREHENSIVE METABOLIC PANEL
ALT: 17 U/L (ref 0–53)
AST: 17 U/L (ref 0–37)
Albumin: 4.4 g/dL (ref 3.5–5.2)
Alkaline Phosphatase: 35 U/L — ABNORMAL LOW (ref 39–117)
BUN: 17 mg/dL (ref 6–23)
CHLORIDE: 101 meq/L (ref 96–112)
CO2: 25 meq/L (ref 19–32)
Calcium: 9.5 mg/dL (ref 8.4–10.5)
Creatinine, Ser: 0.95 mg/dL (ref 0.40–1.50)
GFR: 83.2 mL/min (ref >60.00)
GLUCOSE: 93 mg/dL (ref 70–99)
POTASSIUM: 3.7 meq/L (ref 3.5–5.1)
SODIUM: 135 meq/L (ref 135–145)
Total Bilirubin: 0.8 mg/dL (ref 0.2–1.2)
Total Protein: 7.1 g/dL (ref 6.0–8.3)

## 2017-10-31 LAB — CBC
HEMATOCRIT: 44.4 % (ref 39.0–52.0)
Hemoglobin: 15.6 g/dL (ref 13.0–17.0)
MCHC: 35.1 g/dL (ref 30.0–36.0)
MCV: 92.9 fl (ref 78.0–100.0)
Platelets: 211 10*3/uL (ref 150.0–400.0)
RBC: 4.78 Mil/uL (ref 4.22–5.81)
RDW: 12.9 % (ref 11.5–15.5)
WBC: 6.4 10*3/uL (ref 4.0–10.5)

## 2017-10-31 MED ORDER — AMLODIPINE BESYLATE 2.5 MG PO TABS: 2.5000 mg | ORAL_TABLET | Freq: Every day | ORAL | 3 refills | Status: DC

## 2017-10-31 NOTE — Progress Notes (Signed)
Subjective:  Deloris Mittag is a 72 y.o. year old very pleasant male patient who presents for/with See problem oriented charting ROS- No chest pain or shortness of breath. No headache or blurry vision.  States depressed mood has resolved.    Past Medical History-  Patient Active Problem List   Diagnosis Date Noted  . Prostate cancer ([place]) 08/03/2017    Priority: High  . Hepatitis B     Priority: High  . Depressed mood 06/27/2017    Priority: Medium  . HTN (hypertension) 10/14/2010    Priority: Medium  . BPH associated with nocturia 01/26/2016  . Hyperlipidemia 07/16/2014    Medications- reviewed and updated Current Outpatient Medications  Medication Sig Dispense Refill  . aspirin EC 81 MG tablet Take 81 mg by mouth daily.    . irbesartan-hydrochlorothiazide (AVALIDE) 150-12.5 MG tablet Take 2 tablets by mouth daily. 180 tablet 3  . naproxen sodium (ANAPROX) 220 MG tablet Take 220 mg by mouth 2 (two) times daily with a meal.     No current facility-administered medications for this visit.     Objective: BP 132/86 (BP Location: Left Arm, Patient Position: Sitting, Cuff Size: Large)   Pulse 71   Temp 97.8 F (36.6 C) (Oral)   Ht 5\' 6"  (1.676 m)   Wt 175 lb 3.2 oz (79.5 kg)   SpO2 98%   BMI 28.28 kg/m  Gen: NAD, resting comfortably CV: RRR no murmurs rubs or gallops Lungs: CTAB no crackles, wheeze, rhonchi Abdomen: soft/nontender/nondistended/normal bowel sounds.overweight Ext: no edema Skin: warm, dry  Assessment/Plan:  HTN (hypertension) S: controlled on irbesartan hctz 150-12.5 mg (takes 2 tablets)  He is concerned as out of this office BPs have been higher BP Readings from Last 3 Encounters:  10/31/17 132/86  08/29/17 (!) 160/91  06/27/17 118/78  A/P: We discussed blood pressure goal of <140/90 but prefer <130/80- he wants to add amlodipine 2.5mg  to his regimen to further lower risks to his heart and for CVA. We willl do this and repeat in 2 months.   Hepatitis  B S: needs to reevaluate HBV DNA. Have been monitoring LFTs.  A/P: LFTs reassuring today. Pending HBV DNA Lab Results  Component Value Date   ALT 17 10/31/2017   AST 17 10/31/2017   ALKPHOS 35 (L) 10/31/2017   BILITOT 0.8 10/31/2017  Had RUQ [place] on 04/26/17- will repeat 2019  Prostate cancer [facility]) He sees Dr. Winter Nov 03, 2017. Follows with Radiation oncology as well. Active surveillance in place- continue to monitor and follow up with urology and radiation oncology  Depressed mood PHQ2 of 0- with no anhedonia or depressed mood seems in much better state than he did just a few months ago. Will move to low priority and screen with standardized phq9s at visits from nursing  CPE 04/25/17 or later is reasonable  Lab/Order associations: Chronic viral hepatitis B without delta agent and without coma ([place]) - Plan: Comprehensive metabolic panel, Hepatitis B DNA, ultraquantitative, PCR  Essential hypertension - Plan: CBC, Comprehensive metabolic panel  Meds ordered this encounter  Medications  . amLODipine (NORVASC) 2.5 MG tablet    Sig: Take 1 tablet (2.5 mg total) by mouth daily.    Dispense:  90 tablet    Refill:  3   Return precautions advised.  Garret Reddish, MD

## 2017-10-31 NOTE — Assessment & Plan Note (Signed)
PHQ2 of 0- with no anhedonia or depressed mood seems in much better state than he did just a few months ago. Will move to low priority and screen with standardized phq9s at visits from nursing

## 2017-10-31 NOTE — Assessment & Plan Note (Signed)
He sees Dr. Winter Nov 03, 2017. Follows with Radiation oncology as well. Active surveillance in place- continue to monitor and follow up with urology and radiation oncology

## 2017-10-31 NOTE — Assessment & Plan Note (Addendum)
S: controlled on irbesartan hctz 150-12.5 mg (takes 2 tablets)  He is concerned as out of this office BPs have been higher BP Readings from Last 3 Encounters:  10/31/17 132/86  08/29/17 (!) 160/91  06/27/17 118/78  A/P: We discussed blood pressure goal of <140/90 but prefer <130/80- he wants to add amlodipine 2.5mg  to his regimen to further lower risks to his heart and for CVA. We willl do this and repeat in 2 months.

## 2017-10-31 NOTE — Assessment & Plan Note (Signed)
S: needs to reevaluate HBV DNA. Have been monitoring LFTs.  A/P: LFTs reassuring today. Pending HBV DNA Lab Results  Component Value Date   ALT 17 10/31/2017   AST 17 10/31/2017   ALKPHOS 35 (L) 10/31/2017   BILITOT 0.8 10/31/2017  Had RUQ [place] on 04/26/17- will repeat 2019

## 2017-10-31 NOTE — Patient Instructions (Addendum)
Since your blood pressure concerns you- lets get more aggressive and have you take 2.5mg  of amlodipine to further lower blood pressure with goal <130/80. If you have lightheadedness with this or other side effects please let us know- otherwise see [place] back in 2 months for a recheck  No other changes today  Glad you are feeling better from a mood perspective and that you have so many healthy goals for the year  Please stop by lab before you go

## 2017-11-02 LAB — HEPATITIS B DNA, ULTRAQUANTITATIVE, PCR
HEPATITIS B DNA (CALC): 2.88 {Log_IU}/mL — AB
Hepatitis B DNA: 757 IU/mL — ABNORMAL HIGH

## 2017-11-14 DIAGNOSIS — M1711 Unilateral primary osteoarthritis, right knee: Secondary | ICD-10-CM | POA: Diagnosis not present

## 2017-11-14 DIAGNOSIS — M25561 Pain in right knee: Secondary | ICD-10-CM | POA: Diagnosis not present

## 2017-11-29 DIAGNOSIS — M1711 Unilateral primary osteoarthritis, right knee: Secondary | ICD-10-CM | POA: Insufficient documentation

## 2017-12-02 DIAGNOSIS — M25561 Pain in right knee: Secondary | ICD-10-CM | POA: Diagnosis not present

## 2017-12-05 DIAGNOSIS — M25561 Pain in right knee: Secondary | ICD-10-CM | POA: Diagnosis not present

## 2017-12-06 ENCOUNTER — Encounter: Payer: Self-pay | Admitting: Family Medicine

## 2017-12-06 ENCOUNTER — Other Ambulatory Visit: Payer: Self-pay

## 2017-12-06 MED ORDER — AMLODIPINE BESYLATE 2.5 MG PO TABS: 2.5000 mg | ORAL_TABLET | Freq: Every day | ORAL | 3 refills | Status: DC

## 2017-12-09 DIAGNOSIS — M25561 Pain in right knee: Secondary | ICD-10-CM | POA: Diagnosis not present

## 2017-12-12 DIAGNOSIS — M25561 Pain in right knee: Secondary | ICD-10-CM | POA: Diagnosis not present

## 2017-12-16 DIAGNOSIS — M25561 Pain in right knee: Secondary | ICD-10-CM | POA: Diagnosis not present

## 2017-12-20 DIAGNOSIS — M25561 Pain in right knee: Secondary | ICD-10-CM | POA: Diagnosis not present

## 2017-12-23 DIAGNOSIS — M1711 Unilateral primary osteoarthritis, right knee: Secondary | ICD-10-CM | POA: Diagnosis not present

## 2017-12-26 DIAGNOSIS — M25561 Pain in right knee: Secondary | ICD-10-CM | POA: Diagnosis not present

## 2017-12-26 DIAGNOSIS — M1711 Unilateral primary osteoarthritis, right knee: Secondary | ICD-10-CM | POA: Diagnosis not present

## 2018-05-08 LAB — PSA: PSA: 6.32

## 2018-05-18 DIAGNOSIS — C61 Malignant neoplasm of prostate: Secondary | ICD-10-CM | POA: Diagnosis not present

## 2018-05-19 ENCOUNTER — Encounter: Payer: Self-pay | Admitting: Family Medicine

## 2018-05-20 ENCOUNTER — Encounter: Payer: Self-pay | Admitting: Family Medicine

## 2018-05-22 ENCOUNTER — Other Ambulatory Visit: Payer: Self-pay

## 2018-05-22 MED ORDER — AMLODIPINE BESYLATE 2.5 MG PO TABS: 2.5000 mg | ORAL_TABLET | Freq: Every day | ORAL | 3 refills | Status: DC

## 2018-05-22 MED ORDER — IRBESARTAN-HYDROCHLOROTHIAZIDE 150-12.5 MG PO TABS: 2.0000 | ORAL_TABLET | Freq: Every day | ORAL | 3 refills | Status: DC

## 2018-07-04 ENCOUNTER — Ambulatory Visit: Payer: Medicare Other

## 2018-07-13 ENCOUNTER — Ambulatory Visit (INDEPENDENT_AMBULATORY_CARE_PROVIDER_SITE_OTHER): Payer: Medicare HMO

## 2018-07-13 DIAGNOSIS — Z23 Encounter for immunization: Secondary | ICD-10-CM

## 2018-11-27 DIAGNOSIS — C61 Malignant neoplasm of prostate: Secondary | ICD-10-CM | POA: Diagnosis not present

## 2018-11-27 LAB — PSA: PSA: 7.16

## 2018-12-04 ENCOUNTER — Encounter: Payer: Self-pay | Admitting: Family Medicine

## 2018-12-04 DIAGNOSIS — R972 Elevated prostate specific antigen [PSA]: Secondary | ICD-10-CM | POA: Diagnosis not present

## 2018-12-04 DIAGNOSIS — C61 Malignant neoplasm of prostate: Secondary | ICD-10-CM | POA: Diagnosis not present

## 2018-12-05 ENCOUNTER — Encounter: Payer: Self-pay | Admitting: Family Medicine

## 2018-12-06 ENCOUNTER — Ambulatory Visit (INDEPENDENT_AMBULATORY_CARE_PROVIDER_SITE_OTHER): Payer: Medicare HMO | Admitting: Family Medicine

## 2018-12-06 ENCOUNTER — Encounter: Payer: Self-pay | Admitting: Family Medicine

## 2018-12-06 VITALS — BP 153/93 | HR 89 | Ht 66.0 in | Wt 177.0 lb

## 2018-12-06 DIAGNOSIS — I10 Essential (primary) hypertension: Secondary | ICD-10-CM | POA: Diagnosis not present

## 2018-12-06 DIAGNOSIS — C61 Malignant neoplasm of prostate: Secondary | ICD-10-CM

## 2018-12-06 DIAGNOSIS — R69 Illness, unspecified: Secondary | ICD-10-CM | POA: Diagnosis not present

## 2018-12-06 DIAGNOSIS — E785 Hyperlipidemia, unspecified: Secondary | ICD-10-CM | POA: Diagnosis not present

## 2018-12-06 DIAGNOSIS — B181 Chronic viral hepatitis B without delta-agent: Secondary | ICD-10-CM

## 2018-12-06 DIAGNOSIS — Z6828 Body mass index (BMI) 28.0-28.9, adult: Secondary | ICD-10-CM

## 2018-12-06 MED ORDER — IRBESARTAN-HYDROCHLOROTHIAZIDE 150-12.5 MG PO TABS: 2.0000 | ORAL_TABLET | Freq: Every day | ORAL | 3 refills | Status: DC

## 2018-12-06 NOTE — Patient Instructions (Addendum)
Video visit   Continue amlodipine 2.5 mg  Add irbesartan hydrochlorothiazide back to her regimen   visit in April-we will keep that to recheck   schedule al lab visit - call in to do this this and hopefully we can do that someti I amme in next week

## 2018-12-06 NOTE — Progress Notes (Signed)
Phone [phone]   Subjective:  Virtual visit via Video note  Our team/I connected with Brenten Credit on 12/06/18 at  9:20 AM EDT by a video enabled telemedicine application (webex) and verified that I am speaking with the correct person using two identifiers.  Location patient: Home Location provider: [facility], office Persons participating in the virtual visit:patient, wife  Our team/I discussed the limitations of evaluation and management by telemedicine and the availability of in person appointments. In light of current covid-19 pandemic, patient also understands that we are trying to protect them by minimizing in office contact if at all possible.  The patient expressed consent for telemedicine visit and agreed to proceed.   ROS-  No chest pain or shortness of breath reported. No headache or blurry vision. (called patient back to verify this). Occasionally some dizziness.   Past Medical History-  Patient Active Problem List   Diagnosis Date Noted  . Prostate cancer ([place]) 08/03/2017    Priority: High  . Hepatitis B     Priority: High  . Depressed mood 06/27/2017    Priority: Medium  . HTN (hypertension) 10/14/2010    Priority: Medium  . BPH associated with nocturia 01/26/2016  . Hyperlipidemia 07/16/2014    Medications- reviewed and updated Current Outpatient Medications  Medication Sig Dispense Refill  . amLODipine (NORVASC) 2.5 MG tablet Take 1 tablet (2.5 mg total) by mouth daily. 90 tablet 3  . aspirin EC 81 MG tablet Take 81 mg by mouth daily.    . irbesartan-hydrochlorothiazide (AVALIDE) 150-12.5 MG tablet Take 2 tablets by mouth daily. 180 tablet 3   No current facility-administered medications for this visit.      Objective:  BP (!) 153/93   Pulse 89   Ht 5\' 6"  (1.676 m)   Wt 177 lb (80.3 kg)   BMI 28.57 kg/m  Gen: NAD, resting comfortably, smiling Lungs: nonlabored, normal respiratory rate  Skin: warm, dry, no obvious rash on skin     Assessment  and Plan   #hypertension S: last year patient was on irbesartan hctz 150-12.5 mg (taking 2 tablets of this) but home BPs were high so we added amlodipine 2.5mg  to his regimen to lower CV risks. Plan was 2 month follow up but he never followed up.  We discovered today patient is only taking the amlodipine and not taking the irbesartan hydrochlorothiazide  Home #s have been high again- most recently 150s-160s over 80s- low 90s for at least3 weeks. Has also noted some higher #s in the past.   Getting on treadmill 20-30 minutes a day  Naproxen was on home med list- warned of how this can raise BP. He has stopped that this week BP Readings from Last 3 Encounters:  12/06/18 (!) 153/93  10/31/17 132/86  08/29/17 (!) 160/91  A/P: Poor control on amlodipine alone-have back and prior irbesartan hydrochlorothiazide-has a visit scheduled in April and we will recheck at that time-she can contact us if not trending down.  He is also going to come by for blood work today or this week  #Prostate cancer S: Patient continues to follow with Dr. Lovena Neighbours. Follows with radiation oncology as well. Continues plan of active surveillance. Planned biopsy in 3 months- psa was 7.1 A/P: Appears stable-continue active surveillance with urology  #Chronic hepatitis B S:we follow HBV DNA every 6-12 months as well as LFTs. Needs repeat RUQ [place] for [place] screening (last 8/20218) A/P: Suspect stable-update LFTs and hepatitis B DNA today - will plan on  RUQ [place] when we are in better position with Covid 19-hopefully by next month when he has a visit  #hyperlipidemia/overweight S:  Reasonably controlled without statin last visit Lab Results  Component Value Date   CHOL 182 04/25/2017   HDL 56.90 04/25/2017   LDLCALC 99 04/25/2017   TRIG 130.0 04/25/2017   CHOLHDL 3 04/25/2017   A/P: Controlled on last check but with poor control in the past- update lipid panel with labs.  Patient encouraged to keep up his exercise in regards to  being overweight   Future Appointments  Date Time Provider [place]  01/09/2019  1:40 PM Marin Olp, MD [facility]   Lab/Order associations: Essential hypertension - Plan: CBC, Comprehensive metabolic panel  Prostate cancer ([place])  Chronic viral hepatitis B without delta agent and without coma ([place]) - Plan: Hepatitis B DNA, ultraquantitative, PCR  Hyperlipidemia, unspecified hyperlipidemia type - Plan: Lipid panel  BMI 28.0-28.9,adult  Meds ordered this encounter  Medications  . irbesartan-hydrochlorothiazide (AVALIDE) 150-12.5 MG tablet    Sig: Take 2 tablets by mouth daily.    Dispense:  180 tablet    Refill:  3    Return precautions advised.  Garret Reddish, MD

## 2018-12-07 ENCOUNTER — Other Ambulatory Visit (INDEPENDENT_AMBULATORY_CARE_PROVIDER_SITE_OTHER): Payer: Medicare HMO

## 2018-12-07 ENCOUNTER — Other Ambulatory Visit: Payer: Self-pay | Admitting: Family Medicine

## 2018-12-07 DIAGNOSIS — E785 Hyperlipidemia, unspecified: Secondary | ICD-10-CM

## 2018-12-07 DIAGNOSIS — I10 Essential (primary) hypertension: Secondary | ICD-10-CM

## 2018-12-07 DIAGNOSIS — B181 Chronic viral hepatitis B without delta-agent: Secondary | ICD-10-CM

## 2018-12-07 DIAGNOSIS — R69 Illness, unspecified: Secondary | ICD-10-CM | POA: Diagnosis not present

## 2018-12-07 LAB — CBC
HCT: 48.8 % (ref 39.0–52.0)
Hemoglobin: 16.8 g/dL (ref 13.0–17.0)
MCHC: 34.4 g/dL (ref 30.0–36.0)
MCV: 95.7 fl (ref 78.0–100.0)
Platelets: 202 10*3/uL (ref 150.0–400.0)
RBC: 5.1 Mil/uL (ref 4.22–5.81)
RDW: 12.7 % (ref 11.5–15.5)
WBC: 6.7 10*3/uL (ref 4.0–10.5)

## 2018-12-07 LAB — LIPID PANEL
CHOL/HDL RATIO: 4
Cholesterol: 193 mg/dL (ref 0–200)
HDL: 51.2 mg/dL (ref >39.00)
LDL Cholesterol: 104 mg/dL — ABNORMAL HIGH (ref 0–99)
NONHDL: 141.67
TRIGLYCERIDES: 188 mg/dL — AB (ref 0.0–149.0)
VLDL: 37.6 mg/dL (ref 0.0–40.0)

## 2018-12-07 LAB — COMPREHENSIVE METABOLIC PANEL
ALK PHOS: 45 U/L (ref 39–117)
ALT: 21 U/L (ref 0–53)
AST: 19 U/L (ref 0–37)
Albumin: 4.7 g/dL (ref 3.5–5.2)
BILIRUBIN TOTAL: 1.4 mg/dL — AB (ref 0.2–1.2)
BUN: 14 mg/dL (ref 6–23)
CO2: 28 mEq/L (ref 19–32)
CREATININE: 1.1 mg/dL (ref 0.40–1.50)
Calcium: 10 mg/dL (ref 8.4–10.5)
Chloride: 102 mEq/L (ref 96–112)
GFR: 65.89 mL/min (ref >60.00)
GLUCOSE: 96 mg/dL (ref 70–99)
Potassium: 4.1 mEq/L (ref 3.5–5.1)
Sodium: 138 mEq/L (ref 135–145)
TOTAL PROTEIN: 7.1 g/dL (ref 6.0–8.3)

## 2018-12-08 ENCOUNTER — Telehealth: Payer: Self-pay

## 2018-12-08 ENCOUNTER — Other Ambulatory Visit: Payer: Self-pay

## 2018-12-08 ENCOUNTER — Encounter: Payer: Self-pay | Admitting: Family Medicine

## 2018-12-08 MED ORDER — ROSUVASTATIN CALCIUM 10 MG PO TABS: 10.0000 mg | ORAL_TABLET | Freq: Every day | ORAL | 3 refills | Status: DC

## 2018-12-08 NOTE — Telephone Encounter (Signed)
This has been sent to the pharmacy.  See MyChart message.

## 2018-12-08 NOTE — Telephone Encounter (Signed)
Please see message . Thank you .

## 2018-12-08 NOTE — Telephone Encounter (Signed)
Your cholesterol was elevated-ten-year risk of heart attack or stroke was 28%. I know we are monitoring for the hepatitis B (that test is still pending) but I think it would be reasonable to try low-dose cholesterol medicine such as rosuvastatin 10 mg once a week #90 with 3 refills if patient agrees-please offer this and send in and if he agrees.   From the note- it says that [facility] team can send this in for him if he agrees. Please send this in if he agrees

## 2018-12-12 MED ORDER — HYDROCHLOROTHIAZIDE 25 MG PO TABS: 25.0000 mg | ORAL_TABLET | Freq: Every day | ORAL | 1 refills | Status: DC

## 2018-12-12 NOTE — Telephone Encounter (Signed)
Her pharmacy -   Pharmacy comment: Product Backordered/Unavailable:PA REQUIRED INS MAX ONCE A DAY PLEASE SEND AS 2 RXS IRBESARTAM 300 & HCTZ 25.  OK to change to 2 rxs?  Forwarding to Dr. Yong Channel

## 2018-12-12 NOTE — Telephone Encounter (Signed)
Yes thanks-you may change to two Rx

## 2018-12-12 NOTE — Telephone Encounter (Signed)
Called pt and advised of rx change. Pt verbalized understanding.

## 2018-12-16 LAB — HEPATITIS B DNA, ULTRAQUANTITATIVE, PCR
HEPATITIS B DNA: 860 [IU]/mL — AB
Hepatitis B DNA (Calc): 2.93 Log IU/mL — ABNORMAL HIGH

## 2018-12-18 ENCOUNTER — Encounter: Payer: Self-pay | Admitting: Family Medicine

## 2019-01-01 ENCOUNTER — Other Ambulatory Visit: Payer: Self-pay | Admitting: Family Medicine

## 2019-01-09 ENCOUNTER — Encounter: Payer: BLUE CROSS/BLUE SHIELD | Admitting: Family Medicine

## 2019-02-06 DIAGNOSIS — H40053 Ocular hypertension, bilateral: Secondary | ICD-10-CM | POA: Diagnosis not present

## 2019-02-06 DIAGNOSIS — H5203 Hypermetropia, bilateral: Secondary | ICD-10-CM | POA: Diagnosis not present

## 2019-02-27 DIAGNOSIS — C61 Malignant neoplasm of prostate: Secondary | ICD-10-CM | POA: Diagnosis not present

## 2019-03-05 DIAGNOSIS — R972 Elevated prostate specific antigen [PSA]: Secondary | ICD-10-CM | POA: Diagnosis not present

## 2019-03-12 ENCOUNTER — Encounter: Payer: Self-pay | Admitting: Internal Medicine

## 2019-04-19 ENCOUNTER — Other Ambulatory Visit: Payer: Self-pay

## 2019-04-19 ENCOUNTER — Encounter: Payer: Self-pay | Admitting: Family Medicine

## 2019-04-19 ENCOUNTER — Ambulatory Visit (INDEPENDENT_AMBULATORY_CARE_PROVIDER_SITE_OTHER): Payer: Medicare HMO | Admitting: Family Medicine

## 2019-04-19 VITALS — BP 138/78 | HR 92 | Temp 98.2°F | Ht 66.0 in | Wt 176.6 lb

## 2019-04-19 DIAGNOSIS — Z0000 Encounter for general adult medical examination without abnormal findings: Secondary | ICD-10-CM | POA: Diagnosis not present

## 2019-04-19 DIAGNOSIS — B181 Chronic viral hepatitis B without delta-agent: Secondary | ICD-10-CM | POA: Diagnosis not present

## 2019-04-19 DIAGNOSIS — R69 Illness, unspecified: Secondary | ICD-10-CM | POA: Diagnosis not present

## 2019-04-19 DIAGNOSIS — E663 Overweight: Secondary | ICD-10-CM | POA: Diagnosis not present

## 2019-04-19 DIAGNOSIS — E785 Hyperlipidemia, unspecified: Secondary | ICD-10-CM

## 2019-04-19 LAB — COMPREHENSIVE METABOLIC PANEL
ALT: 26 U/L (ref 0–53)
AST: 19 U/L (ref 0–37)
Albumin: 4.8 g/dL (ref 3.5–5.2)
Alkaline Phosphatase: 34 U/L — ABNORMAL LOW (ref 39–117)
BUN: 21 mg/dL (ref 6–23)
CO2: 26 mEq/L (ref 19–32)
Calcium: 10.2 mg/dL (ref 8.4–10.5)
Chloride: 102 mEq/L (ref 96–112)
Creatinine, Ser: 1.08 mg/dL (ref 0.40–1.50)
GFR: 67.23 mL/min (ref >60.00)
Glucose, Bld: 92 mg/dL (ref 70–99)
Potassium: 3.8 mEq/L (ref 3.5–5.1)
Sodium: 137 mEq/L (ref 135–145)
Total Bilirubin: 1 mg/dL (ref 0.2–1.2)
Total Protein: 7.1 g/dL (ref 6.0–8.3)

## 2019-04-19 LAB — CBC
HCT: 43.6 % (ref 39.0–52.0)
Hemoglobin: 14.9 g/dL (ref 13.0–17.0)
MCHC: 34.2 g/dL (ref 30.0–36.0)
MCV: 94.6 fl (ref 78.0–100.0)
Platelets: 213 10*3/uL (ref 150.0–400.0)
RBC: 4.61 Mil/uL (ref 4.22–5.81)
RDW: 12.9 % (ref 11.5–15.5)
WBC: 7.1 10*3/uL (ref 4.0–10.5)

## 2019-04-19 LAB — LDL CHOLESTEROL, DIRECT: Direct LDL: 42 mg/dL

## 2019-04-19 NOTE — Patient Instructions (Addendum)
Health Maintenance Due  Topic Date Due  . COLONOSCOPY - already scheduled 03/25/2019  . INFLUENZA VACCINE  04/14/2019  We should have flu shots available by September. Please strongly consider getting flu shot this year. If you get your flu shot at a pharmacy- please let us know.   Please stop by lab before you go If you do not have mychart- we will call you about results within 5 business days of [place] receiving them.  If you have mychart- we will send your results within 3 business days of [place] receiving them.  If abnormal or we want to clarify a result, we will call or mychart you to make sure you receive the message.  If you have questions or concerns or don't hear within 5-7 days, please send [place] a message or call us.    We will call you within two weeks about your referral to get updated ultrasound. If you do not hear within 3 weeks, give [place] a call.

## 2019-04-19 NOTE — Progress Notes (Signed)
Phone: [phone]   Subjective:  Patient presents today for their annual physical. Chief complaint-noted.   See problem oriented charting- ROS- full  review of systems was completed and negative except for: back pain which is stable and urinary urgency  The following were reviewed and entered/updated in epic: Past Medical History:  Diagnosis Date  . Cataract    right eye in [place], left here 10/2013  . History of hepatitis B   . Hypertension   . Prostate cancer [facility])    Patient Active Problem List   Diagnosis Date Noted  . Prostate cancer ([place]) 08/03/2017    Priority: High  . Hepatitis B     Priority: High  . Depressed mood 06/27/2017    Priority: Medium  . HTN (hypertension) 10/14/2010    Priority: Medium  . BPH associated with nocturia 01/26/2016  . Hyperlipidemia 07/16/2014   Past Surgical History:  Procedure Laterality Date  . arm trauma     he had an industrial accident in 1991   . EYE SURGERY     right eye cataract  in [place]    . SKIN GRAFT FULL THICKNESS ARM     right arm  . ULNAR NERVE TRANSPOSITION     in 1991    Family History  Problem Relation Age of Onset  . Cancer Father        colon  . Heart disease Father 43  . Kidney disease Father        nephectomy  . Colon cancer Father   . Diabetes Mother 54    Medications- reviewed and updated Current Outpatient Medications  Medication Sig Dispense Refill  . amLODipine (NORVASC) 2.5 MG tablet TAKE 1 TABLET BY MOUTH EVERY DAY 30 tablet 11  . aspirin EC 81 MG tablet Take 81 mg by mouth daily.    . hydrochlorothiazide (HYDRODIURIL) 25 MG tablet Take 1 tablet (25 mg total) by mouth daily. 90 tablet 1  . irbesartan (AVAPRO) 300 MG tablet TAKE 1 TABLET BY MOUTH EVERY DAY 90 tablet 1  . rosuvastatin (CRESTOR) 10 MG tablet Take 1 tablet (10 mg total) by mouth daily. 90 tablet 3   No current facility-administered medications for this visit.     Allergies-reviewed and updated No Known  Allergies  Social History   Social History Narrative   Moved to [place] in 2011 from [place] due to more affordable.       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       Married 1972 [place]). 2 kids. 5 grandkids. 2 in [place], 3 [place]. Son coaches at [employer] for soccer team and is a Writer from there.       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 Oropharynx normal Neck: no thyromegaly or cervical lympadenopathy CV: RRR no murmurs rubs or gallops Lungs: CTAB no crackles, wheeze, rhonchi Abdomen: soft/nontender/nondistended/normal bowel sounds. No rebound or guarding.  Ext: no edema and normal PT pulses Skin: warm, dry Neuro: grossly normal, moves all extremities, PERRLA  Assessment and Plan  73 y.o. male presenting for annual physical.  Health Maintenance counseling: 1. Anticipatory guidance: Patient counseled regarding regular dental exams - he wears dentures though, eye exams -yearly,  avoiding smoking and second hand smoke , limiting alcohol to 2 beverages per day - one drink per week.   2. Risk factor reduction:  Advised patient of need for regular exercise and diet rich and fruits and vegetables to reduce risk of heart attack and stroke. Exercise- doing some jogging on machine at home but hurts back, does yardwork and doesn't bother his back. Diet-trying to eat a reasonably healthy diet and watch portion control. He wants to be down 10 lbs in next year Wt Readings from Last 3 Encounters:  04/19/19 176 lb 9.6 oz (80.1 kg)  12/06/18 177 lb (80.3 kg)  10/31/17 175 lb 3.2 oz (79.5 kg)  3. Immunizations/screenings/ancillary  studies- shingrix - defers due to covid, advised fall flu shot.  Immunization History  Administered Date(s) Administered  . Influenza Split 07/13/2011  . Influenza, High Dose Seasonal PF 07/27/2016, 06/27/2017  . Influenza,inj,Quad PF,6+ Mos 07/01/2014, 08/18/2015, 07/13/2018  . Influenza-Unspecified 06/13/2014  . Pneumococcal Conjugate-13 03/11/2014  . Pneumococcal Polysaccharide-23 09/13/2008  . Pneumococcal-Unspecified 03/13/2014  . Td 10/14/2008  . Tdap 09/16/2011  . Zoster 09/14/2014  4. Prostate cancer follow up- history of prostate cancer- benign biopsy 02/2019 and will continue to follow up with them (prior on active surveillance 3=#=6 gleason.  Urologist Dr. Lovena Neighbours.  Lab Results  Component Value Date   PSA 7.16 11/27/2018   PSA 6.32 05/08/2018   PSA 5.85 10/17/2017   5. Colon cancer screening -  now due for follow up  Due to adenoma 03/24/16- already scheduled 6. Skin cancer screening- no dermatologist. advised regular sunscreen use. Denies worrisome, changing, or new skin lesions. Tries to wear hat if out 7. former smoker- quit 1990 and under 10 pack years. AAA scan offered- he declines.   Status of chronic or acute concerns  Hypertension - Taking Amlodipine 2.5 mg daily, HCTZ 25 mg daily,and Irbesartan 300 mg daily. Checking BP at home, it has been running in the 120s/60s. D Following low sodium diet.   Hep B -  Get Hep B viral load, LFTs and updated ultrasound   Hyperlipidemia - Taking Rosuvastatin 10 mg daily. Tolerating well. Update lipids  Urinary urgency from bph/prostate cancer (though last biopsy normal)  Stable back pain - has plans to see an orthopedist when things better from covid 19  Depression resolved 2019 and has not recurred  Recommended follow up: 6 month follow up Future Appointments  Date Time Provider [place]  04/23/2019 11:00 AM [facility] PREVISIT RM 51 [facility]  05/08/2019 11:00 AM Irene Shipper, MD [facility]     Lab/Order associations: NOT fasting    ICD-10-CM   1. Preventative health care  Z00.00 CBC    Comprehensive metabolic panel    Hepatitis B DNA, ultraquantitative, PCR    LDL cholesterol, direct    US Abdomen Limited RUQ  2. Chronic viral hepatitis B without delta agent and without coma (HCC)  B18.1 Comprehensive metabolic panel    Hepatitis B DNA, ultraquantitative, PCR    US Abdomen Limited RUQ  3. Hyperlipidemia, unspecified hyperlipidemia type  E78.5 CBC    LDL cholesterol, direct  4. Overweight  E66.3     No orders of the defined types were placed in this encounter.   Return precautions advised.  Garret Reddish, MD

## 2019-04-23 ENCOUNTER — Other Ambulatory Visit: Payer: Self-pay

## 2019-04-23 ENCOUNTER — Ambulatory Visit: Payer: Medicare HMO | Admitting: *Deleted

## 2019-04-23 VITALS — Ht 65.0 in | Wt 176.0 lb

## 2019-04-23 DIAGNOSIS — Z86010 Personal history of colonic polyps: Secondary | ICD-10-CM

## 2019-04-23 MED ORDER — NA SULFATE-K SULFATE-MG SULF 17.5-3.13-1.6 GM/177ML PO SOLN: ORAL | 0 refills | Status: DC

## 2019-04-23 NOTE — Progress Notes (Signed)
Patient's pre-visit was done today over the phone with the patient due to COVID-19 pandemic. Name,DOB and address verified. Insurance verified. Packet of Prep instructions mailed to patient including copy of a consent form and pre-procedure patient acknowledgement form-pt is aware.  Patient understands to call us back with any questions or concerns.  Patient denies any allergies to eggs or soy. Patient denies any problems with anesthesia/sedation. Patient denies any oxygen use at home. Patient denies taking any diet/weight loss medications or blood thinners. Pt is aware that care partner will wait in the car during procedure; if they feel like they will be too hot to wait in the car; they may wait in the lobby.  We want them to wear a mask (we do not have any that we can provide them), practice social distancing, and we will check their temperatures when they get here.  I did remind patient that their care partner needs to stay in the parking lot the entire time. Pt will wear mask into building. 

## 2019-04-26 LAB — HEPATITIS B DNA, ULTRAQUANTITATIVE, PCR
Hepatitis B DNA (Calc): 2.88 Log IU/mL — ABNORMAL HIGH
Hepatitis B DNA: 752 IU/mL — ABNORMAL HIGH

## 2019-05-02 ENCOUNTER — Ambulatory Visit
Admission: RE | Admit: 2019-05-02 | Discharge: 2019-05-02 | Disposition: A | Discharge status: Home / Self Care | Payer: Medicare HMO | Source: Ambulatory Visit | Attending: Family Medicine | Admitting: Family Medicine

## 2019-05-02 DIAGNOSIS — B181 Chronic viral hepatitis B without delta-agent: Secondary | ICD-10-CM

## 2019-05-02 DIAGNOSIS — Z0000 Encounter for general adult medical examination without abnormal findings: Secondary | ICD-10-CM

## 2019-05-02 DIAGNOSIS — R69 Illness, unspecified: Secondary | ICD-10-CM | POA: Diagnosis not present

## 2019-05-02 IMAGING — US ULTRASOUND ABDOMEN LIMITED
1 series · 14 of 25 positions shown · non-contrast
Comparison: [DATE]

CLINICAL DATA: Hepatitis-B.

EXAM:
ULTRASOUND ABDOMEN LIMITED RIGHT UPPER QUADRANT

[Series 1: ultrasound abdomen limited · 0.17mm/px · 14 of 49 slices shown]
[im 1/49]
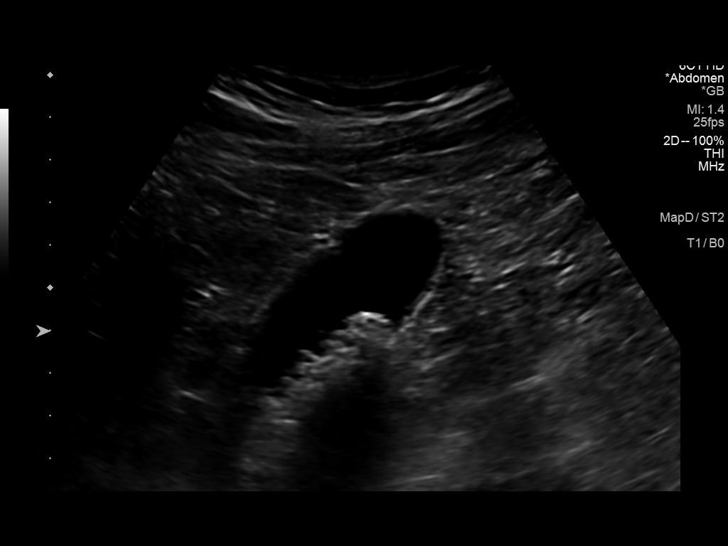
[im 5/49]
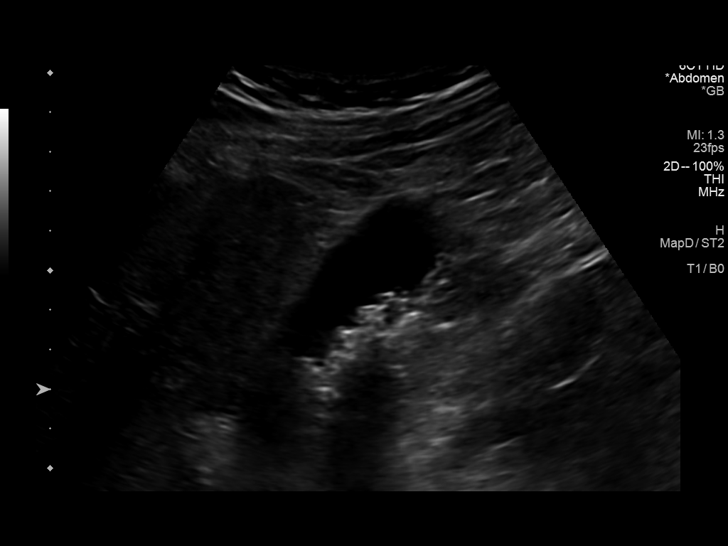
[im 9/49]
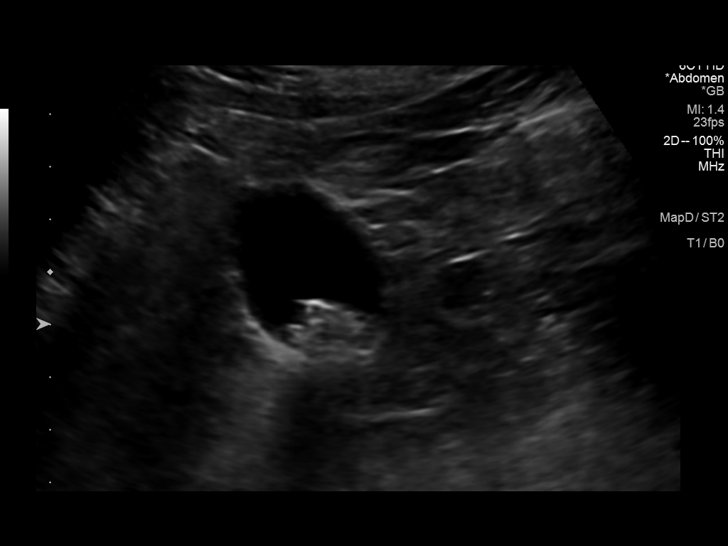
[im 13/49]
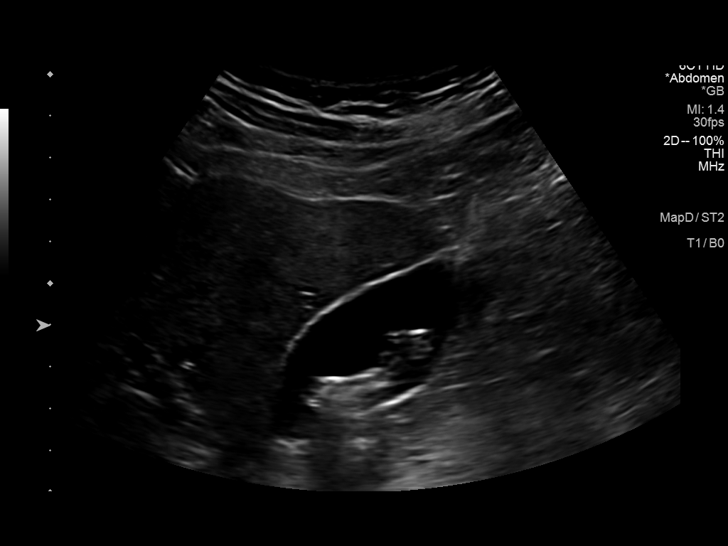
[im 17/49]
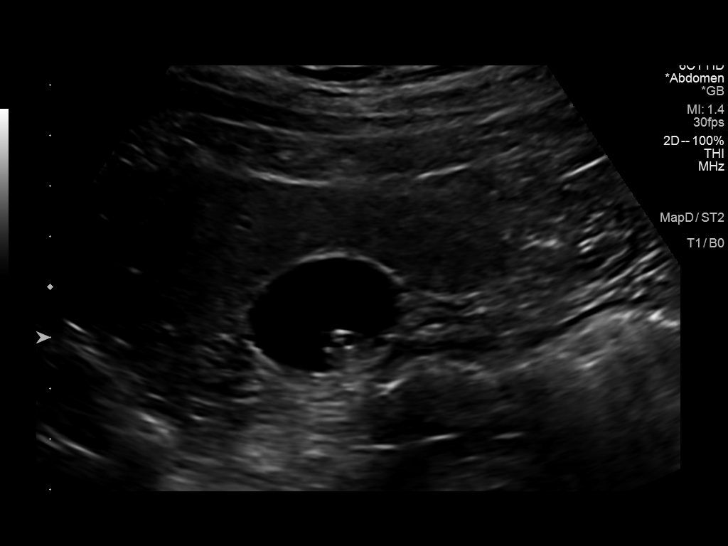
[im 19/49]
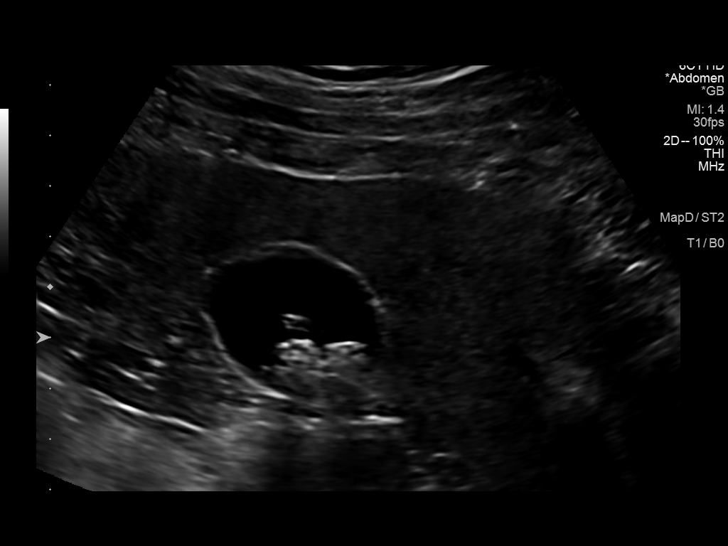
[im 23/49]
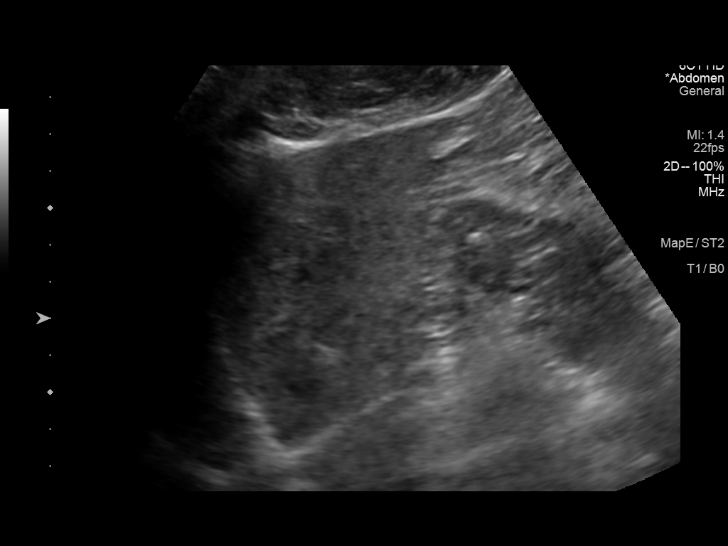
[im 27/49]
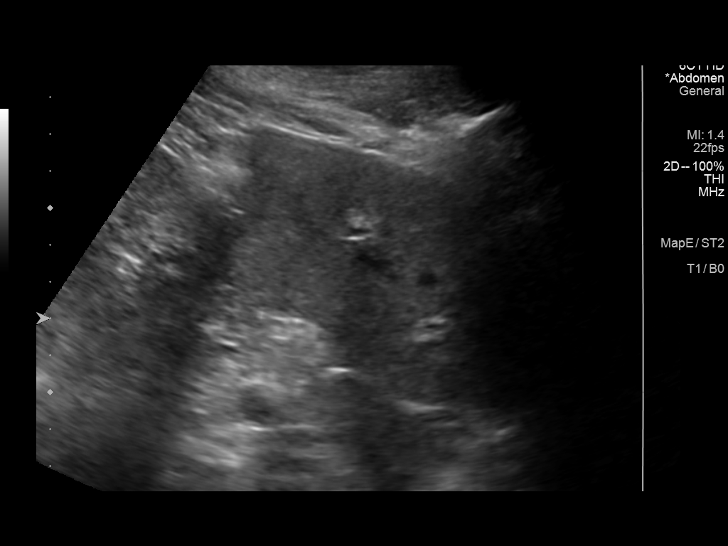
[im 31/49]
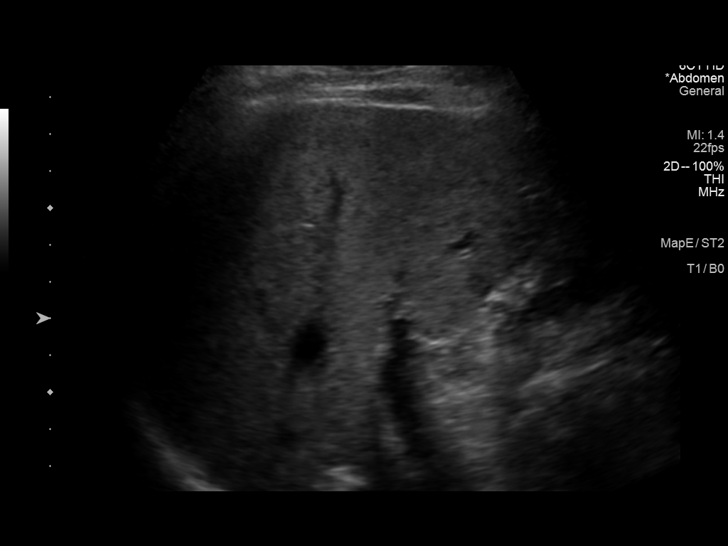
[im 33/49]
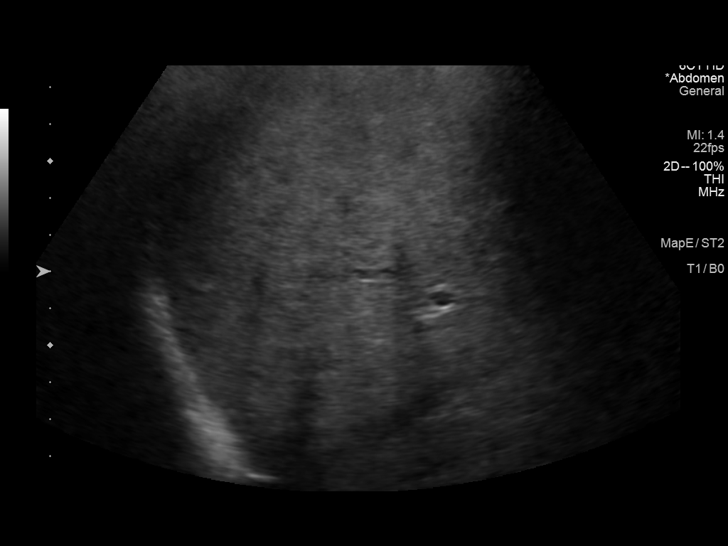
[im 37/49]
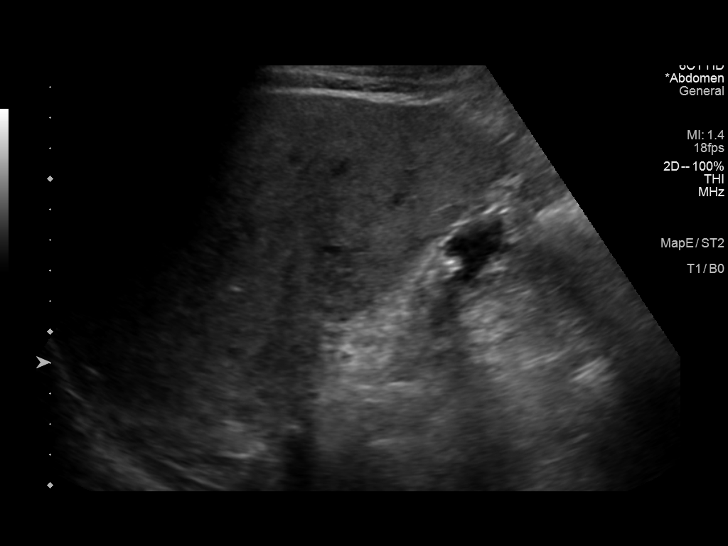
[im 41/49]
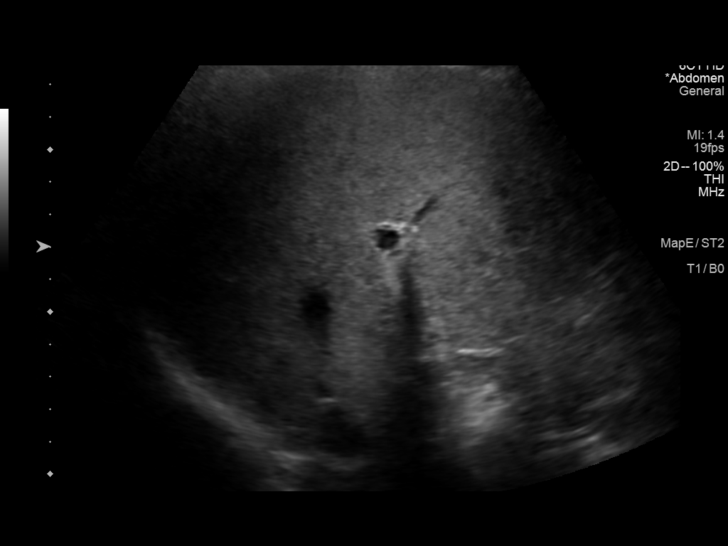
[im 45/49]
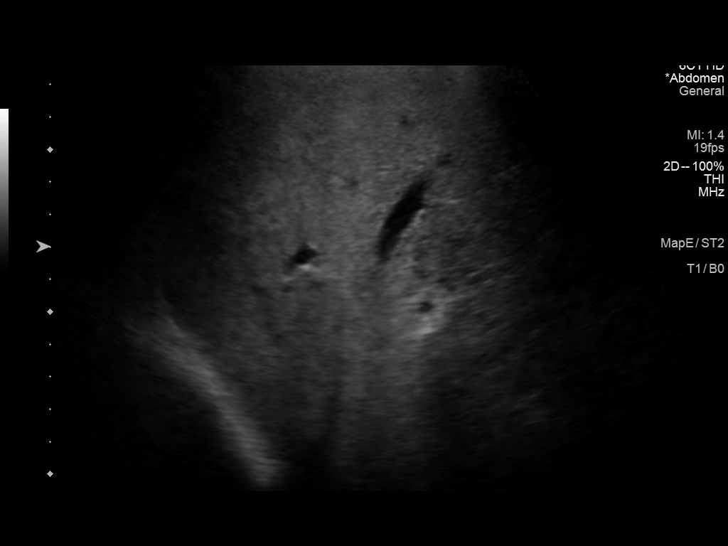
[im 49/49]
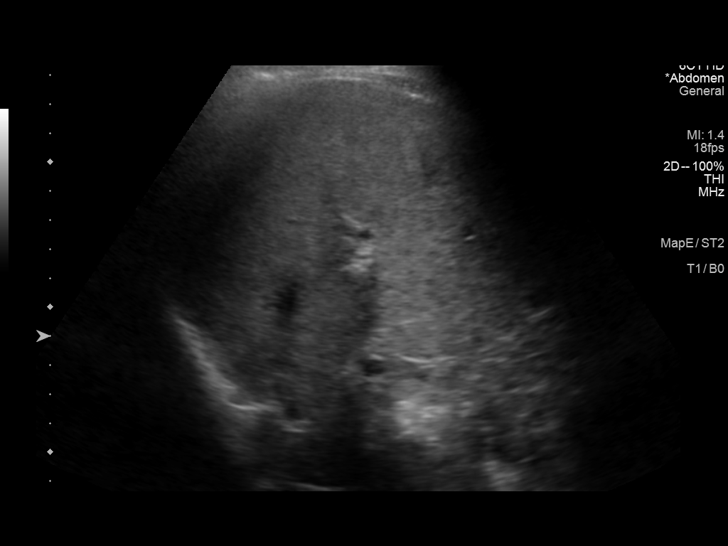

[14 of 25 positions shown; findings below may reference images not displayed]

FINDINGS: Gallbladder:

Gallstones up to 1.7 cm. Sludge. No wall thickening or
pericholecystic fluid. Sonographic Murphy's sign was not elicited.

Common bile duct:

Diameter: Normal, 4 mm.

Liver:

Heterogeneous echotexture. No focal liver lesion. No convincing
evidence of cirrhosis. Portal vein is patent on color Doppler
imaging with normal direction of blood flow towards the liver.

Other: No ascites.
IMPRESSION: No evidence of hepatocellular carcinoma.

Heterogeneous echotexture, likely related to steatosis.

## 2019-05-07 ENCOUNTER — Telehealth: Payer: Self-pay

## 2019-05-07 NOTE — Telephone Encounter (Signed)
Covid-19 screening questions   Do you now or have you had a fever in the last 14 days?  Do you have any respiratory symptoms of shortness of breath or cough now or in the last 14 days?  Do you have any family members or close contacts with diagnosed or suspected Covid-19 in the past 14 days?  Have you been tested for Covid-19 and found to be positive?      NO answer no v/mail. Will try back.

## 2019-05-07 NOTE — Telephone Encounter (Signed)
Patient answered "NO" to all covid-19 questions °

## 2019-05-07 NOTE — Telephone Encounter (Signed)
Per pt's wife she will have the pt c/b.

## 2019-05-08 ENCOUNTER — Other Ambulatory Visit: Payer: Self-pay

## 2019-05-08 ENCOUNTER — Encounter: Payer: Self-pay | Admitting: Internal Medicine

## 2019-05-08 ENCOUNTER — Ambulatory Visit (AMBULATORY_SURGERY_CENTER): Payer: Medicare HMO | Admitting: Internal Medicine

## 2019-05-08 VITALS — BP 123/42 | HR 92 | Temp 97.8°F | Resp 15 | Ht 66.0 in | Wt 176.0 lb

## 2019-05-08 DIAGNOSIS — D120 Benign neoplasm of cecum: Secondary | ICD-10-CM | POA: Diagnosis not present

## 2019-05-08 DIAGNOSIS — D123 Benign neoplasm of transverse colon: Secondary | ICD-10-CM

## 2019-05-08 DIAGNOSIS — D128 Benign neoplasm of rectum: Secondary | ICD-10-CM

## 2019-05-08 DIAGNOSIS — Z86010 Personal history of colonic polyps: Secondary | ICD-10-CM | POA: Diagnosis not present

## 2019-05-08 DIAGNOSIS — D122 Benign neoplasm of ascending colon: Secondary | ICD-10-CM | POA: Diagnosis not present

## 2019-05-08 MED ORDER — SODIUM CHLORIDE 0.9 % IV SOLN: 500.0000 mL | Freq: Once | INTRAVENOUS | Status: DC

## 2019-05-08 NOTE — Patient Instructions (Signed)
YOU HAD AN ENDOSCOPIC PROCEDURE TODAY AT [facility]:   Refer to the procedure report that was given to you for any specific questions about what was found during the examination.  If the procedure report does not answer your questions, please call your gastroenterologist to clarify.  If you requested that your care partner not be given the details of your procedure findings, then the procedure report has been included in a sealed envelope for you to review at your convenience later.  YOU SHOULD EXPECT: Some feelings of bloating in the abdomen. Passage of more gas than usual.  Walking can help get rid of the air that was put into your GI tract during the procedure and reduce the bloating. If you had a lower endoscopy (such as a colonoscopy or flexible sigmoidoscopy) you may notice spotting of blood in your stool or on the toilet paper. If you underwent a bowel prep for your procedure, you may not have a normal bowel movement for a few days.  Please Note:  You might notice some irritation and congestion in your nose or some drainage.  This is from the oxygen used during your procedure.  There is no need for concern and it should clear up in a day or so.  SYMPTOMS TO REPORT IMMEDIATELY:   Following lower endoscopy (colonoscopy or flexible sigmoidoscopy):  Excessive amounts of blood in the stool  Significant tenderness or worsening of abdominal pains  Swelling of the abdomen that is new, acute  Fever of 100F or higher  For urgent or emergent issues, a gastroenterologist can be reached at any hour by calling [phone].  DIET:  We do recommend a small meal at first, but then you may proceed to your regular diet.  Drink plenty of fluids but you should avoid alcoholic beverages for 24 hours.  ACTIVITY:  You should plan to take it easy for the rest of today and you should NOT DRIVE or use heavy machinery until tomorrow (because of the sedation medicines used during the test).     FOLLOW UP: Our staff will call the number listed on your records 48-72 hours following your procedure to check on you and address any questions or concerns that you may have regarding the information given to you following your procedure. If we do not reach you, we will leave a message.  We will attempt to reach you two times.  During this call, we will ask if you have developed any symptoms of COVID 19. If you develop any symptoms (ie: fever, flu-like symptoms, shortness of breath, cough etc.) before then, please call [phone].  If you test positive for Covid 19 in the 2 weeks post procedure, please call and report this information to [place].    If any biopsies were taken you will be contacted by phone or by letter within the next 1-3 weeks.  Please call us at [phone] if you have not heard about the biopsies in 3 weeks.   SIGNATURES/CONFIDENTIALITY: You and/or your care partner have signed paperwork which will be entered into your electronic medical record.  These signatures attest to the fact that that the information above on your After Visit Summary has been reviewed and is understood.  Full responsibility of the confidentiality of this discharge information lies with you and/or your care-partner.  Await pathology  Please read over handouts about polyps, diverticulosis and hemorrhoids  Next colonoscopy- 3 years  Continue your normal medications

## 2019-05-08 NOTE — Op Note (Signed)
[facility] Patient Name: Anthony Mann Procedure Date: 05/08/2019 11:27 AM MRN: [id number] Endoscopist: Docia Chuck. [name] , MD Age: 73 Referring MD:  Date of Birth: [birth year] Gender: Male Account #: [id number] Procedure:                Colonoscopy with cold snare polypectomy x 6 Indications:              High risk colon cancer surveillance: Personal                            history of multiple (3 or more) adenomas. Previous                            colonoscopies elsewhere and here including 2008 and                            2017 Medicines:                Monitored Anesthesia Care Procedure:                Pre-Anesthesia Assessment:                           - Prior to the procedure, a History and Physical                            was performed, and patient medications and                            allergies were reviewed. The patient's tolerance of                            previous anesthesia was also reviewed. The risks                            and benefits of the procedure and the sedation                            options and risks were discussed with the patient.                            All questions were answered, and informed consent                            was obtained. Prior Anticoagulants: The patient has                            taken no previous anticoagulant or antiplatelet                            agents. ASA Grade Assessment: II - A patient with                            mild systemic disease. After reviewing the risks  and benefits, the patient was deemed in                            satisfactory condition to undergo the procedure.                           After obtaining informed consent, the colonoscope                            was passed under direct vision. Throughout the                            procedure, the patient's blood pressure, pulse, and                            oxygen saturations were  monitored continuously. The                            Colonoscope was introduced through the anus and                            advanced to the the cecum, identified by                            appendiceal orifice and ileocecal valve. The                            ileocecal valve, appendiceal orifice, and rectum                            were photographed. The quality of the bowel                            preparation was excellent. The colonoscopy was                            performed without difficulty. The patient tolerated                            the procedure well. The bowel preparation used was                            SUPREP via split dose instruction. Scope In: 11:37:35 AM Scope Out: 11:50:34 AM Scope Withdrawal Time: 0 hours 11 minutes 6 seconds  Total Procedure Duration: 0 hours 12 minutes 59 seconds  Findings:                 Six polyps were found in the rectum, transverse                            colon, ascending colon and cecum. The polyps were 2                            to 5 mm in size. These polyps were removed  with a                            cold snare. Resection and retrieval were complete.                           Multiple diverticula were found in the left colon                            and right colon. Internal hemorrhoids.                           The exam was otherwise without abnormality on                            direct and retroflexion views. Complications:            No immediate complications. Estimated blood loss:                            None. Estimated Blood Loss:     Estimated blood loss: none. Impression:               - Six 2 to 5 mm polyps in the rectum, in the                            transverse colon, in the ascending colon and in the                            cecum, removed with a cold snare. Resected and                            retrieved.                           - Diverticulosis in the left colon and in the right                             colon. Internal hemorrhoids.                           - The examination was otherwise normal on direct                            and retroflexion views. Recommendation:           - Repeat colonoscopy in 3 years for surveillance.                           - Patient has a contact number available for                            emergencies. The signs and symptoms of potential                            delayed complications were discussed with the  patient. Return to normal activities tomorrow.                            Written discharge instructions were provided to the                            patient.                           - Resume previous diet.                           - Continue present medications.                           - Await pathology results. Docia Chuck. [name], MD 05/08/2019 12:01:09 PM This report has been signed electronically.

## 2019-05-08 NOTE — Progress Notes (Signed)
PT taken to PACU. Monitors in place. VSS. Report given to RN. 

## 2019-05-08 NOTE — Progress Notes (Signed)
Temp  MB Vitals CW  Pt's states no medical or surgical changes since previsit or office visit.Pt's states no medical or surgical changes since previsit or office visit.

## 2019-05-10 ENCOUNTER — Telehealth: Payer: Self-pay | Admitting: *Deleted

## 2019-05-10 ENCOUNTER — Encounter: Payer: Self-pay | Admitting: Internal Medicine

## 2019-05-10 NOTE — Telephone Encounter (Signed)
Unable to leave message on f/u call .Amswering machine came on but turned off mid way through recording

## 2019-05-10 NOTE — Telephone Encounter (Signed)
  Follow up Call-  Call back number 05/08/2019  Post procedure Call Back phone  # [phone]  Permission to leave phone message Yes  Some recent data might be hidden     Patient questions:  Do you have a fever, pain , or abdominal swelling? No. Pain Score  0 *  Have you tolerated food without any problems? Yes.    Have you been able to return to your normal activities? Yes.    Do you have any questions about your discharge instructions: Diet   No. Medications  No. Follow up visit  No.  Do you have questions or concerns about your Care? No.  Actions: * If pain score is 4 or above: No action needed, pain <4.  1. Have you developed a fever since your procedure? no  2.   Have you had an respiratory symptoms (SOB or cough) since your procedure? no  3.   Have you tested positive for COVID 19 since your procedure no  4.   Have you had any family members/close contacts diagnosed with the COVID 19 since your procedure?  no   If yes to any of these questions please route to Joylene John, RN and Alphonsa Gin, Therapist, sports.

## 2019-05-11 ENCOUNTER — Encounter: Payer: Self-pay | Admitting: Family Medicine

## 2019-05-11 DIAGNOSIS — Z86010 Personal history of colonic polyps: Secondary | ICD-10-CM | POA: Insufficient documentation

## 2019-06-04 ENCOUNTER — Other Ambulatory Visit: Payer: Self-pay | Admitting: Family Medicine

## 2019-06-20 DIAGNOSIS — R69 Illness, unspecified: Secondary | ICD-10-CM | POA: Diagnosis not present

## 2019-08-16 DIAGNOSIS — C61 Malignant neoplasm of prostate: Secondary | ICD-10-CM | POA: Diagnosis not present

## 2019-08-17 LAB — PSA: PSA: 12.4

## 2019-08-29 ENCOUNTER — Encounter: Payer: Self-pay | Admitting: *Deleted

## 2019-09-11 ENCOUNTER — Encounter: Payer: Self-pay | Admitting: Family Medicine

## 2019-10-05 ENCOUNTER — Ambulatory Visit: Payer: Medicare HMO | Attending: Internal Medicine

## 2019-10-05 DIAGNOSIS — Z23 Encounter for immunization: Secondary | ICD-10-CM | POA: Insufficient documentation

## 2019-10-05 NOTE — Progress Notes (Signed)
   Covid-19 Vaccination Clinic  Name:  Anthony Mann    MRN: [id number] DOB: 06-Mar-1946  10/05/2019  Anthony Mann was observed post Covid-19 immunization for 15 minutes without incidence. He was provided with Vaccine Information Sheet and instruction to access the V-Safe system.   Anthony Mann was instructed to call 911 with any severe reactions post vaccine: Marland Kitchen Difficulty breathing  . Swelling of your face and throat  . A fast heartbeat  . A bad rash all over your body  . Dizziness and weakness    Immunizations Administered    Name Date Dose VIS Date Route   Pfizer COVID-19 Vaccine 10/05/2019 11:13 AM 0.3 mL 08/24/2019 Intramuscular   Manufacturer: [place]   Lot: [id number]   [place]: [id number]

## 2019-10-26 ENCOUNTER — Ambulatory Visit: Payer: Medicare HMO | Attending: Internal Medicine

## 2019-10-26 ENCOUNTER — Other Ambulatory Visit: Payer: Self-pay

## 2019-10-26 DIAGNOSIS — Z23 Encounter for immunization: Secondary | ICD-10-CM | POA: Insufficient documentation

## 2019-10-26 NOTE — Progress Notes (Signed)
   Covid-19 Vaccination Clinic  Name:  Anthony Mann    MRN: [id number] DOB: 1945-11-12  10/26/2019  Anthony Mann was observed post Covid-19 immunization for 15 minutes without incidence. He was provided with Vaccine Information Sheet and instruction to access the V-Safe system.   Anthony Mann was instructed to call 911 with any severe reactions post vaccine: Marland Kitchen Difficulty breathing  . Swelling of your face and throat  . A fast heartbeat  . A bad rash all over your body  . Dizziness and weakness    Immunizations Administered    Name Date Dose VIS Date Route   Pfizer COVID-19 Vaccine 10/26/2019  2:56 PM 0.3 mL 08/24/2019 Intramuscular   Manufacturer: [place]   Lot: [id number]   [place]: [id number]

## 2019-10-30 DIAGNOSIS — K219 Gastro-esophageal reflux disease without esophagitis: Secondary | ICD-10-CM | POA: Diagnosis not present

## 2019-11-05 ENCOUNTER — Ambulatory Visit (INDEPENDENT_AMBULATORY_CARE_PROVIDER_SITE_OTHER): Payer: Medicare HMO

## 2019-11-05 ENCOUNTER — Ambulatory Visit (INDEPENDENT_AMBULATORY_CARE_PROVIDER_SITE_OTHER): Payer: Medicare HMO | Admitting: Family Medicine

## 2019-11-05 ENCOUNTER — Other Ambulatory Visit: Payer: Self-pay

## 2019-11-05 ENCOUNTER — Encounter: Payer: Self-pay | Admitting: Family Medicine

## 2019-11-05 VITALS — BP 136/78 | HR 78 | Temp 97.9°F | Ht 66.0 in | Wt 183.2 lb

## 2019-11-05 VITALS — BP 142/78 | HR 78 | Temp 97.9°F | Ht 66.0 in | Wt 183.2 lb

## 2019-11-05 DIAGNOSIS — C61 Malignant neoplasm of prostate: Secondary | ICD-10-CM | POA: Diagnosis not present

## 2019-11-05 DIAGNOSIS — R69 Illness, unspecified: Secondary | ICD-10-CM | POA: Diagnosis not present

## 2019-11-05 DIAGNOSIS — Z0000 Encounter for general adult medical examination without abnormal findings: Secondary | ICD-10-CM

## 2019-11-05 DIAGNOSIS — I10 Essential (primary) hypertension: Secondary | ICD-10-CM | POA: Diagnosis not present

## 2019-11-05 DIAGNOSIS — E785 Hyperlipidemia, unspecified: Secondary | ICD-10-CM

## 2019-11-05 DIAGNOSIS — B181 Chronic viral hepatitis B without delta-agent: Secondary | ICD-10-CM

## 2019-11-05 LAB — CBC WITH DIFFERENTIAL/PLATELET
Basophils Absolute: 0 10*3/uL (ref 0.0–0.1)
Basophils Relative: 0.6 % (ref 0.0–3.0)
Eosinophils Absolute: 0.1 10*3/uL (ref 0.0–0.7)
Eosinophils Relative: 2 % (ref 0.0–5.0)
HCT: 44.9 % (ref 39.0–52.0)
Hemoglobin: 15.4 g/dL (ref 13.0–17.0)
Lymphocytes Relative: 32.6 % (ref 12.0–46.0)
Lymphs Abs: 2.4 10*3/uL (ref 0.7–4.0)
MCHC: 34.4 g/dL (ref 30.0–36.0)
MCV: 94.4 fl (ref 78.0–100.0)
Monocytes Absolute: 0.7 10*3/uL (ref 0.1–1.0)
Monocytes Relative: 10 % (ref 3.0–12.0)
Neutro Abs: 4 10*3/uL (ref 1.4–7.7)
Neutrophils Relative %: 54.8 % (ref 43.0–77.0)
Platelets: 216 10*3/uL (ref 150.0–400.0)
RBC: 4.75 Mil/uL (ref 4.22–5.81)
RDW: 12.6 % (ref 11.5–15.5)
WBC: 7.3 10*3/uL (ref 4.0–10.5)

## 2019-11-05 NOTE — Assessment & Plan Note (Signed)
Continues on active surveillance. Reassuring biopsy 02/2019. Continues to get PSA/MRI as needed as well as biopsy as needed. Continue close follow up with uroloyg

## 2019-11-05 NOTE — Progress Notes (Signed)
Subjective:   Tim Depiano is a 74 y.o. male who presents for an Initial Medicare Annual Wellness Visit.  Review of Systems   Cardiac Risk Factors include: hypertension;male gender;advanced age (>48men, >72 women);dyslipidemia   Objective:    Today's Vitals   11/05/19 0959  BP: (!) 142/78  Pulse: 78  Temp: 97.9 F (36.6 C)  TempSrc: Temporal  SpO2: 98%  Weight: 183 lb 3.2 oz (83.1 kg)  Height: 5\' 6"  (1.676 m)   Body mass index is 29.57 kg/m.  Advanced Directives 11/05/2019 03/10/2016  Does Patient Have a Medical Advance Directive? Yes No  Type of Advance Directive Living will;Healthcare Power of Attorney -  Does patient want to make changes to medical advance directive? No - Patient declined -  Copy of [place] in Chart? No - copy requested -  Would patient like information on creating a medical advance directive? - No - patient declined information    Current Medications (verified) Outpatient Encounter Medications as of 11/05/2019  Medication Sig  . amLODipine (NORVASC) 2.5 MG tablet TAKE 1 TABLET BY MOUTH EVERY DAY  . aspirin EC 81 MG tablet Take 81 mg by mouth daily.  . hydrochlorothiazide (HYDRODIURIL) 25 MG tablet TAKE 1 TABLET BY MOUTH EVERY DAY  . irbesartan (AVAPRO) 300 MG tablet TAKE 1 TABLET BY MOUTH EVERY DAY  . rosuvastatin (CRESTOR) 10 MG tablet Take 1 tablet (10 mg total) by mouth daily.   No facility-administered encounter medications on file as of 11/05/2019.    Allergies (verified) Patient has no known allergies.   History: Past Medical History:  Diagnosis Date  . Cataract    right eye in [place], left here 10/2013  . History of hepatitis B   . Hyperlipidemia   . Hypertension   . Prostate cancer ([place]) 2018   Past Surgical History:  Procedure Laterality Date  . arm trauma     he had an industrial accident in 1991   . COLONOSCOPY  03/24/2016  . EYE SURGERY     right eye cataract  in [place]  last July 2020   negative results per pt  . SKIN GRAFT FULL THICKNESS ARM     right arm  . ULNAR NERVE TRANSPOSITION     in 1991   Family History  Problem Relation Age of Onset  . Cancer Father        colon  . Heart disease Father 59  . Kidney disease Father        nephectomy  . Colon cancer Father 106  . Diabetes Mother 60  . Colon polyps Neg Hx   . Esophageal cancer Neg Hx   . Rectal cancer Neg Hx   . Stomach cancer Neg Hx    Social History   Socioeconomic History  . Marital status: Married    Spouse name: donna  . Number of children: 2  . Years of education: Not on file  . Highest education level: Not on file  Occupational History  . Occupation: retired    Comment: Control and instrumentation engineer  Tobacco Use  . Smoking status: Former Smoker    Packs/day: 0.50    Years: 9.00    Pack years: 4.50    Types: Cigarettes    Quit date: 06/13/1989    Years since quitting: 30.4  . Smokeless tobacco: Never Used  Substance and Sexual Activity  . Alcohol use: Yes    Alcohol/week: 3.0 standard drinks    Types: 2 Cans  of beer, 1 Shots of liquor per week    Comment: occasional beer  . Drug use: No  . Sexual activity: Never    Birth control/protection: None  Other Topics Concern  . Not on file  Social History Narrative   Moved to [facility] in 2011 from [place] due to more affordable.       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       Married 1972 [place]). 2 kids. 5 grandkids. 2 in [place], 3 [place]. Son coaches at [employer] for soccer team and is a Writer from there.       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 Difficulty of Paying Living Expenses: Not on file  Food Insecurity:   . Worried About Charity fundraiser in the Last Year: Not on file  . Ran Out of Food in the Last Year: Not on file  Transportation  Needs:   . Lack of Transportation (Medical): Not on file  . Lack of Transportation (Non-Medical): Not on file  Physical Activity:   . Days of Exercise per Week: Not on file  . Minutes of Exercise per Session: Not on file  Stress:   . Feeling of Stress : Not on file  Social Connections:   . Frequency of Communication with Friends and Family: Not on file  . Frequency of Social Gatherings with Friends and Family: Not on file  . Attends Religious Services: Not on file  . Active Member of Clubs or Organizations: Not on file  . Attends Archivist Meetings: Not on file  . Marital Status: Not on file   Tobacco Counseling Counseling given: Not Answered   Clinical Intake:  Pre-visit preparation completed: Yes  Pain : No/denies pain  Diabetes: No  How often do you need to have someone help you when you read instructions, pamphlets, or other written materials from your doctor or pharmacy?: 1 - Never  Interpreter Needed?: No  Information entered by :: Denman George LPN  Activities of Daily Living In your present state of health, do you have any difficulty performing the following activities: 11/05/2019  Hearing? N  Vision? N  Difficulty concentrating or making decisions? N  Walking or climbing stairs? N  Dressing or bathing? N  Doing errands, shopping? N  Preparing Food and eating ? N  Using the Toilet? N  In the past six months, have you accidently leaked urine? N  Do you have problems with loss of bowel control? N  Managing your Medications? N  Managing your Finances? N  Housekeeping or managing your Housekeeping? N  Some recent data might be hidden     Immunizations and Health Maintenance Immunization History  Administered Date(s) Administered  . Influenza Split 07/13/2011  . Influenza, High Dose Seasonal PF 07/27/2016, 06/27/2017, 06/20/2019  . Influenza,inj,Quad PF,6+ Mos 07/01/2014, 08/18/2015, 07/13/2018  . Influenza,inj,quad, With Preservative 07/27/2017   . Influenza-Unspecified 06/13/2014  . PFIZER SARS-COV-2 Vaccination 10/05/2019, 10/26/2019  . Pneumococcal Conjugate-13 03/11/2014  . Pneumococcal Polysaccharide-23 09/13/2008  . Pneumococcal-Unspecified 03/13/2014  . Td 10/14/2008  . Tdap 09/16/2011  . Zoster 09/14/2014   There are no preventive care reminders to display for this patient.  Patient Care Team: Marin Olp, MD as PCP - General (Family Medicine)  Ceasar Mons, MD as Consulting Physician (Urology) Katy Apo, MD as Consulting Physician (Ophthalmology)  Indicate any recent Medical Services you may have received from other than [facility] providers in the past year (date may be approximate).    Assessment:   This is a routine wellness examination for [place].  Hearing/Vision screen No exam data present  Dietary issues and exercise activities discussed: Current Exercise Habits: The patient does not participate in regular exercise at present;Home exercise routine, Type of exercise: treadmill, Time (Minutes): 30, Frequency (Times/Week): 3, Weekly Exercise (Minutes/Week): 90, Intensity: Mild  Goals   None    Depression Screen PHQ 2/9 Scores 11/05/2019 11/05/2019 04/19/2019 04/19/2019  PHQ - 2 Score 0 0 0 0  PHQ- 9 Score - 0 0 -    Fall Risk Fall Risk  11/05/2019 04/19/2019 08/29/2017 04/25/2017 01/26/2016  Falls in the past year? 0 0 No No No  Number falls in past yr: 0 0 - - -  Injury with Fall? 0 0 - - -  Follow up Falls evaluation completed;Education provided;Falls prevention discussed - - - -    Is the patient's home free of loose throw rugs in walkways, pet beds, electrical cords, etc?   yes      Grab bars in the bathroom? yes      Handrails on the stairs?   yes      Adequate lighting?   yes  Timed Get Up and Go performed: completed and within normal timeframe; no gait abnormalities noted   Cognitive Function:     6CIT Screen 11/05/2019  What Year? 0 points  What month? 0 points  What time? 0  points  Count back from 20 0 points  Months in reverse 0 points  Repeat phrase 0 points  Total Score 0    Screening Tests Health Maintenance  Topic Date Due  . TETANUS/TDAP  09/15/2021  . COLONOSCOPY  05/07/2022  . INFLUENZA VACCINE  Completed  . Hepatitis C Screening  Completed  . PNA vac Low Risk Adult  Completed    Qualifies for Shingles Vaccine? Discussed and patient will check with pharmacy for coverage.  Patient education handout provided    Cancer Screenings: Lung: Low Dose CT Chest recommended if Age 61-80 years, 30 pack-year currently smoking OR have quit w/in 15years. Patient does not qualify. Colorectal: colonoscopy 05/08/19; repeat 3 years     Plan:  I have personally reviewed and addressed the Medicare Annual Wellness questionnaire and have noted the following in the patient's chart:  A. Medical and social history B. Use of alcohol, tobacco or illicit drugs  C. Current medications and supplements D. Functional ability and status E.  Nutritional status F.  Physical activity G. Advance directives H. List of other physicians I.  Hospitalizations, surgeries, and ER visits in previous 12 months J.  [place] such as hearing and vision if needed, cognitive and depression L. Referrals, records requested, and appointments- none   In addition, I have reviewed and discussed with patient certain preventive protocols, quality metrics, and best practice recommendations. A written personalized care plan for preventive services as well as general preventive health recommendations were provided to patient.   Signed,  Denman George, LPN  Nurse Health Advisor   Nurse Notes: Patient has completed Covid vaccine

## 2019-11-05 NOTE — Progress Notes (Signed)
Phone [phone] In person visit   Subjective:   Anthony Mann is a 74 y.o. year old very pleasant male patient who presents for/with See problem oriented charting Chief Complaint  Patient presents with  . Follow-up  . Hypertension  . Hyperlipidemia   This visit occurred during the SARS-CoV-2 public health emergency.  Safety protocols were in place, including screening questions prior to the visit, additional usage of staff PPE, and extensive cleaning of exam room while observing appropriate contact time as indicated for disinfecting solutions.   Past Medical History-  Patient Active Problem List   Diagnosis Date Noted  . Prostate cancer ([place]) 08/03/2017    Priority: High  . Hepatitis B     Priority: High  . Depressed mood 06/27/2017    Priority: Medium  . Hyperlipidemia 07/16/2014    Priority: Medium  . HTN (hypertension) 10/14/2010    Priority: Medium  . History of adenomatous polyp of colon 05/11/2019  . BPH associated with nocturia 01/26/2016    Medications- reviewed and updated Current Outpatient Medications  Medication Sig Dispense Refill  . amLODipine (NORVASC) 2.5 MG tablet TAKE 1 TABLET BY MOUTH EVERY DAY 30 tablet 11  . aspirin EC 81 MG tablet Take 81 mg by mouth daily.    . hydrochlorothiazide (HYDRODIURIL) 25 MG tablet TAKE 1 TABLET BY MOUTH EVERY DAY 90 tablet 1  . irbesartan (AVAPRO) 300 MG tablet TAKE 1 TABLET BY MOUTH EVERY DAY 90 tablet 1  . rosuvastatin (CRESTOR) 10 MG tablet Take 1 tablet (10 mg total) by mouth daily. 90 tablet 3   No current facility-administered medications for this visit.     Objective:  BP 136/78   Pulse 78   Temp 97.9 F (36.6 C) (Temporal)   Ht 5\' 6"  (1.676 m)   Wt 183 lb 3.2 oz (83.1 kg)   SpO2 98%   BMI 29.57 kg/m  Gen: NAD, resting comfortably CV: RRR no murmurs rubs or gallops Lungs: CTAB no crackles, wheeze, rhonchi Abdomen: soft/nontender/nondistended/normal bowel sounds.  Ext: no edema Skin: warm, dry      Assessment and Plan   #hypertension S: compliant with Amlodipine 2.5mg , Irbesartan 300mg  and HCTZ 25mg . Pt denies HA, blurred vision and chest discomfort. BP Readings from Last 3 Encounters:  11/05/19 (!) 142/78  11/05/19 136/78  05/08/19 (!) 123/42  A/P: good control on repeat today at 136/78- continue current medicines  #hyperlipidemia S: compliant with Crestor 10mg  Lab Results  Component Value Date   CHOL 193 12/07/2018   HDL 51.20 12/07/2018   LDLCALC 104 (H) 12/07/2018   LDLDIRECT 42.0 04/19/2019   TRIG 188.0 (H) 12/07/2018   CHOLHDL 4 12/07/2018   A/P: good control last check with LDL 42- update full lipid panel today  # overweight S:plans to increase treadmill from 35 mins to an hour (has to be careful with back pain but has beenbetter lately) and improve dietary habits as he is not happy about his 7 lb weight gain  Wt Readings from Last 3 Encounters:  11/05/19 183 lb 3.2 oz (83.1 kg)  11/05/19 183 lb 3.2 oz (83.1 kg)  05/08/19 176 lb (79.8 kg)  A/P: poor control but seems motivated -Encouraged need for healthy eating, regular exercise, weight loss.  - he wants to get down to 160  # following with urology for high PSA- Biopsy 02/2019 was benign  # Back has been more stable recently- holding off on seeing Dr. Arnoldo Morale for now  Prostate cancer [facility]) Continues on active surveillance.  Reassuring biopsy 02/2019. Continues to get PSA/MRI as needed as well as biopsy as needed. Continue close follow up with uroloyg   Recommended follow up: Return in about 6 months (around 05/04/2020) for physical or sooner if needed.   Lab/Order associations: coffee with creamer   ICD-10-CM   1. Chronic viral hepatitis B without delta agent and without coma (HCC)  B18.1 Comprehensive metabolic panel    Hepatitis B DNA, ultraquantitative, PCR  2. Hyperlipidemia, unspecified hyperlipidemia type  E78.5 CBC with Differential/Platelet    Comprehensive metabolic panel    Lipid panel  3.  Essential hypertension  I10 CBC with Differential/Platelet    Comprehensive metabolic panel    Lipid panel  4. Prostate cancer [facility])  C61    Return precautions advised.  Garret Reddish, MD

## 2019-11-05 NOTE — Patient Instructions (Addendum)
Mr. Anthony Mann , Thank you for taking time to come for your Medicare Wellness Visit. I appreciate your ongoing commitment to your health goals. Please review the following plan we discussed and let me know if I can assist you in the future.   Screening recommendations/referrals: Colorectal Screening: up to date; last colonoscopy 05/08/19  Vision and Dental Exams: Recommended annual ophthalmology exams for early detection of glaucoma and other disorders of the eye Recommended annual dental exams for proper oral hygiene  Vaccinations: Influenza vaccine: completed 06/20/19 Pneumococcal vaccine: up to date; last 03/13/14 Tdap vaccine: up to date; last 09/16/11 Shingles vaccine: Please call your insurance company to determine your out of pocket expense for the Shingrix vaccine. You may receive this vaccine at your local pharmacy. (see attached handout)   Advanced directives: Please bring a copy of your POA (Power of Attorney) and/or Living Will to your next appointment.  Goals: Recommend to drink at least 6-8 8oz glasses of water per day and consume a balanced diet rich in fresh fruits and vegetables.   Next appointment: Please schedule your Annual Wellness Visit with your Nurse Health Advisor in one year.  Preventive Care 47 Years and Older, Male Preventive care refers to lifestyle choices and visits with your health care provider that can promote health and wellness. What does preventive care include?  A yearly physical exam. This is also called an annual well check.  Dental exams once or twice a year.  Routine eye exams. Ask your health care provider how often you should have your eyes checked.  Personal lifestyle choices, including:  Daily care of your teeth and gums.  Regular physical activity.  Eating a healthy diet.  Avoiding tobacco and drug use.  Limiting alcohol use.  Practicing safe sex.  Taking low doses of aspirin every day if recommended by your health care  provider..  Taking vitamin and mineral supplements as recommended by your health care provider. What happens during an annual well check? The services and screenings done by your health care provider during your annual well check will depend on your age, overall health, lifestyle risk factors, and family history of disease. Counseling  Your health care provider may ask you questions about your:  Alcohol use.  Tobacco use.  Drug use.  Emotional well-being.  Home and relationship well-being.  Sexual activity.  Eating habits.  History of falls.  Memory and ability to understand (cognition).  Work and work Statistician. Screening  You may have the following tests or measurements:  Height, weight, and BMI.  Blood pressure.  Lipid and cholesterol levels. These may be checked every 5 years, or more frequently if you are over 76 years old.  Skin check.  Lung cancer screening. You may have this screening every year starting at age 89 if you have a 30-pack-year history of smoking and currently smoke or have quit within the past 15 years.  Fecal occult blood test (FOBT) of the stool. You may have this test every year starting at age 40.  Flexible sigmoidoscopy or colonoscopy. You may have a sigmoidoscopy every 5 years or a colonoscopy every 10 years starting at age 22.  Prostate cancer screening. Recommendations will vary depending on your family history and other risks.  Hepatitis C blood test.  Hepatitis B blood test.  Sexually transmitted disease (STD) testing.  Diabetes screening. This is done by checking your blood sugar (glucose) after you have not eaten for a while (fasting). You may have this done every 1-3 years.  Abdominal aortic aneurysm (AAA) screening. You may need this if you are a current or former smoker.  Osteoporosis. You may be screened starting at age 81 if you are at high risk. Talk with your health care provider about your test results, treatment  options, and if necessary, the need for more tests. Vaccines  Your health care provider may recommend certain vaccines, such as:  Influenza vaccine. This is recommended every year.  Tetanus, diphtheria, and acellular pertussis (Tdap, Td) vaccine. You may need a Td booster every 10 years.  Zoster vaccine. You may need this after age 82.  Pneumococcal 13-valent conjugate (PCV13) vaccine. One dose is recommended after age 72.  Pneumococcal polysaccharide (PPSV23) vaccine. One dose is recommended after age 37. Talk to your health care provider about which screenings and vaccines you need and how often you need them. This information is not intended to replace advice given to you by your health care provider. Make sure you discuss any questions you have with your health care provider. Document Released: 09/26/2015 Document Revised: 05/19/2016 Document Reviewed: 07/01/2015 Elsevier Interactive Patient Education  2017 [place] Prevention in the Home Falls can cause injuries. They can happen to people of all ages. There are many things you can do to make your home safe and to help prevent falls. What can I do on the outside of my home?  Regularly fix the edges of walkways and driveways and fix any cracks.  Remove anything that might make you trip as you walk through a door, such as a raised step or threshold.  Trim any bushes or trees on the path to your home.  Use bright outdoor lighting.  Clear any walking paths of anything that might make someone trip, such as rocks or tools.  Regularly check to see if handrails are loose or broken. Make sure that both sides of any steps have handrails.  Any raised decks and porches should have guardrails on the edges.  Have any leaves, snow, or ice cleared regularly.  Use sand or salt on walking paths during winter.  Clean up any spills in your garage right away. This includes oil or grease spills. What can I do in the  bathroom?  Use night lights.  Install grab bars by the toilet and in the tub and shower. Do not use towel bars as grab bars.  Use non-skid mats or decals in the tub or shower.  If you need to sit down in the shower, use a plastic, non-slip stool.  Keep the floor dry. Clean up any water that spills on the floor as soon as it happens.  Remove soap buildup in the tub or shower regularly.  Attach bath mats securely with double-sided non-slip rug tape.  Do not have throw rugs and other things on the floor that can make you trip. What can I do in the bedroom?  Use night lights.  Make sure that you have a light by your bed that is easy to reach.  Do not use any sheets or blankets that are too big for your bed. They should not hang down onto the floor.  Have a firm chair that has side arms. You can use this for support while you get dressed.  Do not have throw rugs and other things on the floor that can make you trip. What can I do in the kitchen?  Clean up any spills right away.  Avoid walking on wet floors.  Keep items that you use  a lot in easy-to-reach places.  If you need to reach something above you, use a strong step stool that has a grab bar.  Keep electrical cords out of the way.  Do not use floor polish or wax that makes floors slippery. If you must use wax, use non-skid floor wax.  Do not have throw rugs and other things on the floor that can make you trip. What can I do with my stairs?  Do not leave any items on the stairs.  Make sure that there are handrails on both sides of the stairs and use them. Fix handrails that are broken or loose. Make sure that handrails are as long as the stairways.  Check any carpeting to make sure that it is firmly attached to the stairs. Fix any carpet that is loose or worn.  Avoid having throw rugs at the top or bottom of the stairs. If you do have throw rugs, attach them to the floor with carpet tape.  Make sure that you have a  light switch at the top of the stairs and the bottom of the stairs. If you do not have them, ask someone to add them for you. What else can I do to help prevent falls?  Wear shoes that:  Do not have high heels.  Have rubber bottoms.  Are comfortable and fit you well.  Are closed at the toe. Do not wear sandals.  If you use a stepladder:  Make sure that it is fully opened. Do not climb a closed stepladder.  Make sure that both sides of the stepladder are locked into place.  Ask someone to hold it for you, if possible.  Clearly mark and make sure that you can see:  Any grab bars or handrails.  First and last steps.  Where the edge of each step is.  Use tools that help you move around (mobility aids) if they are needed. These include:  Canes.  Walkers.  Scooters.  Crutches.  Turn on the lights when you go into a dark area. Replace any light bulbs as soon as they burn out.  Set up your furniture so you have a clear path. Avoid moving your furniture around.  If any of your floors are uneven, fix them.  If there are any pets around you, be aware of where they are.  Review your medicines with your doctor. Some medicines can make you feel dizzy. This can increase your chance of falling. Ask your doctor what other things that you can do to help prevent falls. This information is not intended to replace advice given to you by your health care provider. Make sure you discuss any questions you have with your health care provider. Document Released: 06/26/2009 Document Revised: 02/05/2016 Document Reviewed: 10/04/2014 Elsevier Interactive Patient Education  2017 [employer].

## 2019-11-05 NOTE — Patient Instructions (Addendum)
Please stop by lab before you go If you do not have mychart- we will call you about results within 5 business days of [place] receiving them.  If you have mychart- we will send your results within 3 business days of [place] receiving them.  If abnormal or we want to clarify a result, we will call or mychart you to make sure you receive the message.  If you have questions or concerns or don't hear within 5 business days, please send [place] a message or call us.   Love your plan for weight loss to get back to 176 or lower by 6 month follow up.   Recommended follow up: Return in about 6 months (around 05/04/2020) for physical or sooner if needed.

## 2019-11-06 LAB — COMPREHENSIVE METABOLIC PANEL
ALT: 23 U/L (ref 0–53)
AST: 20 U/L (ref 0–37)
Albumin: 4.8 g/dL (ref 3.5–5.2)
Alkaline Phosphatase: 36 U/L — ABNORMAL LOW (ref 39–117)
BUN: 17 mg/dL (ref 6–23)
CO2: 25 mEq/L (ref 19–32)
Calcium: 10.3 mg/dL (ref 8.4–10.5)
Chloride: 100 mEq/L (ref 96–112)
Creatinine, Ser: 1.08 mg/dL (ref 0.40–1.50)
GFR: 67.12 mL/min (ref >60.00)
Glucose, Bld: 109 mg/dL — ABNORMAL HIGH (ref 70–99)
Potassium: 4 mEq/L (ref 3.5–5.1)
Sodium: 137 mEq/L (ref 135–145)
Total Bilirubin: 1 mg/dL (ref 0.2–1.2)
Total Protein: 7.3 g/dL (ref 6.0–8.3)

## 2019-11-06 LAB — LIPID PANEL
Cholesterol: 113 mg/dL (ref 0–200)
HDL: 46.7 mg/dL (ref >39.00)
LDL Cholesterol: 48 mg/dL (ref 0–99)
NonHDL: 66.66
Total CHOL/HDL Ratio: 2
Triglycerides: 91 mg/dL (ref 0.0–149.0)
VLDL: 18.2 mg/dL (ref 0.0–40.0)

## 2019-11-07 LAB — HEPATITIS B DNA, ULTRAQUANTITATIVE, PCR
Hepatitis B DNA (Calc): 2.93 Log IU/mL — ABNORMAL HIGH
Hepatitis B DNA: 847 IU/mL — ABNORMAL HIGH

## 2019-11-26 ENCOUNTER — Other Ambulatory Visit: Payer: Self-pay | Admitting: Family Medicine

## 2019-12-04 ENCOUNTER — Other Ambulatory Visit: Payer: Self-pay

## 2019-12-04 ENCOUNTER — Ambulatory Visit (INDEPENDENT_AMBULATORY_CARE_PROVIDER_SITE_OTHER): Payer: Medicare HMO

## 2019-12-04 ENCOUNTER — Encounter: Payer: Self-pay | Admitting: Family Medicine

## 2019-12-04 ENCOUNTER — Ambulatory Visit: Payer: Medicare HMO | Admitting: Family Medicine

## 2019-12-04 VITALS — BP 148/82 | HR 74 | Ht 66.0 in | Wt 182.0 lb

## 2019-12-04 DIAGNOSIS — M545 Low back pain, unspecified: Secondary | ICD-10-CM

## 2019-12-04 DIAGNOSIS — G8929 Other chronic pain: Secondary | ICD-10-CM

## 2019-12-04 IMAGING — DX DG LUMBAR SPINE COMPLETE 4+V
5 series · 5 of 5 positions shown · non-contrast
Comparison: None

CLINICAL DATA: Chronic lower back pain radiating down RIGHT leg

EXAM:
LUMBAR SPINE - COMPLETE 4+ VIEW

[lumbar spine ap]
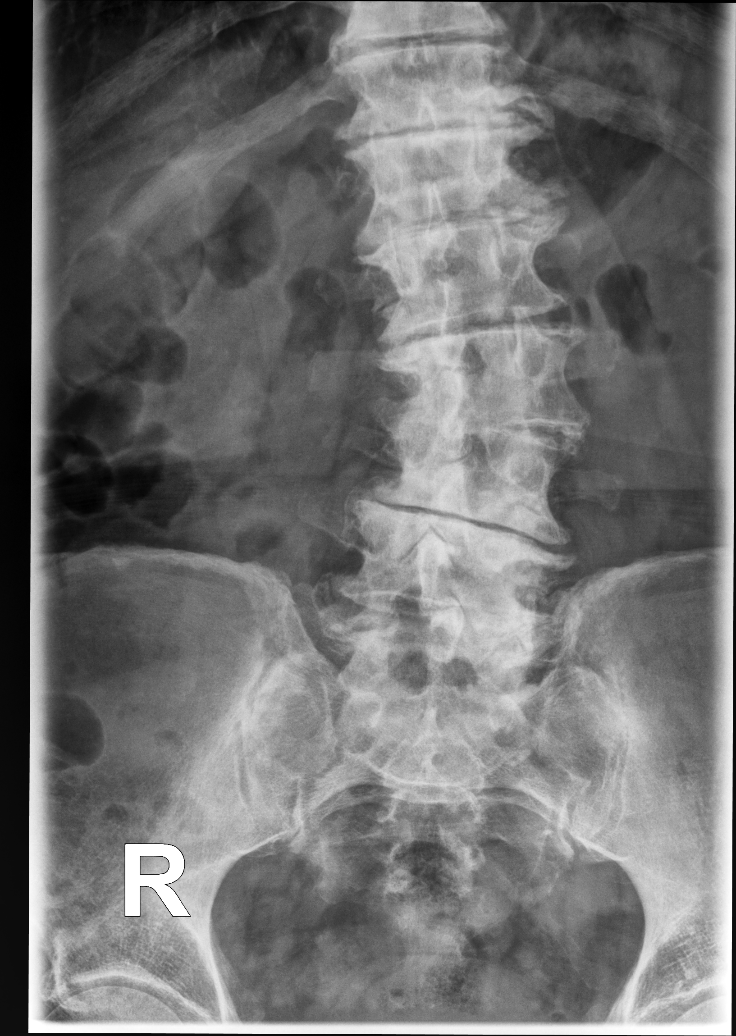

[lumbar spine mlo (1 of 2)]
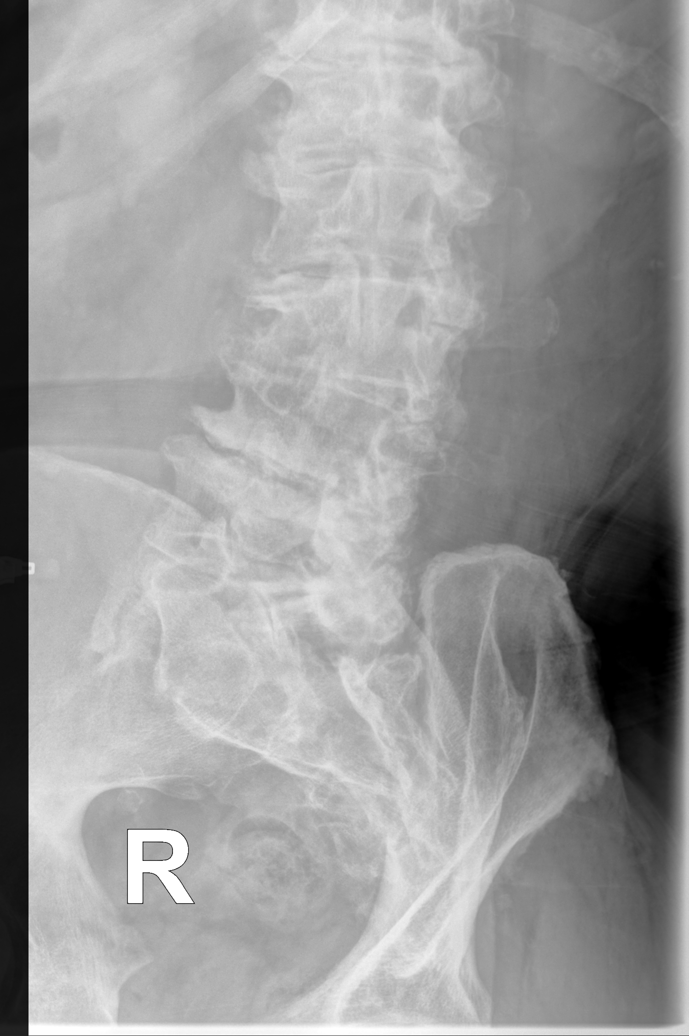

[lumbar spine mlo (2 of 2)]
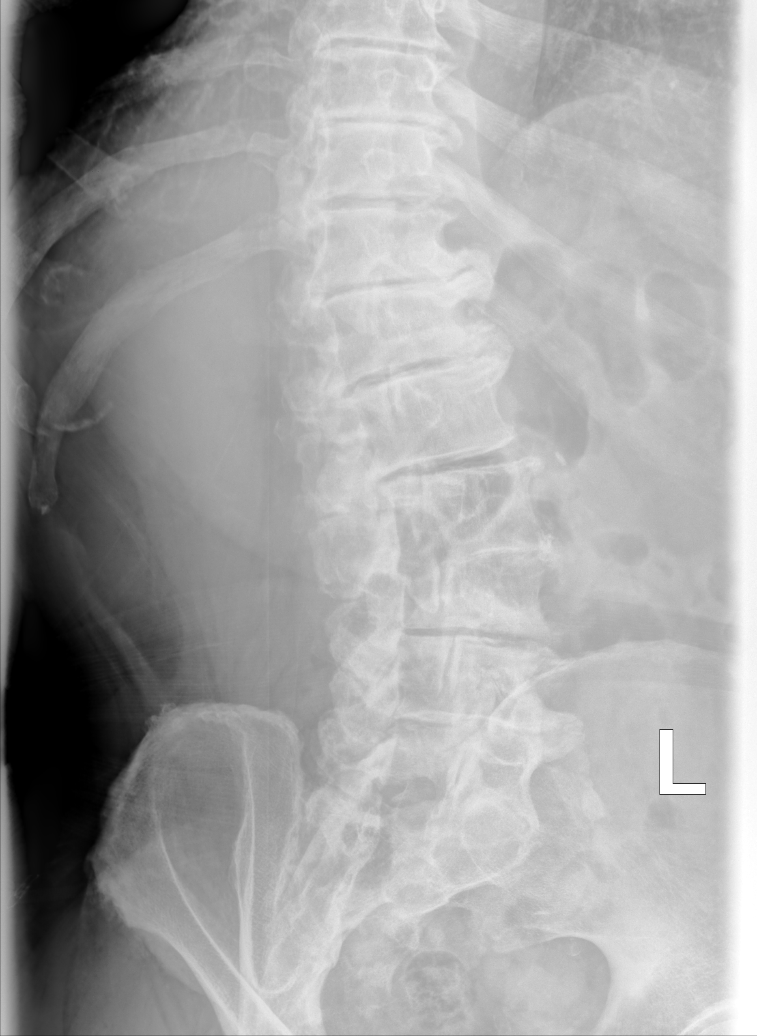

[lumbar spine lat (1 of 2)]
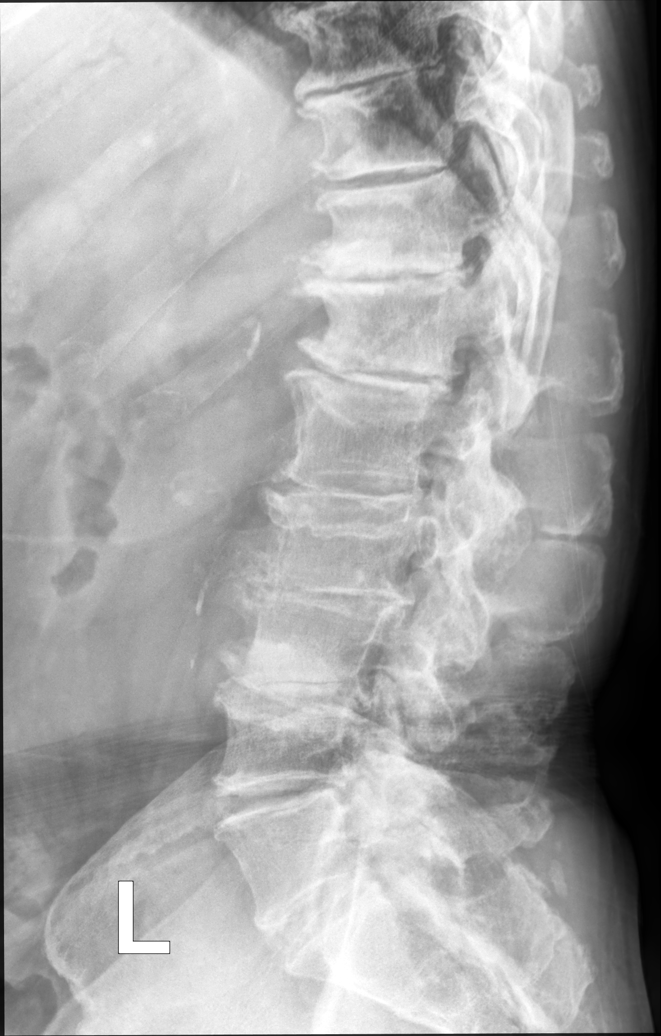

[lumbar spine lat (2 of 2)]
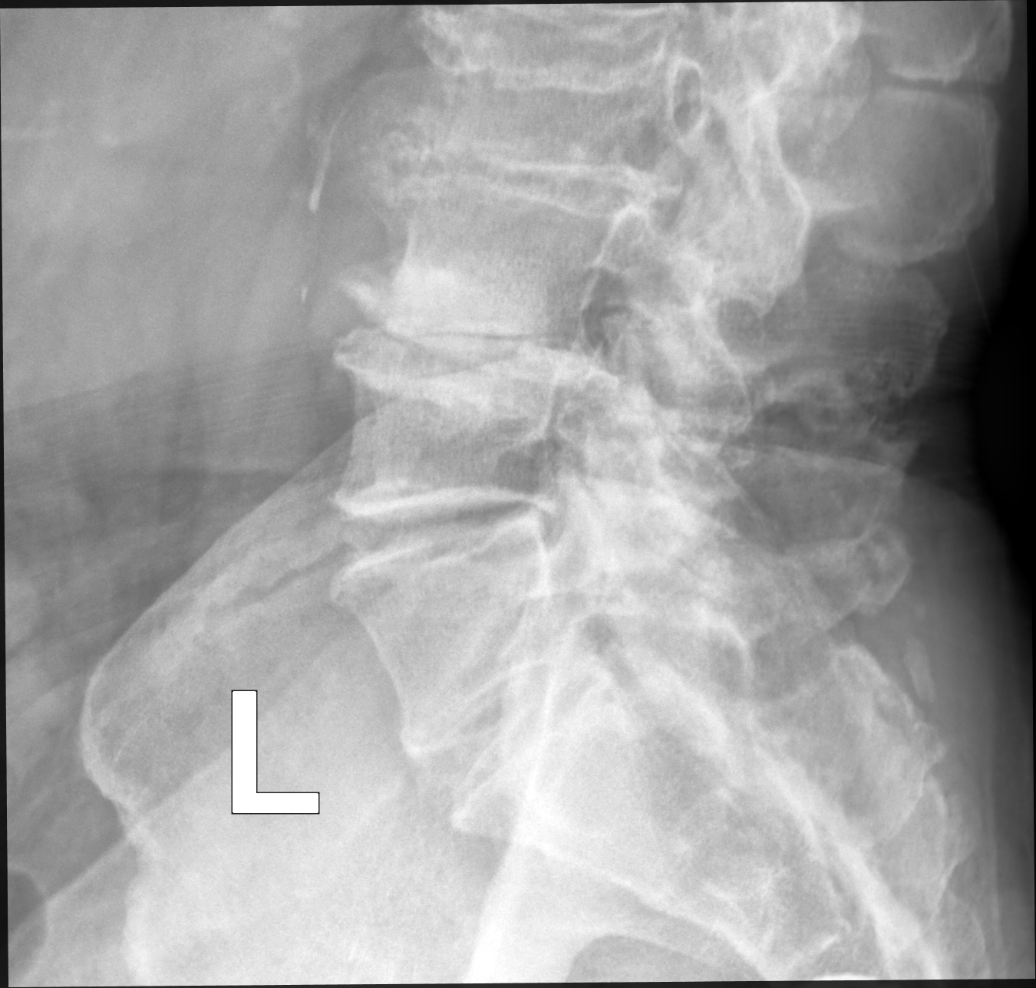

[5 of 5 positions shown; findings below may reference images not displayed]

FINDINGS: Six non-rib-bearing lumbar type vertebra.

Multilevel disc space narrowing and endplate spur formation
associated with endplate sclerosis throughout lumbar spine and lower
thoracic spine.

Levoconvex lumbar scoliosis.

Multilevel facet degenerative changes greater inferiorly.

Minimal retrolisthesis at multiple levels.

No fracture or bone destruction.

No spondylolysis.

SI joints demonstrate mild sclerosis superiorly.
IMPRESSION: Multilevel advanced degenerative changes of the lumbar spine with
associated levoconvex scoliosis as above.

## 2019-12-04 MED ORDER — GABAPENTIN 100 MG PO CAPS: 200.0000 mg | ORAL_CAPSULE | Freq: Every day | ORAL | 3 refills | Status: DC

## 2019-12-04 NOTE — Progress Notes (Signed)
[facility] [address] [place] Phone: [phone] Subjective:   I Anthony Mann am serving as a Education administrator for Dr. Hulan Saas.  This visit occurred during the SARS-CoV-2 public health emergency.  Safety protocols were in place, including screening questions prior to the visit, additional usage of staff PPE, and extensive cleaning of exam room while observing appropriate contact time as indicated for disinfecting solutions.   I'm seeing this patient by the request  of:  Marin Olp, MD  CC: Low back pain  [id number]  Anthony Mann is a 74 y.o. male coming in with complaint of hamstring and back pain. Patient has right sided groin pain that radiates down the leg. Lower back pain. 10 years ago he lifted something heavy and thought he pulled his groin. Not sure if this is related.   Onset- Chronic  Location - right groin and lower back  Duration- pain is the same all day Character-  Aggravating factors- walking, flexion  Reliving factors-  Therapies tried- ice, heat at night  Severity- 3/10 at its worse groin 5/10 for the back      Past Medical History:  Diagnosis Date  . Cataract    right eye in [place], left here 10/2013  . History of hepatitis B   . Hyperlipidemia   . Hypertension   . Prostate cancer ([place]) 2018   Past Surgical History:  Procedure Laterality Date  . arm trauma     he had an industrial accident in 1991   . COLONOSCOPY  03/24/2016  . EYE SURGERY     right eye cataract  in [place]  last July 2020   negative results per pt  . SKIN GRAFT FULL THICKNESS ARM     right arm  . ULNAR NERVE TRANSPOSITION     in 1991   Social History   Socioeconomic History  . Marital status: Married    Spouse name: donna  . Number of children: 2  . Years of education: Not on file  . Highest education level: Not on file  Occupational History  . Occupation: retired    Comment: Control and instrumentation engineer  Tobacco Use  .  Smoking status: Former Smoker    Packs/day: 0.50    Years: 9.00    Pack years: 4.50    Types: Cigarettes    Quit date: 06/13/1989    Years since quitting: 30.4  . Smokeless tobacco: Never Used  Substance and Sexual Activity  . Alcohol use: Yes    Alcohol/week: 3.0 standard drinks    Types: 2 Cans of beer, 1 Shots of liquor per week    Comment: occasional beer  . Drug use: No  . Sexual activity: Never    Birth control/protection: None  Other Topics Concern  . Not on file  Social History Narrative   Moved to [facility] in 2011 from [place] due to more affordable.       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       Married 1972 [place] 2 kids. 5 grandkids. 2 in [place], 3 [place]. Son coaches at [employer] for soccer team and is a Writer from there.       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  Difficulty of Paying Living Expenses:   Food Insecurity:   . Worried About Charity fundraiser in the Last Year:   . Arboriculturist in the Last Year:   Transportation Needs:   . Film/video editor (Medical):   Marland Kitchen Lack of Transportation (Non-Medical):   Physical Activity:   . Days of Exercise per Week:   . Minutes of Exercise per Session:   Stress:   . Feeling of Stress :   Social Connections:   . Frequency of Communication with Friends and Family:   . Frequency of Social Gatherings with Friends and Family:   . Attends Religious Services:   . Active Member of Clubs or Organizations:   . Attends Archivist Meetings:   Marland Kitchen Marital Status:    No Known Allergies Family History  Problem Relation Age of Onset  . Cancer Father        colon  . Heart disease Father 24  . Kidney disease Father        nephectomy  . Colon cancer Father 75  . Diabetes Mother 63  . Colon polyps Neg  Hx   . Esophageal cancer Neg Hx   . Rectal cancer Neg Hx   . Stomach cancer Neg Hx      Current Outpatient Medications (Cardiovascular):  .  amLODipine (NORVASC) 2.5 MG tablet, TAKE 1 TABLET BY MOUTH EVERY DAY .  hydrochlorothiazide (HYDRODIURIL) 25 MG tablet, TAKE 1 TABLET BY MOUTH EVERY DAY .  irbesartan (AVAPRO) 300 MG tablet, TAKE 1 TABLET BY MOUTH EVERY DAY .  rosuvastatin (CRESTOR) 10 MG tablet, TAKE 1 TABLET BY MOUTH EVERY DAY   Current Outpatient Medications (Analgesics):  .  aspirin EC 81 MG tablet, Take 81 mg by mouth daily.   Current Outpatient Medications (Other):  .  gabapentin (NEURONTIN) 100 MG capsule, Take 2 capsules (200 mg total) by mouth at bedtime.   Reviewed prior external information including notes and imaging from  primary care provider As well as notes that were available from care everywhere and other healthcare systems.  Past medical history, social, surgical and family history all reviewed in electronic medical record.  No pertanent information unless stated regarding to the chief complaint.   Review of Systems:  No headache, visual changes, nausea, vomiting, diarrhea, constipation, dizziness, abdominal pain, skin rash, fevers, chills, night sweats, weight loss, swollen lymph nodes, body aches, joint swelling, chest pain, shortness of breath, mood changes. POSITIVE muscle aches  Objective  Blood pressure (!) 148/82, pulse 74, height 5\' 6"  (1.676 m), weight 182 lb (82.6 kg), SpO2 98 %.   General: No apparent distress alert and oriented x3 mood and affect normal, dressed appropriately.  HEENT: Pupils equal, extraocular movements intact  Respiratory: Patient's speak in full sentences and does not appear short of breath  Cardiovascular: No lower extremity edema, non tender, no erythema  patient is significantly stiff giving patient a almost wide-based gait MSK: Moderate to severe arthritic changes. Back exam has significant loss of lordosis.  Patient  does have poor core strength noted.  Significant tightness of the hips with minimal external range of motion and minimal internal range of motion of the hips.  Patient has significant stiffness that would not allow him to even do FABER test.  Deep tendon reflexes appear to be intact  [id number]; 15 additional minutes spent for Therapeutic exercises as stated in above notes.  This included exercises focusing on stretching, strengthening, with significant focus on eccentric aspects.  Long term goals include an improvement in range of motion, strength, endurance as well as avoiding reinjury. Patient's frequency would include in 1-2 times a day, 3-5 times a week for a duration of 6-12 weeks. Low back exercises that included:  Pelvic tilt/bracing instruction to focus on control of the pelvic girdle and lower abdominal muscles  Glute strengthening exercises, focusing on proper firing of the glutes without engaging the low back muscles Proper stretching techniques for maximum relief for the hamstrings, hip flexors, low back and some rotation where tolerated   Proper technique shown and discussed handout in great detail with ATC.  All questions were discussed and answered.     Impression and Recommendations:     This case required medical decision making of moderate complexity. The above documentation has been reviewed and is accurate and complete Lyndal Pulley, DO       Note: This dictation was prepared with Dragon dictation along with smaller phrase technology. Any transcriptional errors that result from this process are unintentional.

## 2019-12-04 NOTE — Assessment & Plan Note (Signed)
Patient has significant loss of range of motion of the hips but seems to be more secondary to the external range of motion.  Patient also has significant loss of lordosis of the back and poor core strength.  Concern is for degenerative disc disease and potentially spinal stenosis.  X-rays ordered today.  We will also rule out any type of metastasis with patient having a history of prostate cancer.  We discussed home exercises and patient work with Product/process development scientist.  Prescription drug management includes gabapentin and warned of potential side effects.  Try these interventions and follow-up with me again in 4 weeks.  Future considerations could include the possibility of advanced imaging and formal physical therapy.

## 2019-12-04 NOTE — Patient Instructions (Addendum)
Good to see you Ice 20 minutes 2 times daily. Usually after activity and before bed. Exercises 3 times a week.  pennsaid pinkie amount topically 2 times daily as needed.  Gabapentin 200 mg at night  Tart cherry extract 1200mg  at night CQ10 200 with your statin  Do flat on treadmill Biking is better  See me again in 5-6 weeks

## 2019-12-10 ENCOUNTER — Encounter (HOSPITAL_COMMUNITY): Payer: Self-pay | Admitting: *Deleted

## 2019-12-10 ENCOUNTER — Emergency Department (HOSPITAL_COMMUNITY): Payer: Medicare HMO

## 2019-12-10 ENCOUNTER — Emergency Department (HOSPITAL_COMMUNITY)
Admission: EM | Admit: 2019-12-10 | Discharge: 2019-12-10 | Disposition: A | Discharge status: Home / Self Care | Payer: Medicare HMO | Attending: Emergency Medicine | Admitting: Emergency Medicine

## 2019-12-10 ENCOUNTER — Other Ambulatory Visit: Payer: Self-pay

## 2019-12-10 DIAGNOSIS — Z8546 Personal history of malignant neoplasm of prostate: Secondary | ICD-10-CM | POA: Diagnosis not present

## 2019-12-10 DIAGNOSIS — Z7982 Long term (current) use of aspirin: Secondary | ICD-10-CM | POA: Insufficient documentation

## 2019-12-10 DIAGNOSIS — R69 Illness, unspecified: Secondary | ICD-10-CM | POA: Diagnosis not present

## 2019-12-10 DIAGNOSIS — I10 Essential (primary) hypertension: Secondary | ICD-10-CM | POA: Insufficient documentation

## 2019-12-10 DIAGNOSIS — B182 Chronic viral hepatitis C: Secondary | ICD-10-CM | POA: Diagnosis not present

## 2019-12-10 DIAGNOSIS — R0789 Other chest pain: Secondary | ICD-10-CM | POA: Diagnosis not present

## 2019-12-10 DIAGNOSIS — R079 Chest pain, unspecified: Secondary | ICD-10-CM

## 2019-12-10 DIAGNOSIS — Z79899 Other long term (current) drug therapy: Secondary | ICD-10-CM | POA: Insufficient documentation

## 2019-12-10 DIAGNOSIS — Z87891 Personal history of nicotine dependence: Secondary | ICD-10-CM | POA: Insufficient documentation

## 2019-12-10 LAB — CBC
HCT: 48.7 % (ref 39.0–52.0)
Hemoglobin: 17 g/dL (ref 13.0–17.0)
MCH: 32.3 pg (ref 26.0–34.0)
MCHC: 34.9 g/dL (ref 30.0–36.0)
MCV: 92.4 fL (ref 80.0–100.0)
Platelets: 223 10*3/uL (ref 150–400)
RBC: 5.27 MIL/uL (ref 4.22–5.81)
RDW: 12.1 % (ref 11.5–15.5)
WBC: 7.2 10*3/uL (ref 4.0–10.5)
nRBC: 0 % (ref 0.0–0.2)

## 2019-12-10 LAB — BASIC METABOLIC PANEL
Anion gap: 12 (ref 5–15)
BUN: 13 mg/dL (ref 8–23)
CO2: 23 mmol/L (ref 22–32)
Calcium: 10.1 mg/dL (ref 8.9–10.3)
Chloride: 101 mmol/L (ref 98–111)
Creatinine, Ser: 1.09 mg/dL (ref 0.61–1.24)
GFR calc Af Amer: >60 mL/min (ref >60)
GFR calc non Af Amer: >60 mL/min (ref >60)
Glucose, Bld: 124 mg/dL — ABNORMAL HIGH (ref 70–99)
Potassium: 4.1 mmol/L (ref 3.5–5.1)
Sodium: 136 mmol/L (ref 135–145)

## 2019-12-10 LAB — TROPONIN I (HIGH SENSITIVITY)
Troponin I (High Sensitivity): 7 ng/L (ref <18)
Troponin I (High Sensitivity): 5 ng/L (ref <18)

## 2019-12-10 IMAGING — CT CT ANGIO CHEST
2 of 6 series · 18 of 46 positions shown · IV contrast (APPLIED)
Comparison: PA and lateral chest today.

CLINICAL DATA: Right chest pain which is worse with deep
inspiration for several days.

EXAM:
CT ANGIOGRAPHY CHEST WITH CONTRAST
TECHNIQUE: Multidetector CT imaging of the chest was performed using the
standard protocol during bolus administration of intravenous
contrast. Multiplanar CT image reconstructions and MIPs were
obtained to evaluate the vascular anatomy.
CONTRAST:  50 mL OMNIPAQUE IOHEXOL 350 MG/ML SOLN

[Series 7: thins · axial · 0.73mm/px · z∈[+1072,+1345]mm · 15 of 428 slices shown]
[im 19/428  lung]
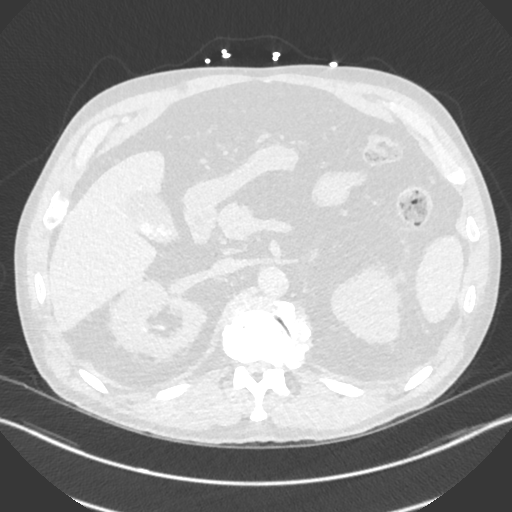
[im 56/428  soft-tissue]
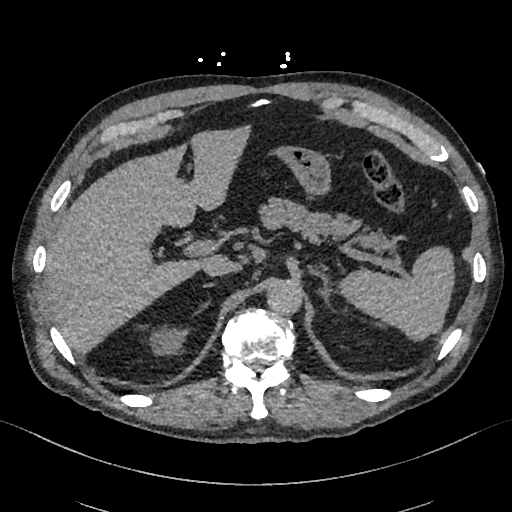
[im 75/428  lung]
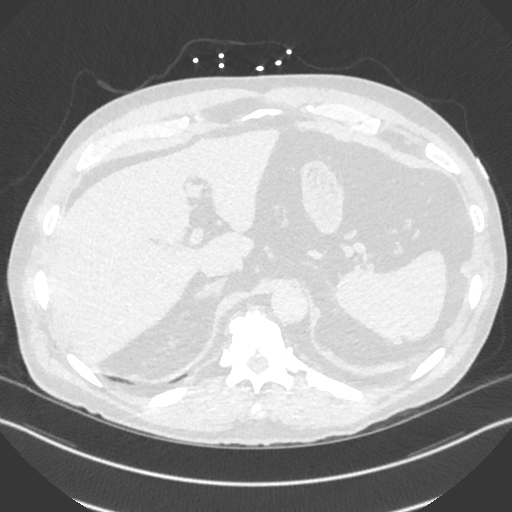
[im 112/428  soft-tissue]
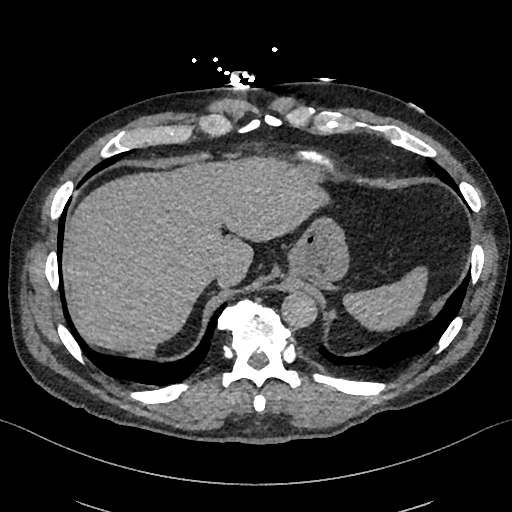
[im 130/428  lung]
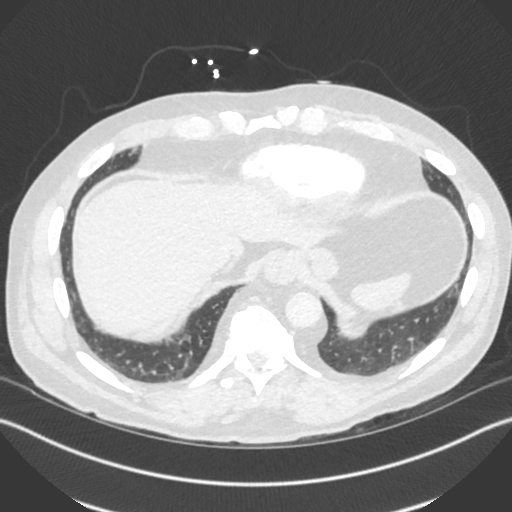
[im 168/428  soft-tissue]
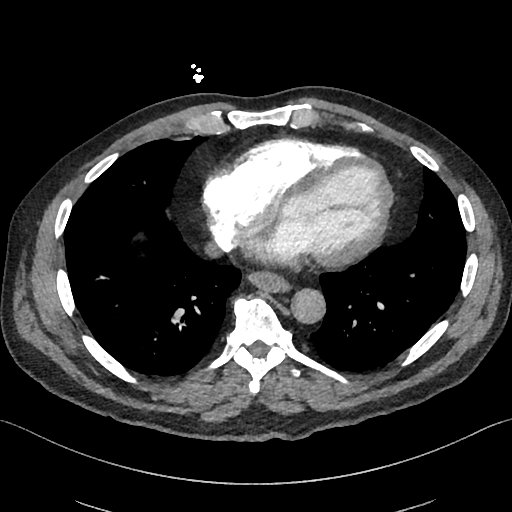
[im 186/428  lung]
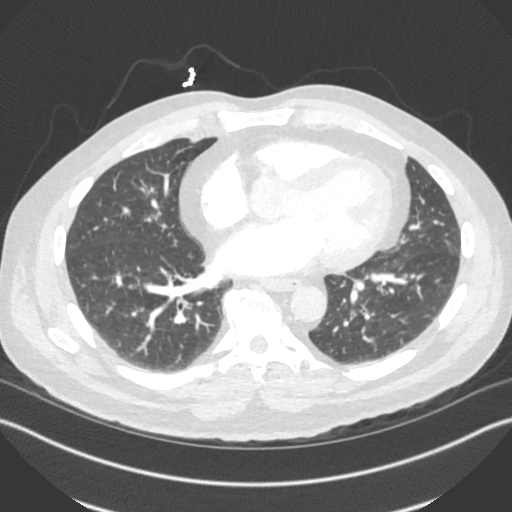
[im 223/428  soft-tissue]
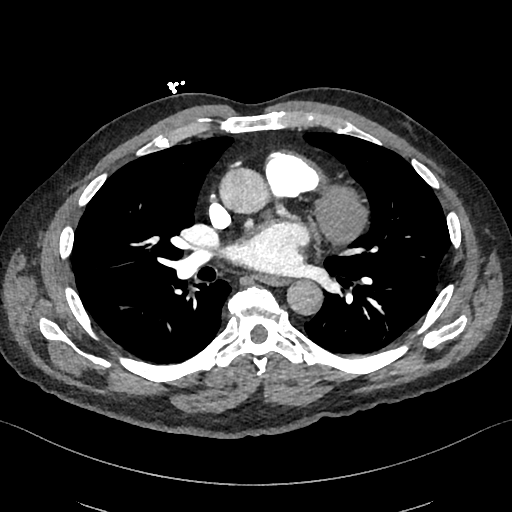
[im 242/428  lung]
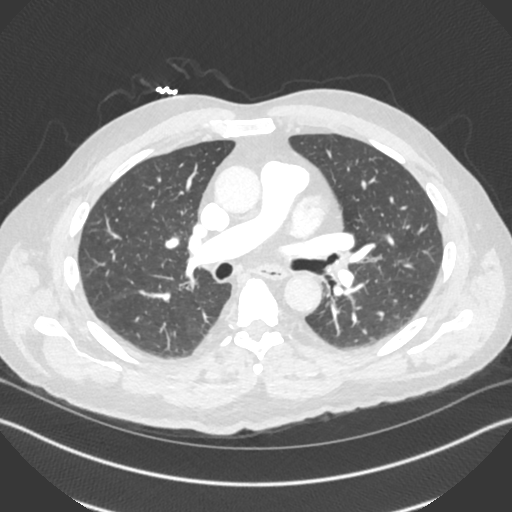
[im 260/428  soft-tissue]
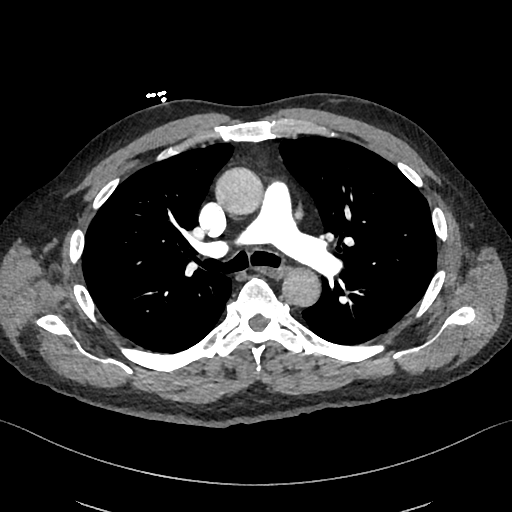
[im 298/428  lung]
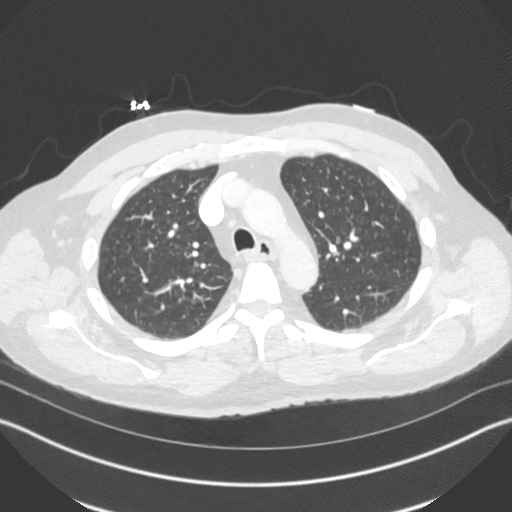
[im 316/428  soft-tissue]
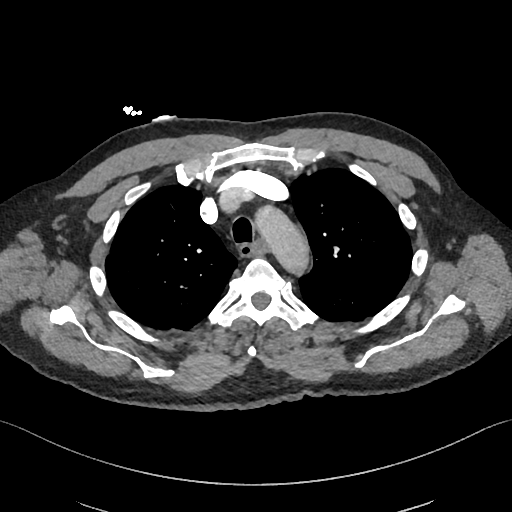
[im 353/428  lung]
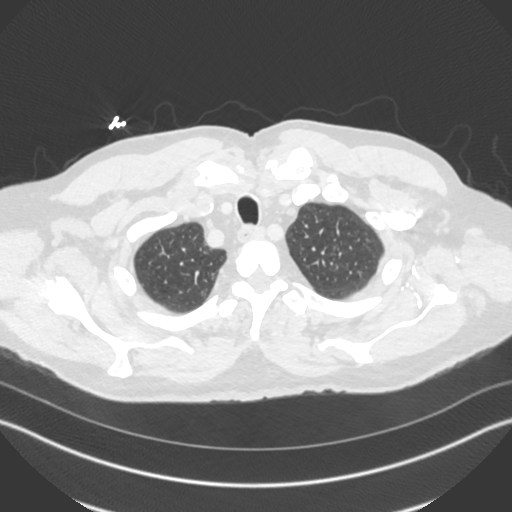
[im 372/428  soft-tissue]
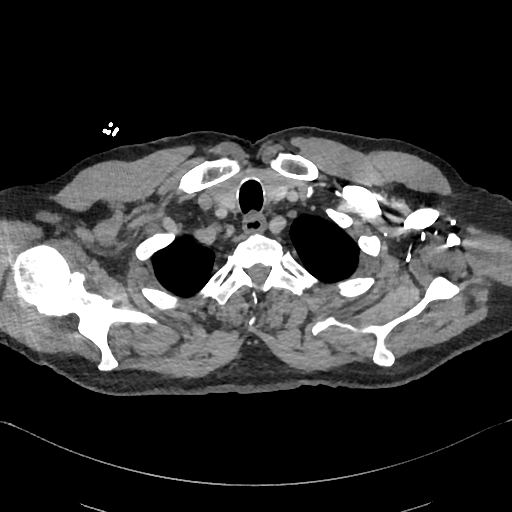
[im 409/428  lung]
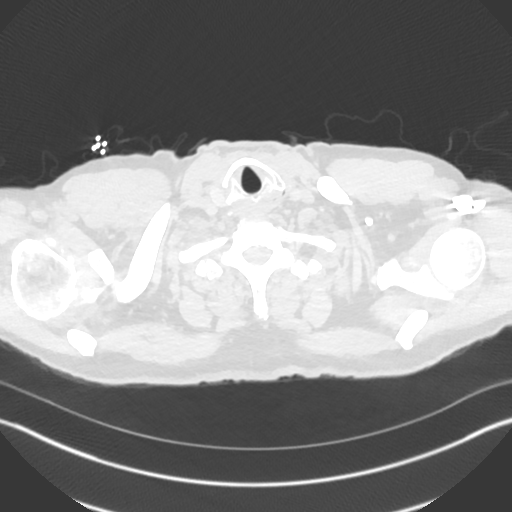

[Series 8: cor · coronal · 0.61mm/px · 3 of 118 slices shown]
[im 30/118  soft-tissue]
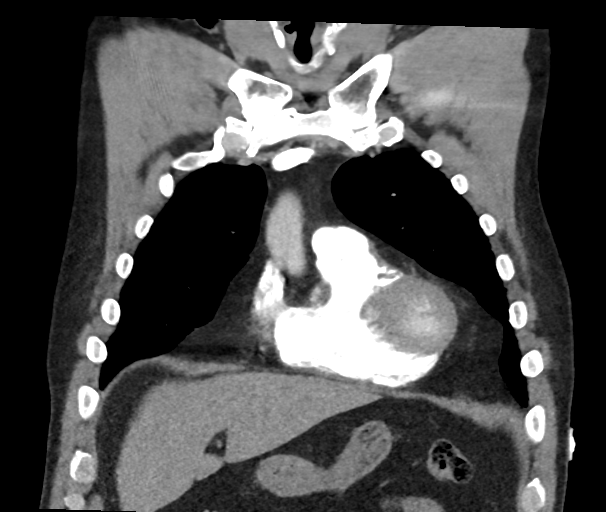
[im 59/118  soft-tissue]
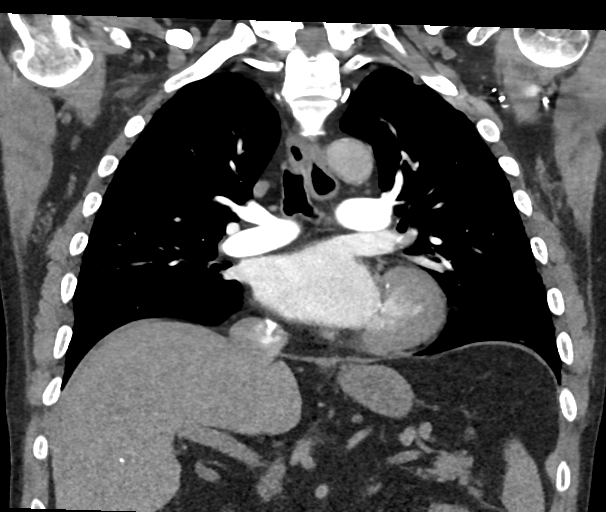
[im 88/118  soft-tissue]
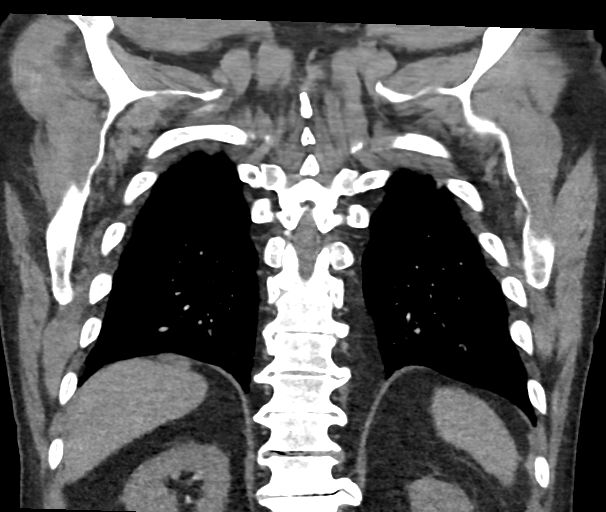

[18 of 46 positions shown; findings below may reference images not displayed]

FINDINGS: Cardiovascular: Satisfactory opacification of the pulmonary arteries
to the segmental level. No evidence of pulmonary embolism. Normal
heart size. No pericardial effusion. Scattered calcific and aortic
atherosclerotic calcifications noted.

Mediastinum/Nodes: No enlarged mediastinal, hilar, or axillary lymph
nodes. Thyroid gland, trachea, and esophagus demonstrate no
significant findings.

Lungs/Pleura: No pleural effusion. Lungs demonstrate only mild
dependent atelectatic change.

Upper Abdomen: Several small stones are seen in the gallbladder but
there is no CT evidence of cholecystitis. Punctate calcification in
the liver is noted. The patient has a small hiatal hernia.

Musculoskeletal: No acute or focal bony abnormality. Scattered
thoracic spondylosis is worst in the lower thoracic spine.

Review of the MIP images confirms the above findings.
IMPRESSION: Negative for pulmonary embolus or acute disease. No finding to
explain the patient's symptoms.

Gallstones.

Small hiatal hernia.

Aortic Atherosclerosis ([70]-[70]). Calcific coronary artery
disease also noted.

## 2019-12-10 IMAGING — DX DG CHEST 2V
2 series · 2 of 2 positions shown · non-contrast
Comparison: None.

CLINICAL DATA: Chest pain.  Hypertension.  Prostate cancer.

EXAM:
CHEST - 2 VIEW

[w chest pa]
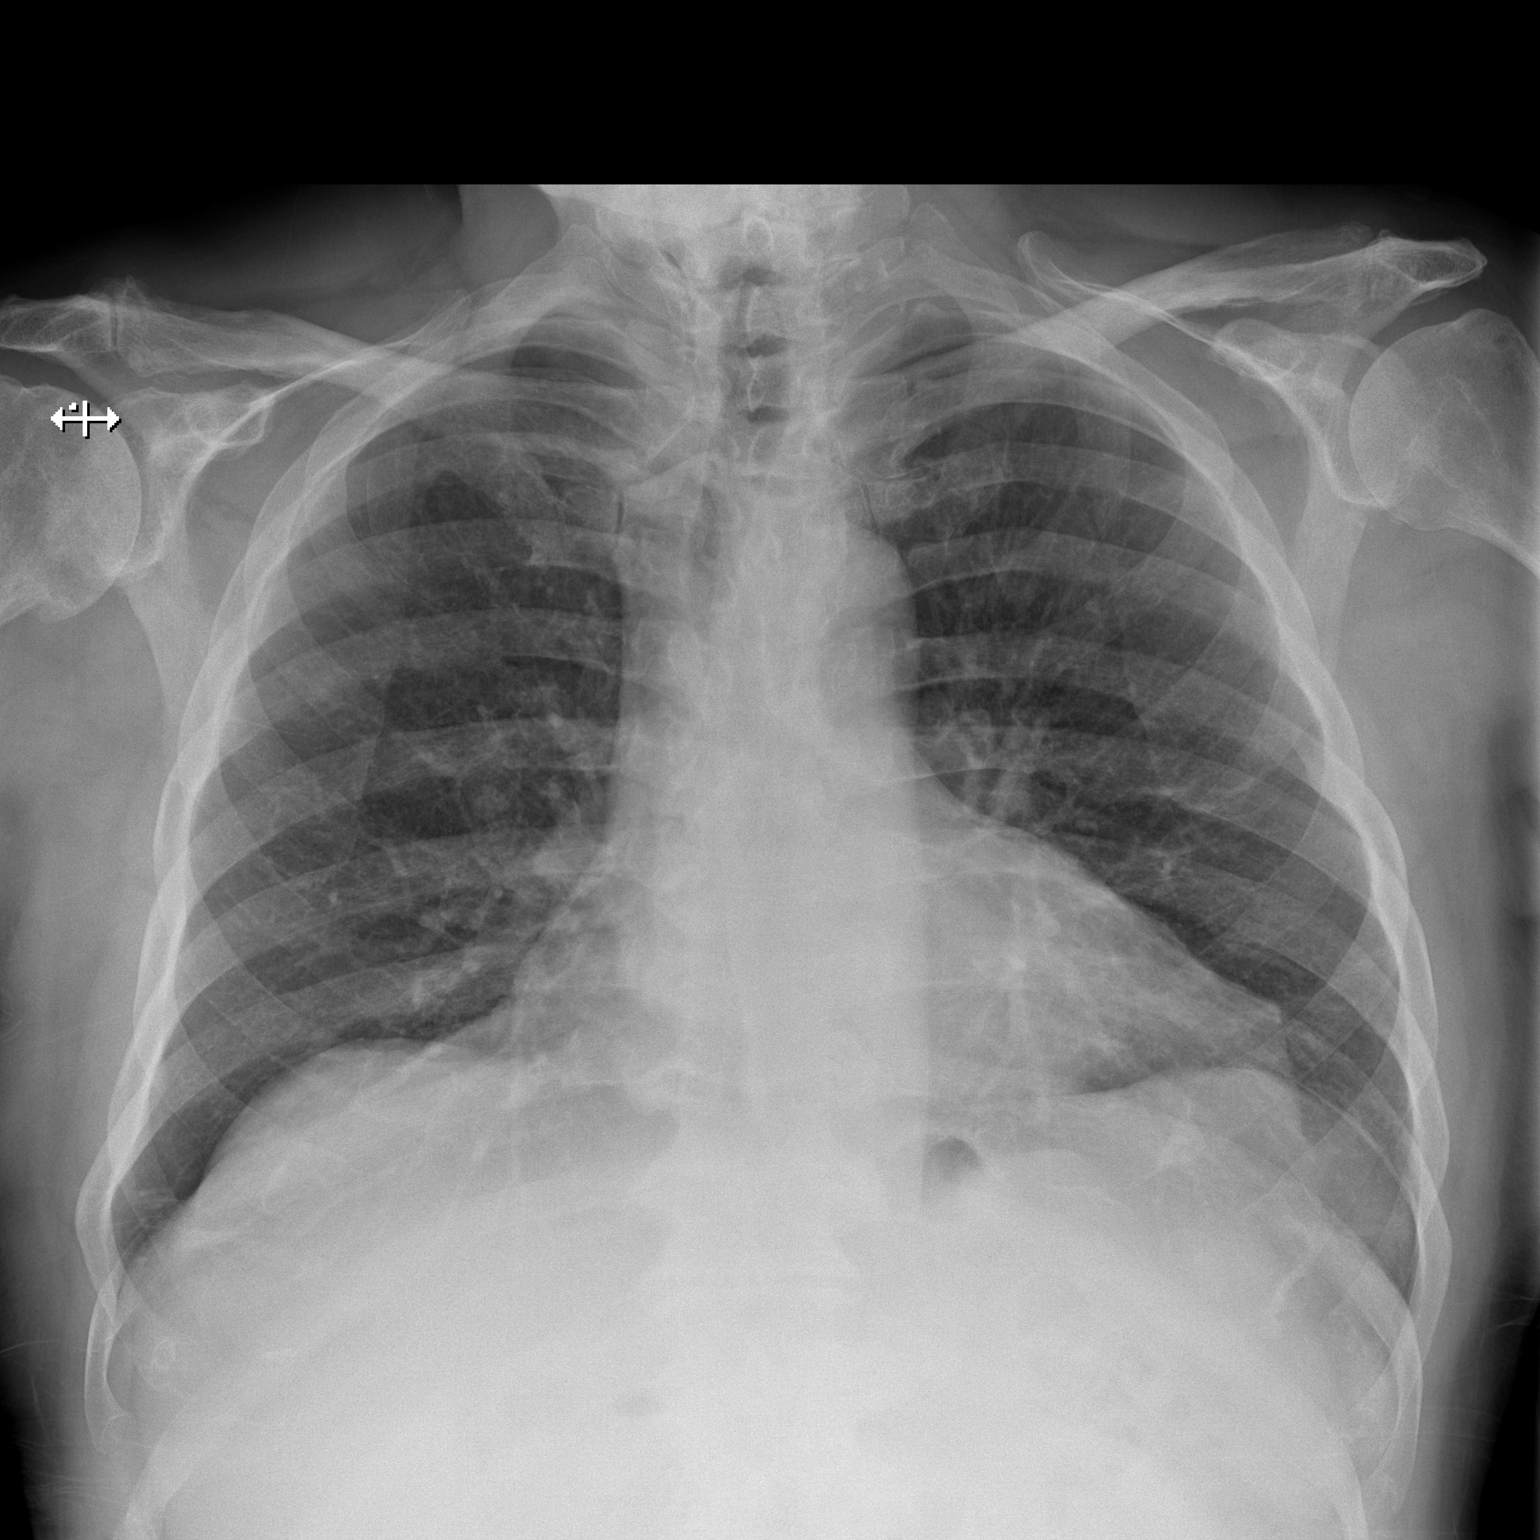

[w chest lat]
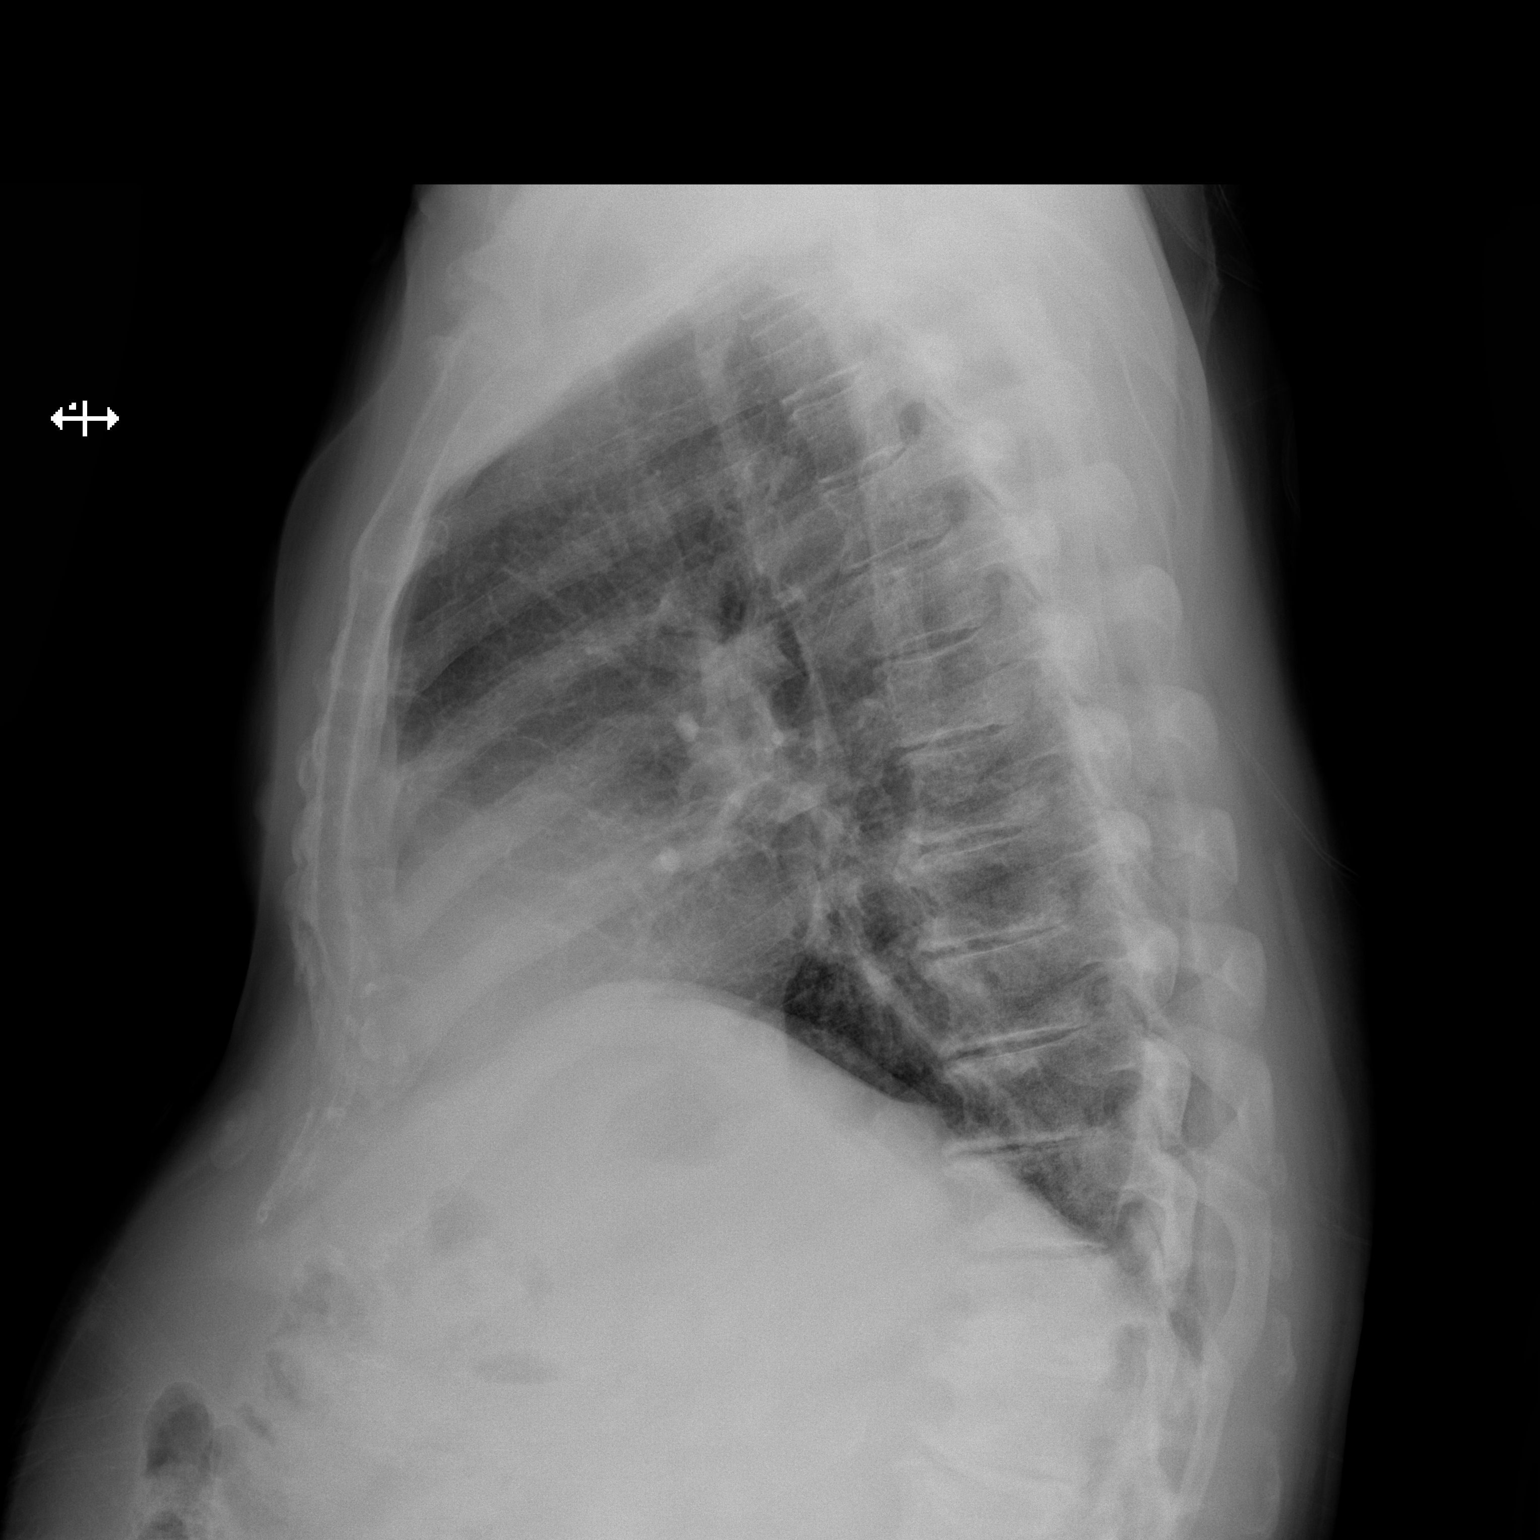

[2 of 2 positions shown; findings below may reference images not displayed]

FINDINGS: Moderate lower thoracic spondylosis. Midline trachea. Borderline
cardiomegaly. Mediastinal contours otherwise within normal limits.
No pleural effusion or pneumothorax. Clear lungs.
IMPRESSION: No acute cardiopulmonary disease.

## 2019-12-10 MED ORDER — IOHEXOL 350 MG/ML SOLN
50.0000 mL | Freq: Once | INTRAVENOUS | Status: AC | PRN
  Administered 2019-12-10: 50 mL via INTRAVENOUS

## 2019-12-10 MED ORDER — METHOCARBAMOL 500 MG PO TABS: 500.0000 mg | ORAL_TABLET | Freq: Two times a day (BID) | ORAL | 0 refills | Status: DC

## 2019-12-10 MED ORDER — SODIUM CHLORIDE 0.9% FLUSH: 3.0000 mL | Freq: Once | INTRAVENOUS | Status: DC

## 2019-12-10 MED ORDER — HYDROCODONE-ACETAMINOPHEN 5-325 MG PO TABS: 1.0000 | ORAL_TABLET | ORAL | 0 refills | Status: AC | PRN

## 2019-12-10 NOTE — Consult Note (Addendum)
Cardiology Consultation:   Patient ID: Anthony Mann; [id number]; 23-Oct-1945   Admit date: 12/10/2019 Date of Consult: 12/10/2019  Primary Care Provider: Marin Olp, MD Primary Cardiologist: No primary care provider on file.  New Primary Electrophysiologist:  None   Patient Profile:   Anthony Mann is a 74 y.o. male with a hx of HLD, HTN, prostate CA, hep B, who is being seen today for the evaluation of chest pain at the request of Dr Anthony Mann.  History of Present Illness:   Mr. Anthony Mann has no cardiac history.  He never gets chest pain with exertion.  He was exercising on the treadmill until they told him to stop because it was making his back worse.  He mows the yard on a regular basis.  He has no history of chest pain or shortness of breath with any of these activities.  Yesterday, he was in his usual state of health, when he had sudden onset of upper left back pain that radiated around to his upper left chest.  He can feel the symptoms when he is sitting still.  However, when he laid down and tried to turn onto his right side, the pain became excruciating.  It is a sharp pain, starts in his upper back just at the medial edge of his scapula and goes straight through to his upper left chest.  Both of these areas are tender to palpation.  He has never had anything like this before.  It scared him, because he thought he might be having a heart attack.  He came to the emergency room.  He and his wife were very relieved to learn that his troponins were negative.  In the ER, he has had a CT scan that did not show any critical abnormality.  He has never had a stress test or any other cardiac evaluation before.   Past Medical History:  Diagnosis Date  . Cataract    right eye in [place], left here 10/2013  . History of hepatitis B   . Hyperlipidemia   . Hypertension   . Prostate cancer ([place]) 2018    Past Surgical History:  Procedure Laterality Date  . arm trauma     he had an industrial  accident in 1991   . COLONOSCOPY  03/24/2016  . EYE SURGERY     right eye cataract  in [place]  last July 2020   negative results per pt  . SKIN GRAFT FULL THICKNESS ARM     right arm  . ULNAR NERVE TRANSPOSITION     in 1991     Prior to Admission medications   Medication Sig Start Date End Date Taking? Authorizing Provider  amLODipine (NORVASC) 2.5 MG tablet TAKE 1 TABLET BY MOUTH EVERY DAY Patient taking differently: Take 2.5 mg by mouth daily.  01/01/19  Yes Anthony Olp, MD  aspirin EC 81 MG tablet Take 81 mg by mouth daily.   Yes [provider]  gabapentin (NEURONTIN) 100 MG capsule Take 2 capsules (200 mg total) by mouth at bedtime. 12/04/19  Yes Hulan Saas M, DO  hydrochlorothiazide (HYDRODIURIL) 25 MG tablet TAKE 1 TABLET BY MOUTH EVERY DAY Patient taking differently: Take 25 mg by mouth daily.  11/26/19  Yes Anthony Olp, MD  irbesartan (AVAPRO) 300 MG tablet TAKE 1 TABLET BY MOUTH EVERY DAY Patient taking differently: Take 300 mg by mouth daily.  11/26/19  Yes Anthony Olp, MD  rosuvastatin (CRESTOR) 10 MG tablet  TAKE 1 TABLET BY MOUTH EVERY DAY Patient taking differently: Take 10 mg by mouth daily.  11/26/19  Yes Anthony Olp, MD    Inpatient Medications: Scheduled Meds: . sodium chloride flush  3 mL Intravenous Once   Continuous Infusions:  PRN Meds:   Allergies:   No Known Allergies  Social History:   Social History   Socioeconomic History  . Marital status: Married    Spouse name: Anthony Mann  . Number of children: 2  . Years of education: Not on file  . Highest education level: Not on file  Occupational History  . Occupation: retired    Comment: Control and instrumentation engineer  Tobacco Use  . Smoking status: Former Smoker    Packs/day: 0.50    Years: 9.00    Pack years: 4.50    Types: Cigarettes    Quit date: 06/13/1989    Years since quitting: 30.5  . Smokeless tobacco: Never Used  Substance and Sexual Activity  . Alcohol use: Yes     Alcohol/week: 3.0 standard drinks    Types: 2 Cans of beer, 1 Shots of liquor per week    Comment: occasional beer  . Drug use: No  . Sexual activity: Never    Birth control/protection: None  Other Topics Concern  . Not on file  Social History Narrative   Moved to [facility] in 2011 from [place] due to more affordable.       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       Married 1972 Anthony Mann). 2 kids. 5 grandkids. 2 in [place], 3 [place]. Son coaches at [employer] for soccer team and is a Writer from there.       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 Difficulty of Paying Living Expenses:   Food Insecurity:   . Worried About Charity fundraiser in the Last Year:   . Arboriculturist in the Last Year:   Transportation Needs:   . Film/video editor (Medical):   Marland Kitchen Lack of Transportation (Non-Medical):   Physical Activity:   . Days of Exercise per Week:   . Minutes of Exercise per Session:   Stress:   . Feeling of Stress :   Social Connections:   . Frequency of Communication with Friends and Family:   . Frequency of Social Gatherings with Friends and Family:   . Attends Religious Services:   . Active Member of Clubs or Organizations:   . Attends Archivist Meetings:   Marland Kitchen Marital Status:   Intimate Partner Violence:   . Fear of Current or Ex-Partner:   . Emotionally Abused:   Marland Kitchen Physically Abused:   . Sexually Abused:     Family History:   Family History  Problem Relation Age of Onset  . Cancer Father        colon  . Heart disease Father 57  . Kidney disease Father        nephectomy  . Colon cancer Father 76  . Diabetes Mother 58  . Colon polyps Neg Hx   . Esophageal cancer Neg Hx   . Rectal cancer Neg Hx   . Stomach cancer Neg Hx    Family Status:  Family  Status  Relation Name Status  .  Father  Deceased  . Mother  Deceased  . Neg Hx  (Not Specified)    ROS:  Please see the history of present illness.  All other ROS reviewed and negative.     Physical Exam/Data:   Vitals:   12/10/19 1215 12/10/19 1230 12/10/19 1245 12/10/19 1300  BP: (!) 145/70 (!) 152/66 127/69 126/60  Pulse: 68 71  66  Resp: 18 18 18 17   Temp:      TempSrc:      SpO2: 100% 99%  98%  Weight:      Height:       No intake or output data in the 24 hours ending 12/10/19 1643  Last 3 Weights 12/10/2019 12/04/2019 11/05/2019  Weight (lbs) 182 lb 182 lb 183 lb 3.2 oz  Weight (kg) 82.555 kg 82.555 kg 83.099 kg     Body mass index is 30.29 kg/m.   General:  Well nourished, well developed, male in no acute distress HEENT: normal Lymph: no adenopathy Neck: JVD -not elevated Endocrine:  No thryomegaly Vascular: No carotid bruits; 4/4 extremity pulses 2+  Cardiac:  normal S1, S2; RRR; no murmur, no rub Lungs:  clear bilaterally, no wheezing, rhonchi or rales, chest wall tenderness in the mid left chest above the left breast. Abd: soft, nontender, no hepatomegaly  Ext: no edema Musculoskeletal:  No deformities, BUE and BLE strength normal and equal; significant tenderness in his upper back just to the medial edge of the scapula.  During one of his episodes of pain, I was not able to palpate any spasming muscles Skin: warm and dry, no lesions noted Neuro:  CNs 2-12 intact, no focal abnormalities noted Psych:  Normal affect   EKG:  The EKG was personally reviewed and demonstrates: Sinus rhythm, left anterior fascicular block, no acute ischemic changes, normal intervals, late R wave transition R wave transition is new from 2013 Telemetry: Not on  CV studies:   None   Laboratory Data:   Chemistry Recent Labs  Lab 12/10/19 1011  NA 136  K 4.1  CL 101  CO2 23  GLUCOSE 124*  BUN 13  CREATININE 1.09  CALCIUM 10.1  GFRNONAA >60  GFRAA >60  ANIONGAP 12      Lab Results  Component Value Date   ALT 23 11/05/2019   AST 20 11/05/2019   ALKPHOS 36 (L) 11/05/2019   BILITOT 1.0 11/05/2019   Hematology Recent Labs  Lab 12/10/19 1011  WBC 7.2  RBC 5.27  HGB 17.0  HCT 48.7  MCV 92.4  MCH 32.3  MCHC 34.9  RDW 12.1  PLT 223   Cardiac Enzymes High Sensitivity Troponin:   Recent Labs  Lab 12/10/19 1011 12/10/19 1149  TROPONINIHS 7 5      BNPNo results for input(s): BNP, PROBNP in the last 168 hours.  DDimer No results for input(s): DDIMER in the last 168 hours. TSH:  Lab Results  Component Value Date   TSH 1.08 02/02/2016   Lipids: Lab Results  Component Value Date   CHOL 113 11/05/2019   HDL 46.70 11/05/2019   LDLCALC 48 11/05/2019   LDLDIRECT 42.0 04/19/2019   TRIG 91.0 11/05/2019   CHOLHDL 2 11/05/2019   HgbA1c:No results found for: HGBA1C Magnesium: No results found for: MG   Radiology/Studies:  DG Chest 2 View  Result Date: 12/10/2019 CLINICAL DATA:  Chest pain.  Hypertension.  Prostate cancer. EXAM: CHEST - 2 VIEW COMPARISON:  None. FINDINGS: Moderate lower thoracic spondylosis. Midline trachea. Borderline  cardiomegaly. Mediastinal contours otherwise within normal limits. No pleural effusion or pneumothorax. Clear lungs. IMPRESSION: No acute cardiopulmonary disease. Electronically Signed   By: Abigail Miyamoto M.D.   On: 12/10/2019 10:24   CT Angio Chest PE W and/or Wo Contrast  Result Date: 12/10/2019 CLINICAL DATA:  Right chest pain which is worse with deep inspiration for several days. EXAM: CT ANGIOGRAPHY CHEST WITH CONTRAST TECHNIQUE: Multidetector CT imaging of the chest was performed using the standard protocol during bolus administration of intravenous contrast. Multiplanar CT image reconstructions and MIPs were obtained to evaluate the vascular anatomy. CONTRAST:  50 mL OMNIPAQUE IOHEXOL 350 MG/ML SOLN COMPARISON:  PA and lateral chest today. FINDINGS: Cardiovascular: Satisfactory opacification of the pulmonary  arteries to the segmental level. No evidence of pulmonary embolism. Normal heart size. No pericardial effusion. Scattered calcific and aortic atherosclerotic calcifications noted. Mediastinum/Nodes: No enlarged mediastinal, hilar, or axillary lymph nodes. Thyroid gland, trachea, and esophagus demonstrate no significant findings. Lungs/Pleura: No pleural effusion. Lungs demonstrate only mild dependent atelectatic change. Upper Abdomen: Several small stones are seen in the gallbladder but there is no CT evidence of cholecystitis. Punctate calcification in the liver is noted. The patient has a small hiatal hernia. Musculoskeletal: No acute or focal bony abnormality. Scattered thoracic spondylosis is worst in the lower thoracic spine. Review of the MIP images confirms the above findings. IMPRESSION: Negative for pulmonary embolus or acute disease. No finding to explain the patient's symptoms. Gallstones. Small hiatal hernia. Aortic Atherosclerosis (ICD10-I70.0). Calcific coronary artery disease also noted. Electronically Signed   By: Inge Rise M.D.   On: 12/10/2019 14:09    Assessment and Plan:   1.  Atypical chest pain: -He has no history of exertional symptoms. -His cardiac enzymes are negative despite having pain since yesterday -Although his ECG looks different from his 2013 ECG, this is of unclear significance -MD to review and decide if any further cardiac work-up is indicated -It is also possible that this is early shingles  Principal Problem:   Atypical chest pain     For questions or updates, please contact [place] Please consult [email or web address] for contact info under Cardiology/STEMI.   Jonetta Speak, PA-C  12/10/2019 4:43 PM  Attending Note:   The patient was seen and examined.  Agree with assessment and plan as noted above.  Changes made to the above note as needed.  Patient seen and independently examined with Rosaria Ferries, PA.   We discussed all aspects of  the encounter. I agree with the assessment and plan as stated above.  1.  Atypical chest pain: The patient describes sudden episode of back pain that occurred while he was just walking.  He normally is very active and exercises regularly several times a week.  He is never had any episodes of chest pain or back pain.  The pain was quite severe and caused him to sink slowly to the floor.  There was some radiation around to the front.  It hurt him to take a deep breath but he does not recall that he was short of breath.  The pain lasted for greater than 24 hours.  It was slightly better during the night but this morning increased in intensity.  His wife brought him to the emergency room.  2 troponin levels were negative despite having 24 hours of constant chest pain.  Chest CT angiogram was negative for pulmonary embolus.  There was also no mention of any aortic dissection.  The patient seems to be  feeling a little bit better. EKG shows no acute ST or T wave changes.  There is poor R wave progression but I suspect this is due to lead placement differences in EKGs.  At this time I do not think there is any additional cardiac work-up at is required.  I am happy to see him in the office for further evaluation if Dr. Yong Channel would like.  We will go home with some pain medicines and muscle relaxers I will see him in the office if neede  I encouraged him to continue with a good diet, exercise program.   I have spent a total of 40 minutes with patient reviewing hospital  notes , telemetry, EKGs, labs and examining patient as well as establishing an assessment and plan that was discussed with the patient. > 50% of time was spent in direct patient care.    Thayer Headings, Brooke Bonito., MD, [facility] 12/10/2019, 5:12 PM [id number] N. [address]  [place] Pager [phone]

## 2019-12-10 NOTE — ED Provider Notes (Signed)
[facility] EMERGENCY DEPARTMENT Provider Note   CSN: [id number] Arrival date & time: 12/10/19  [id number]     History Chief Complaint  Patient presents with  . Chest Pain    Anthony Mann is a 74 y.o. male.  The history is provided by the patient and the spouse. No language interpreter was used.  Chest Pain Pain location:  L chest and L lateral chest Pain quality: crushing and tightness   Pain radiates to:  Does not radiate Pain severity:  Moderate Onset quality:  Gradual Duration:  1 day Timing:  Constant Progression:  Waxing and waning Context: breathing and movement   Relieved by:  Nothing Worsened by:  Movement Ineffective treatments:  None tried Associated symptoms: no abdominal pain   Risk factors: hypertension   Risk factors: no coronary artery disease   Pt reports sudden onset of severe left sided chest pain last pm.  Pt reports he tried lying on the floor with some relief.  Pt states pain went from left chest through to his back.  Pt reports pain has continued today.  Pt reports he currently has pain with moving.  Pain in left chest and back with movement      Past Medical History:  Diagnosis Date  . Cataract    right eye in [place], left here 10/2013  . History of hepatitis B   . Hyperlipidemia   . Hypertension   . Prostate cancer [facility]) 2018    Patient Active Problem List   Diagnosis Date Noted  . Low back pain 12/04/2019  . History of adenomatous polyp of colon 05/11/2019  . Prostate cancer ([place]) 08/03/2017  . Depressed mood 06/27/2017  . BPH associated with nocturia 01/26/2016  . Hyperlipidemia 07/16/2014  . Hepatitis B   . HTN (hypertension) 10/14/2010    Past Surgical History:  Procedure Laterality Date  . arm trauma     he had an industrial accident in 1991   . COLONOSCOPY  03/24/2016  . EYE SURGERY     right eye cataract  in [place]  last July 2020   negative results per pt  . SKIN GRAFT FULL THICKNESS ARM     right arm  . ULNAR NERVE TRANSPOSITION     in 1991       Family History  Problem Relation Age of Onset  . Cancer Father        colon  . Heart disease Father 22  . Kidney disease Father        nephectomy  . Colon cancer Father 76  . Diabetes Mother 86  . Colon polyps Neg Hx   . Esophageal cancer Neg Hx   . Rectal cancer Neg Hx   . Stomach cancer Neg Hx     Social History   Tobacco Use  . Smoking status: Former Smoker    Packs/day: 0.50    Years: 9.00    Pack years: 4.50    Types: Cigarettes    Quit date: 06/13/1989    Years since quitting: 30.5  . Smokeless tobacco: Never Used  Substance Use Topics  . Alcohol use: Yes    Alcohol/week: 3.0 standard drinks    Types: 2 Cans of beer, 1 Shots of liquor per week    Comment: occasional beer  . Drug use: No    Home Medications Prior to Admission medications   Medication Sig Start Date End Date Taking? Authorizing Provider  amLODipine (NORVASC) 2.5 MG tablet TAKE  1 TABLET BY MOUTH EVERY DAY Patient taking differently: Take 2.5 mg by mouth daily.  01/01/19  Yes Marin Olp, MD  aspirin EC 81 MG tablet Take 81 mg by mouth daily.   Yes [provider]  gabapentin (NEURONTIN) 100 MG capsule Take 2 capsules (200 mg total) by mouth at bedtime. 12/04/19  Yes Hulan Saas M, DO  hydrochlorothiazide (HYDRODIURIL) 25 MG tablet TAKE 1 TABLET BY MOUTH EVERY DAY Patient taking differently: Take 25 mg by mouth daily.  11/26/19  Yes Marin Olp, MD  irbesartan (AVAPRO) 300 MG tablet TAKE 1 TABLET BY MOUTH EVERY DAY Patient taking differently: Take 300 mg by mouth daily.  11/26/19  Yes Marin Olp, MD  rosuvastatin (CRESTOR) 10 MG tablet TAKE 1 TABLET BY MOUTH EVERY DAY Patient taking differently: Take 10 mg by mouth daily.  11/26/19  Yes Marin Olp, MD    Allergies    Patient has no known allergies.  Review of Systems   Review of Systems  Cardiovascular: Positive for chest pain.  Gastrointestinal:  Negative for abdominal pain.  All other systems reviewed and are negative.   Physical Exam Updated Vital Signs BP 126/60   Pulse 66   Temp 97.8 F (36.6 C) (Oral)   Resp 17   Ht 5\' 5"  (1.651 m)   Wt 82.6 kg   SpO2 98%   BMI 30.29 kg/m   Physical Exam Vitals and nursing note reviewed.  Constitutional:      Appearance: He is well-developed.  HENT:     Head: Normocephalic and atraumatic.  Eyes:     Conjunctiva/sclera: Conjunctivae normal.  Cardiovascular:     Rate and Rhythm: Normal rate and regular rhythm.     Heart sounds: Normal heart sounds. No murmur.  Pulmonary:     Effort: Pulmonary effort is normal. No respiratory distress.     Breath sounds: Normal breath sounds.  Abdominal:     Palpations: Abdomen is soft.     Tenderness: There is no abdominal tenderness.  Musculoskeletal:        General: Normal range of motion.     Cervical back: Normal range of motion and neck supple.  Skin:    General: Skin is warm and dry.  Neurological:     General: No focal deficit present.     Mental Status: He is alert.  Psychiatric:        Mood and Affect: Mood normal.     ED Results / Procedures / Treatments   Labs (all labs ordered are listed, but only abnormal results are displayed) Labs Reviewed  BASIC METABOLIC PANEL - Abnormal; Notable for the following components:      Result Value   Glucose, Bld 124 (*)    All other components within normal limits  CBC  TROPONIN I (HIGH SENSITIVITY)  TROPONIN I (HIGH SENSITIVITY)    EKG EKG Interpretation  Date/Time:  Monday December 10 2019 09:56:27 EDT Ventricular Rate:  81 PR Interval:  148 QRS Duration: 98 QT Interval:  380 QTC Calculation: 441 R Axis:   -45 Text Interpretation: Normal sinus rhythm Left anterior fascicular block Abnormal ECG No old tracing to compare Confirmed by Dorie Rank [phone]) on 12/10/2019 12:14:22 PM   Radiology DG Chest 2 View  Result Date: 12/10/2019 CLINICAL DATA:  Chest pain.  Hypertension.   Prostate cancer. EXAM: CHEST - 2 VIEW COMPARISON:  None. FINDINGS: Moderate lower thoracic spondylosis. Midline trachea. Borderline cardiomegaly. Mediastinal contours otherwise within normal limits.  No pleural effusion or pneumothorax. Clear lungs. IMPRESSION: No acute cardiopulmonary disease. Electronically Signed   By: Abigail Miyamoto M.D.   On: 12/10/2019 10:24   CT Angio Chest PE W and/or Wo Contrast  Result Date: 12/10/2019 CLINICAL DATA:  Right chest pain which is worse with deep inspiration for several days. EXAM: CT ANGIOGRAPHY CHEST WITH CONTRAST TECHNIQUE: Multidetector CT imaging of the chest was performed using the standard protocol during bolus administration of intravenous contrast. Multiplanar CT image reconstructions and MIPs were obtained to evaluate the vascular anatomy. CONTRAST:  50 mL OMNIPAQUE IOHEXOL 350 MG/ML SOLN COMPARISON:  PA and lateral chest today. FINDINGS: Cardiovascular: Satisfactory opacification of the pulmonary arteries to the segmental level. No evidence of pulmonary embolism. Normal heart size. No pericardial effusion. Scattered calcific and aortic atherosclerotic calcifications noted. Mediastinum/Nodes: No enlarged mediastinal, hilar, or axillary lymph nodes. Thyroid gland, trachea, and esophagus demonstrate no significant findings. Lungs/Pleura: No pleural effusion. Lungs demonstrate only mild dependent atelectatic change. Upper Abdomen: Several small stones are seen in the gallbladder but there is no CT evidence of cholecystitis. Punctate calcification in the liver is noted. The patient has a small hiatal hernia. Musculoskeletal: No acute or focal bony abnormality. Scattered thoracic spondylosis is worst in the lower thoracic spine. Review of the MIP images confirms the above findings. IMPRESSION: Negative for pulmonary embolus or acute disease. No finding to explain the patient's symptoms. Gallstones. Small hiatal hernia. Aortic Atherosclerosis (ICD10-I70.0). Calcific  coronary artery disease also noted. Electronically Signed   By: Inge Rise M.D.   On: 12/10/2019 14:09    Procedures Procedures (including critical care time)  Medications Ordered in ED Medications  sodium chloride flush (NS) 0.9 % injection 3 mL (3 mLs Intravenous Not Given 12/10/19 1104)  iohexol (OMNIPAQUE) 350 MG/ML injection 50 mL (50 mLs Intravenous Contrast Given 12/10/19 1355)    ED Course  I have reviewed the triage vital signs and the nursing notes.  Pertinent labs & imaging results that were available during my care of the patient were reviewed by me and considered in my medical decision making (see chart for details).    MDM Rules/Calculators/A&P                      MDM:  Marthann Schiller is normal chest xray no acute abnormality. Troponin negative x 2. Ct chest no acute abnormality   Cardiology consulted and saw pt here in ED.  Pain most likely muscular.  Pt given rx for Robaxin and Hydrocodone  Final Clinical Impression(s) / ED Diagnoses Final diagnoses:  Chest pain, unspecified type    Rx / DC Orders ED Discharge Orders         Ordered    HYDROcodone-acetaminophen (NORCO/VICODIN) 5-325 MG tablet  Every 4 hours PRN     12/10/19 1715    methocarbamol (ROBAXIN) 500 MG tablet  2 times daily     12/10/19 1715        An After Visit Summary was printed and given to the patient.    Fransico Meadow, [place] 12/10/19 1844    Dorie Rank, MD 12/13/19 [phone]

## 2019-12-10 NOTE — Discharge Instructions (Addendum)
Return if any problems. See your Physician for recheck.  Watch carefully for rash.

## 2019-12-10 NOTE — ED Triage Notes (Signed)
Pt reports onset yesterday of severe left side chest pain that radiates through to his back. Reports feeling sob. No resp distress is noted at triage, ekg done.

## 2019-12-11 ENCOUNTER — Ambulatory Visit: Payer: Medicare HMO | Admitting: Family Medicine

## 2019-12-18 ENCOUNTER — Other Ambulatory Visit: Payer: Self-pay | Admitting: Family Medicine

## 2020-01-09 ENCOUNTER — Ambulatory Visit: Payer: Medicare HMO | Admitting: Family Medicine

## 2020-01-29 DIAGNOSIS — R258 Other abnormal involuntary movements: Secondary | ICD-10-CM | POA: Diagnosis not present

## 2020-01-29 DIAGNOSIS — R200 Anesthesia of skin: Secondary | ICD-10-CM | POA: Diagnosis not present

## 2020-01-29 DIAGNOSIS — R292 Abnormal reflex: Secondary | ICD-10-CM | POA: Diagnosis not present

## 2020-01-29 DIAGNOSIS — M4180 Other forms of scoliosis, site unspecified: Secondary | ICD-10-CM | POA: Diagnosis not present

## 2020-02-28 DIAGNOSIS — C61 Malignant neoplasm of prostate: Secondary | ICD-10-CM | POA: Diagnosis not present

## 2020-03-06 DIAGNOSIS — C61 Malignant neoplasm of prostate: Secondary | ICD-10-CM | POA: Diagnosis not present

## 2020-03-06 LAB — PSA: PSA: 11.6

## 2020-03-07 ENCOUNTER — Other Ambulatory Visit: Payer: Self-pay | Admitting: Urology

## 2020-03-07 DIAGNOSIS — C61 Malignant neoplasm of prostate: Secondary | ICD-10-CM

## 2020-03-11 DIAGNOSIS — M4312 Spondylolisthesis, cervical region: Secondary | ICD-10-CM | POA: Diagnosis not present

## 2020-03-11 DIAGNOSIS — M5441 Lumbago with sciatica, right side: Secondary | ICD-10-CM | POA: Diagnosis not present

## 2020-04-01 DIAGNOSIS — M5441 Lumbago with sciatica, right side: Secondary | ICD-10-CM | POA: Diagnosis not present

## 2020-04-01 DIAGNOSIS — M4312 Spondylolisthesis, cervical region: Secondary | ICD-10-CM | POA: Diagnosis not present

## 2020-04-01 DIAGNOSIS — M47816 Spondylosis without myelopathy or radiculopathy, lumbar region: Secondary | ICD-10-CM | POA: Diagnosis not present

## 2020-04-01 DIAGNOSIS — M5023 Other cervical disc displacement, cervicothoracic region: Secondary | ICD-10-CM | POA: Diagnosis not present

## 2020-04-01 DIAGNOSIS — M48061 Spinal stenosis, lumbar region without neurogenic claudication: Secondary | ICD-10-CM | POA: Diagnosis not present

## 2020-04-01 DIAGNOSIS — M47812 Spondylosis without myelopathy or radiculopathy, cervical region: Secondary | ICD-10-CM | POA: Diagnosis not present

## 2020-04-01 DIAGNOSIS — M4802 Spinal stenosis, cervical region: Secondary | ICD-10-CM | POA: Diagnosis not present

## 2020-04-03 DIAGNOSIS — M4712 Other spondylosis with myelopathy, cervical region: Secondary | ICD-10-CM | POA: Diagnosis not present

## 2020-04-03 DIAGNOSIS — M4180 Other forms of scoliosis, site unspecified: Secondary | ICD-10-CM | POA: Diagnosis not present

## 2020-04-03 DIAGNOSIS — Z6829 Body mass index (BMI) 29.0-29.9, adult: Secondary | ICD-10-CM | POA: Diagnosis not present

## 2020-04-03 DIAGNOSIS — I10 Essential (primary) hypertension: Secondary | ICD-10-CM | POA: Diagnosis not present

## 2020-04-03 DIAGNOSIS — M48062 Spinal stenosis, lumbar region with neurogenic claudication: Secondary | ICD-10-CM | POA: Diagnosis not present

## 2020-04-09 ENCOUNTER — Telehealth: Payer: Self-pay | Admitting: Family Medicine

## 2020-04-09 NOTE — Progress Notes (Signed)
  Chronic Care Management   Note  04/09/2020 Name: Anthony Mann MRN: [id number] DOB: 01-28-1946  Anthony Mann is a 74 y.o. year old male who is a primary care patient of Marin Olp, MD. I reached out to [facility] by phone today in response to a referral sent by Anthony Mann's PCP, Marin Olp, MD.   Anthony Mann was given information about Chronic Care Management services today including:  1. CCM service includes personalized support from designated clinical staff supervised by his physician, including individualized plan of care and coordination with other care providers 2. 24/7 contact phone numbers for assistance for urgent and routine care needs. 3. Service will only be billed when office clinical staff spend 20 minutes or more in a month to coordinate care. 4. Only one practitioner may furnish and bill the service in a calendar month. 5. The patient may stop CCM services at any time (effective at the end of the month) by phone call to the office staff.   Patient agreed to services and verbal consent obtained.   Follow up plan:   Earney Hamburg Upstream Scheduler

## 2020-04-12 ENCOUNTER — Ambulatory Visit
Admission: RE | Admit: 2020-04-12 | Discharge: 2020-04-12 | Disposition: A | Discharge status: Home / Self Care | Payer: Medicare HMO | Source: Ambulatory Visit | Attending: Urology | Admitting: Urology

## 2020-04-12 DIAGNOSIS — K4090 Unilateral inguinal hernia, without obstruction or gangrene, not specified as recurrent: Secondary | ICD-10-CM | POA: Diagnosis not present

## 2020-04-12 DIAGNOSIS — C61 Malignant neoplasm of prostate: Secondary | ICD-10-CM

## 2020-04-12 DIAGNOSIS — K5730 Diverticulosis of large intestine without perforation or abscess without bleeding: Secondary | ICD-10-CM | POA: Diagnosis not present

## 2020-04-12 IMAGING — MR MR PROSTATE WO/W CM
12 series · 48 of 48 positions shown · IV contrast (multihance)
Comparison: None.

CLINICAL DATA: Prostate carcinoma, Gleason score 3+3=6. Active
surveillance.

EXAM:
MR PROSTATE WITHOUT AND WITH CONTRAST
TECHNIQUE: Multiplanar multisequence MRI images were obtained of the pelvis
centered about the prostate. Pre and post contrast images were
obtained.
CONTRAST:  16mL MULTIHANCE GADOBENATE DIMEGLUMINE 529 MG/ML IV SOLN

[Series 3: T1 · axial · 5.0mm · 1.25mm/px · 1 of 80 slices shown]
[im 1/80]
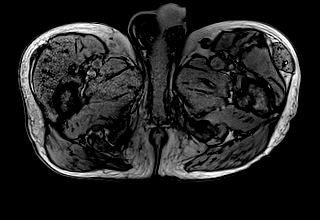

[Series 4: T2 · coronal · 3.0mm · 0.56mm/px · 1 of 23 slices shown (1 of 3)]
[im 1/23]
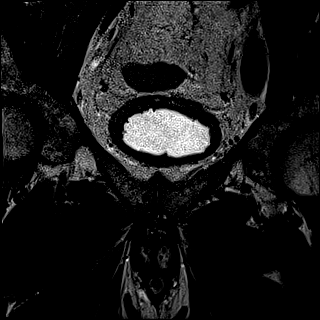

[Series 5: DWI · axial · 3.0mm · 1.75mm/px · 1 of 66 slices shown (1 of 3)]
[im 1/66]
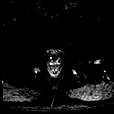

[Series 6: DWI · axial · 3.0mm · 1.75mm/px · 1 of 22 slices shown (2 of 3)]
[im 1/22]
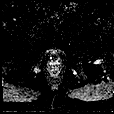

[Series 7: DWI · axial · 3.0mm · 1.75mm/px · 1 of 22 slices shown (3 of 3)]
[im 1/22]
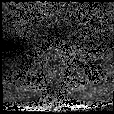

[Series 8: T2 · axial · 3.0mm · 0.56mm/px · 1 of 25 slices shown (2 of 3)]
[im 1/25]
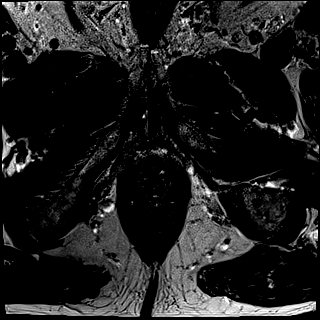

[Series 9: T2 · axial · 1.0mm · 1.04mm/px · z∈[+23,+102]mm · 2 of 80 slices shown (3 of 3)]
[im 1/80]
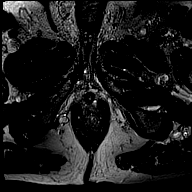
[im 80/80]
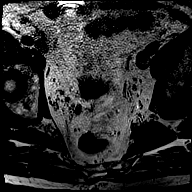

[Series 10: pre t1_twist_tra_dyn · axial · non-contrast · 3.5mm · 0.83mm/px · 1 of 22 slices shown]
[im 1/22]
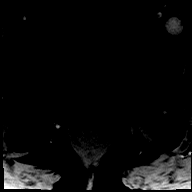

[Series 11: post t1_twist_tra_dyn-copy center · axial · non-contrast · 3.5mm · 0.83mm/px · z∈[+25,+98]mm · 18 of 660 slices shown]
[im 1/660]
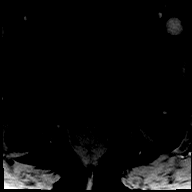
[im 39/660]
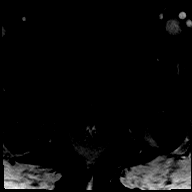
[im 78/660]
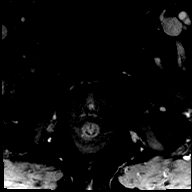
[im 117/660]
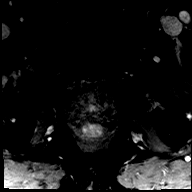
[im 156/660]
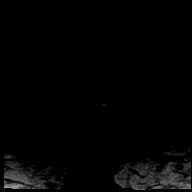
[im 194/660]
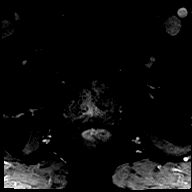
[im 233/660]
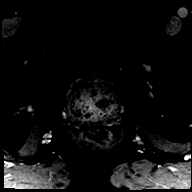
[im 272/660]
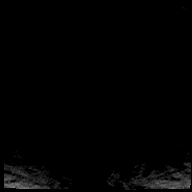
[im 311/660]
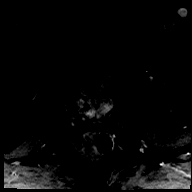
[im 349/660]
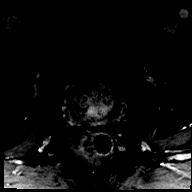
[im 388/660]
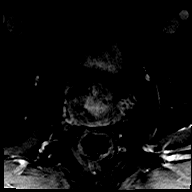
[im 427/660]
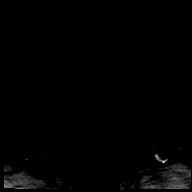
[im 466/660]
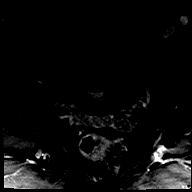
[im 504/660]
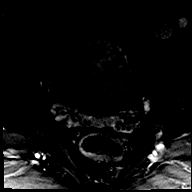
[im 543/660]
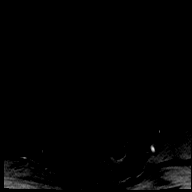
[im 582/660]
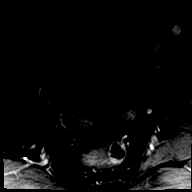
[im 621/660]
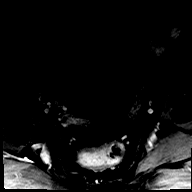
[im 660/660]
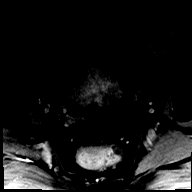

[Series 12: post t1_twist_tra_dyn-copy cent_sub · axial · 3.5mm · 0.83mm/px · z∈[+25,+98]mm · 17 of 612 slices shown]
[im 1/612]
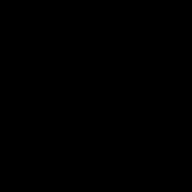
[im 39/612]
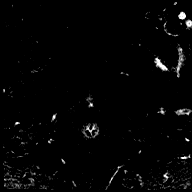
[im 77/612]
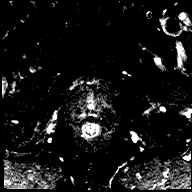
[im 115/612]
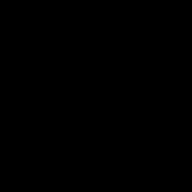
[im 153/612]
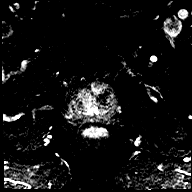
[im 191/612]
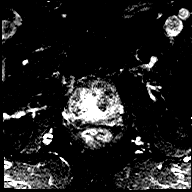
[im 230/612]
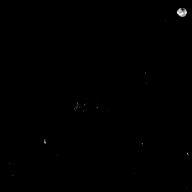
[im 268/612]
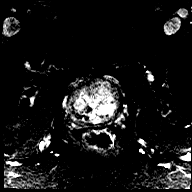
[im 306/612]
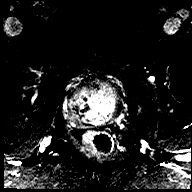
[im 344/612]
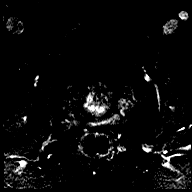
[im 382/612]
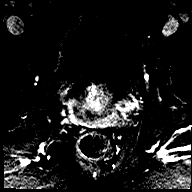
[im 421/612]
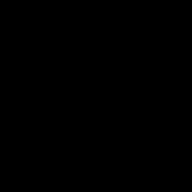
[im 459/612]
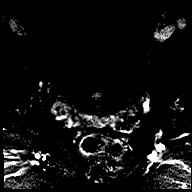
[im 497/612]
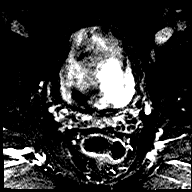
[im 535/612]
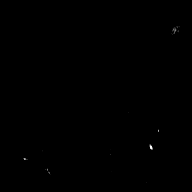
[im 573/612]
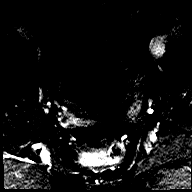
[im 612/612]
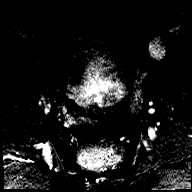

[Series 13: t1_vibe_dixon_tra_f · axial · 2.5mm · 0.91mm/px · z∈[+2,+200]mm · 2 of 80 slices shown]
[im 1/80]
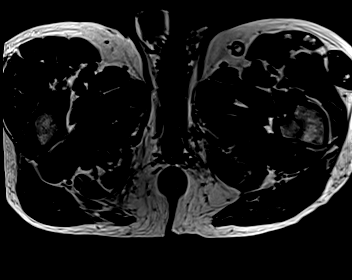
[im 80/80]
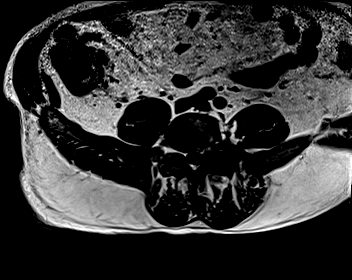

[Series 14: t1_vibe_dixon_tra_w · axial · 2.5mm · 0.91mm/px · z∈[+2,+200]mm · 2 of 80 slices shown]
[im 1/80]
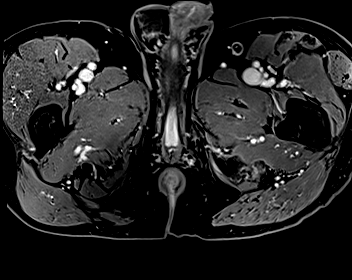
[im 80/80]
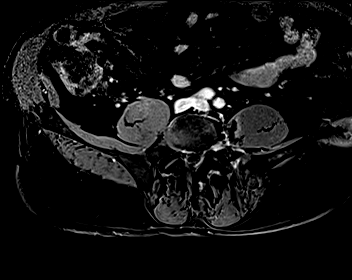

[48 of 48 positions shown; findings below may reference images not displayed]

FINDINGS: Prostate:

-- Peripheral Zone: Linear/wedge shaped hypointensity is seen on
ADC; however, no focal ADC hypointense or high b-value DWI
hyperintense nodules are identified.

-- Transition/Central Zone: Circumscribed BPH nodules are noted, but
no suspicious nodules with obscured or non-circumscribed margins
seen.

-- Measurements/Volume:  5.4 x 3.9 x 4.7 cm (volume = 52 cm^3)

Transcapsular spread:  Absent

Seminal vesicle involvement:  Absent

Neurovascular bundle involvement:  Absent

Pelvic adenopathy: None visualized

Bone metastasis: None visualized

Other: Sigmoid diverticulosis, without evidence of diverticulitis.
Tiny left inguinal hernia containing only fat.
IMPRESSION: No radiographic evidence of high-grade prostate carcinoma. PI-RADS
2: Low (clinically significant cancer is unlikely to be present).

## 2020-04-12 MED ORDER — GADOBENATE DIMEGLUMINE 529 MG/ML IV SOLN
16.0000 mL | Freq: Once | INTRAVENOUS | Status: AC | PRN
  Administered 2020-04-12: 16 mL via INTRAVENOUS

## 2020-04-28 ENCOUNTER — Ambulatory Visit: Payer: Medicare HMO

## 2020-04-29 ENCOUNTER — Ambulatory Visit: Payer: Medicare HMO

## 2020-05-08 NOTE — Patient Instructions (Addendum)
Please stop by lab before you go If you have mychart- we will send your results within 3 business days of [place] receiving them.  If you do not have mychart- we will call you about results within 5 business days of [place] receiving them.  *please note we are currently using [facility] which has a longer processing time than [facility] typically so labs may not come back as quickly as in the past *please also note that you will see labs on mychart as soon as they post. I will later go in and write notes on them- will say "notes from Dr. Yong Channel"  Health Maintenance Due  Topic Date Due  . INFLUENZA VACCINE -please let us know when you have gotten this. 04/13/2020   Blood pressure just a hair high in office.  His home ranges from 1 30-1 50 primarily-I wanted to bring his average down just slightly perhaps in the 1 25-1 45 range-increase amlodipine to 5 mg and continue hydrochlorothiazide and irbesartan  We will call you within two weeks about your referral to ultrasound of liver. If you do not hear within 3 weeks, give [place] a call.   Recommended follow up: Return in about 1 month (around 06/09/2020) for follow up- or sooner if needed. 6 months if blood pressure looking great with average <140/90 at home.

## 2020-05-08 NOTE — Progress Notes (Signed)
Phone: [phone]   Subjective:  Patient presents today for their annual physical. Chief complaint-noted.   See problem oriented charting- Review of Systems  Constitutional: Negative for chills and fever.  HENT: Negative for hearing loss and tinnitus.   Eyes: Negative for blurred vision and double vision.  Respiratory: Negative for cough and shortness of breath.   Cardiovascular: Negative for chest pain and palpitations.  Gastrointestinal: Negative for abdominal pain, nausea and vomiting.  Genitourinary: Negative for dysuria and frequency.  Musculoskeletal: Positive for back pain. Negative for myalgias.  Skin: Negative for itching and rash.  Neurological: Negative for dizziness and headaches.  Endo/Heme/Allergies: Negative for polydipsia. Does not bruise/bleed easily.  Psychiatric/Behavioral: Negative for depression, substance abuse and suicidal ideas.   The following were reviewed and entered/updated in epic: Past Medical History:  Diagnosis Date  . Cataract    right eye in [place], left here 10/2013  . History of hepatitis B   . Hyperlipidemia   . Hypertension   . Prostate cancer [facility]) 2018   Patient Active Problem List   Diagnosis Date Noted  . Prostate cancer ([place]) 08/03/2017    Priority: High  . Hepatitis B     Priority: High  . Depressed mood 06/27/2017    Priority: Medium  . Hyperlipidemia 07/16/2014    Priority: Medium  . HTN (hypertension) 10/14/2010    Priority: Medium  . Atypical chest pain 12/10/2019  . Low back pain 12/04/2019  . Laryngopharyngeal reflux (LPR) 10/30/2019  . History of adenomatous polyp of colon 05/11/2019  . Osteoarthritis of right knee 11/29/2017  . BPH associated with nocturia 01/26/2016   Past Surgical History:  Procedure Laterality Date  . arm trauma     he had an industrial accident in 1991   . COLONOSCOPY  03/24/2016  . EYE SURGERY     right eye cataract  in [place]  last July 2020   negative results per pt  .  SKIN GRAFT FULL THICKNESS ARM     right arm  . ULNAR NERVE TRANSPOSITION     in 1991    Family History  Problem Relation Age of Onset  . Cancer Father        colon  . Heart disease Father 87  . Kidney disease Father        nephectomy  . Colon cancer Father 48  . Diabetes Mother 77  . Colon polyps Neg Hx   . Esophageal cancer Neg Hx   . Rectal cancer Neg Hx   . Stomach cancer Neg Hx     Medications- reviewed and updated Current Outpatient Medications  Medication Sig Dispense Refill  . amLODipine (NORVASC) 5 MG tablet Take 1 tablet (5 mg total) by mouth daily. 90 tablet 3  . aspirin EC 81 MG tablet Take 81 mg by mouth daily.    . cyclobenzaprine (FLEXERIL) 10 MG tablet Take 10 mg by mouth 3 (three) times daily as needed.    Marland Kitchen HYDROcodone-acetaminophen (NORCO/VICODIN) 5-325 MG tablet Take 1 tablet by mouth every 4 (four) hours as needed for moderate pain. 10 tablet 0  . methocarbamol (ROBAXIN) 500 MG tablet Take 1 tablet (500 mg total) by mouth 2 (two) times daily. 20 tablet 0  . rosuvastatin (CRESTOR) 10 MG tablet TAKE 1 TABLET BY MOUTH EVERY DAY (Patient taking differently: Take 10 mg by mouth daily. ) 90 tablet 3  . hydrochlorothiazide (HYDRODIURIL) 25 MG tablet Take 1 tablet (25 mg total) by mouth daily. [place]  tablet 3  . irbesartan (AVAPRO) 300 MG tablet Take 1 tablet (300 mg total) by mouth daily. 90 tablet 3   No current facility-administered medications for this visit.    Allergies-reviewed and updated No Known Allergies  Social History   Social History Narrative   Moved to [place] in 2011 from [place] due to more affordable.       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       Married 1972 [place]). 2 kids. 5 grandkids. 2 in [place], 3 [place]. Son coaches at [employer] for soccer team and is a Writer from there.       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 Oropharynx normal. TM normal- narrow canal on left ear Neck: no thyromegaly CV: RRR no murmurs rubs or gallops Lungs: CTAB no crackles, wheeze, rhonchi Abdomen: soft/nontender/nondistended/normal bowel sounds. No rebound or guarding.  Ext: trace edema Skin: warm, dry Neuro: grossly normal, moves all extremities, PERRLA    Assessment and Plan  74 y.o. male presenting for annual physical.  Health Maintenance counseling: 1. Anticipatory guidance: Patient counseled regarding regular dental exam he wears dentures though , eye exams yearly,  avoiding smoking and second hand smoke , limiting alcohol to 2 beverages per day -  Less than once a week - with hep B could avoid even this   2. Risk factor reduction:  Advised patient of need for regular exercise and diet rich and fruits and vegetables to reduce risk of heart attack and stroke. Exercise-  in past has had pain with joggin on machine but has been able to do yard work to stay active despite back issues and upcoming surgery- doing some walking 15-20 minutes at soccer fields twice a week on grass. Diet-. Patient goal was to be down 10lbs at this visit. He continues to work on portion control. Continue gradual weight loss- wants to work on another 10-15 lbs. Doing more fruits/veggies/salads.  Wt Readings from Last 3 Encounters:  05/09/20 176 lb 3.2 oz (79.9 kg)  12/10/19 182 lb (82.6 kg)  12/04/19 182 lb (82.6 kg)  3. Immunizations/screenings/ancillary studies- advised fall flu shot and shingrix - defers for now Immunization History  Administered Date(s) Administered  . Influenza Split 07/13/2011  . Influenza, High Dose Seasonal PF 07/27/2016, 06/27/2017, 06/20/2019  . Influenza,inj,Quad  PF,6+ Mos 07/01/2014, 08/18/2015, 07/13/2018  . Influenza,inj,quad, With Preservative 07/27/2017  . Influenza-Unspecified 06/13/2014  . PFIZER SARS-COV-2 Vaccination 10/05/2019, 10/26/2019  . Pneumococcal Conjugate-13 03/11/2014  . Pneumococcal Polysaccharide-23 09/13/2008  . Pneumococcal-Unspecified 03/13/2014  . Td 10/14/2008  . Tdap 09/16/2011  . Zoster 09/14/2014   4. Prostate cancer screening- active surveillance prostate cancer. Benign biopsy 02/2019 and will continue to follow up ( on active surveillance 3+3=6 with Dr. Lovena Neighbours) . MRI looked great recently. psa was down slightly on that check Lab Results  Component Value Date   PSA 11.60 03/06/2020   PSA 12.40 08/17/2019   PSA 7.16 11/27/2018   5. Colon cancer screening -  last done on  05/08/2019 next due on 05/07/2024 due to adenoma 03/24/16 6. Skin cancer screening-no dermatologist. advised regular sunscreen use. Denies worrisome, changing, or new skin lesions. Wear's hat when working outside.  74. Former smoker quit 1990 and under 10 pack years. AAA scan declined at last visit and again today 8. STD screening - declines as monogamous  Status of chronic or acute concerns   #hyperlipidemia S: Medication: Crestor 10Mg  Lab Results  Component Value Date   CHOL 113 11/05/2019   HDL 46.70 11/05/2019   LDLCALC 48 11/05/2019   LDLDIRECT 42.0 04/19/2019   TRIG 91.0 11/05/2019   CHOLHDL 2 11/05/2019   A/P: Excellent control last check.  Update direct LDL today  #hypertension S: medication: Amlodipine 2.5Mg , HCTZ 25Mg , Irbesartan 300Mg  Home readings #s: 130s to 150s/60s or 70s BP Readings from Last 3 Encounters:  05/09/20 (!) 142/80  12/10/19 (!) 147/50  12/04/19 (!) 148/82  A/P: Blood pressure just a hair high in office.  His home ranges from 1 30-1 50 primarily-I wanted to bring his average down just slightly perhaps in the 1 25-1 45 range-increase amlodipine to 5 mg and continue hydrochlorothiazide and irbesartan  #Chronic  hepatitis B-we will monitor ultrasound potential development of hepatocellular carcinoma.  Update hepatitis B DNA every 6 months  #Back surgery-has upcoming back surgery with Dr. Arnoldo Morale.  Able to complete 4 METS of activity without chest pain or shortness of breath-just in case patient needs surgical clearance  Recommended follow up: Return in about 1 month (around 06/09/2020) for follow up- or sooner if needed. 6 months if blood pressure looking great with average <140/90 at home.   Lab/Order associations: not fasting   ICD-10-CM   1. Preventative health care  W58.09 COMPLETE METABOLIC PANEL WITH GFR    CBC with Differential/Platelet    US Abdomen Limited RUQ    Hepatitis B DNA, ultraquantitative, PCR    LDL cholesterol, direct  2. Essential hypertension  I10   3. Hyperlipidemia, unspecified hyperlipidemia type  X83.3 COMPLETE METABOLIC PANEL WITH GFR    CBC with Differential/Platelet    LDL cholesterol, direct  4. Prostate cancer ([place])  C61   5. Chronic viral hepatitis B without delta agent and without coma (HCC)  B18.1 US Abdomen Limited RUQ    Hepatitis B DNA, ultraquantitative, PCR    Meds ordered this encounter  Medications  . hydrochlorothiazide (HYDRODIURIL) 25 MG tablet    Sig: Take 1 tablet (25 mg total) by mouth daily.    Dispense:  90 tablet    Refill:  3  . irbesartan (AVAPRO) 300 MG tablet    Sig: Take 1 tablet (300 mg total) by mouth daily.    Dispense:  90 tablet    Refill:  3  . amLODipine (NORVASC) 5 MG tablet    Sig: Take 1 tablet (5 mg total) by mouth daily.    Dispense:  90 tablet    Refill:  3    Return precautions advised.  Garret Reddish, MD

## 2020-05-09 ENCOUNTER — Other Ambulatory Visit: Payer: Self-pay

## 2020-05-09 ENCOUNTER — Ambulatory Visit (INDEPENDENT_AMBULATORY_CARE_PROVIDER_SITE_OTHER): Payer: Medicare HMO | Admitting: Family Medicine

## 2020-05-09 ENCOUNTER — Encounter: Payer: Self-pay | Admitting: Family Medicine

## 2020-05-09 VITALS — BP 142/80 | HR 62 | Temp 98.4°F | Ht 65.0 in | Wt 176.2 lb

## 2020-05-09 DIAGNOSIS — E785 Hyperlipidemia, unspecified: Secondary | ICD-10-CM | POA: Diagnosis not present

## 2020-05-09 DIAGNOSIS — B181 Chronic viral hepatitis B without delta-agent: Secondary | ICD-10-CM

## 2020-05-09 DIAGNOSIS — R69 Illness, unspecified: Secondary | ICD-10-CM | POA: Diagnosis not present

## 2020-05-09 DIAGNOSIS — I10 Essential (primary) hypertension: Secondary | ICD-10-CM

## 2020-05-09 DIAGNOSIS — Z0000 Encounter for general adult medical examination without abnormal findings: Secondary | ICD-10-CM | POA: Diagnosis not present

## 2020-05-09 DIAGNOSIS — C61 Malignant neoplasm of prostate: Secondary | ICD-10-CM | POA: Diagnosis not present

## 2020-05-09 LAB — CBC WITH DIFFERENTIAL/PLATELET
Absolute Monocytes: 816 cells/uL (ref 200–950)
Basophils Absolute: 48 cells/uL (ref 0–200)
Basophils Relative: 0.7 %
Eosinophils Absolute: 129 cells/uL (ref 15–500)
Eosinophils Relative: 1.9 %
HCT: 44.4 % (ref 38.5–50.0)
Hemoglobin: 15.3 g/dL (ref 13.2–17.1)
Lymphs Abs: 1686 cells/uL (ref 850–3900)
MCH: 32.7 pg (ref 27.0–33.0)
MCHC: 34.5 g/dL (ref 32.0–36.0)
MCV: 94.9 fL (ref 80.0–100.0)
MPV: 10.7 fL (ref 7.5–12.5)
Monocytes Relative: 12 %
Neutro Abs: 4121 cells/uL (ref 1500–7800)
Neutrophils Relative %: 60.6 %
Platelets: 195 10*3/uL (ref 140–400)
RBC: 4.68 10*6/uL (ref 4.20–5.80)
RDW: 12.4 % (ref 11.0–15.0)
Total Lymphocyte: 24.8 %
WBC: 6.8 10*3/uL (ref 3.8–10.8)

## 2020-05-09 MED ORDER — IRBESARTAN 300 MG PO TABS: 300.0000 mg | ORAL_TABLET | Freq: Every day | ORAL | 3 refills | Status: DC

## 2020-05-09 MED ORDER — HYDROCHLOROTHIAZIDE 25 MG PO TABS: 25.0000 mg | ORAL_TABLET | Freq: Every day | ORAL | 3 refills | Status: DC

## 2020-05-09 MED ORDER — AMLODIPINE BESYLATE 5 MG PO TABS: 5.0000 mg | ORAL_TABLET | Freq: Every day | ORAL | 3 refills | Status: DC

## 2020-05-09 NOTE — Addendum Note (Signed)
Addended by: Marin Olp on: 05/09/2020 11:43 AM   Modules accepted: Orders

## 2020-05-09 NOTE — Addendum Note (Signed)
Addended by: Liliane Channel on: 05/09/2020 11:16 AM   Modules accepted: Orders

## 2020-05-12 LAB — COMPLETE METABOLIC PANEL WITH GFR
AG Ratio: 2.3 (calc) (ref 1.0–2.5)
ALT: 17 U/L (ref 9–46)
AST: 18 U/L (ref 10–35)
Albumin: 4.8 g/dL (ref 3.6–5.1)
Alkaline phosphatase (APISO): 36 U/L (ref 35–144)
BUN: 17 mg/dL (ref 7–25)
CO2: 27 mmol/L (ref 20–32)
Calcium: 10.3 mg/dL (ref 8.6–10.3)
Chloride: 102 mmol/L (ref 98–110)
Creat: 1.12 mg/dL (ref 0.70–1.18)
GFR, Est African American: 76 mL/min/{1.73_m2} (ref >=60)
GFR, Est Non African American: 65 mL/min/{1.73_m2} (ref >=60)
Globulin: 2.1 g/dL (calc) (ref 1.9–3.7)
Glucose, Bld: 100 mg/dL — ABNORMAL HIGH (ref 65–99)
Potassium: 4.5 mmol/L (ref 3.5–5.3)
Sodium: 138 mmol/L (ref 135–146)
Total Bilirubin: 1.5 mg/dL — ABNORMAL HIGH (ref 0.2–1.2)
Total Protein: 6.9 g/dL (ref 6.1–8.1)

## 2020-05-12 LAB — HEPATITIS B DNA, ULTRAQUANTITATIVE, PCR
Hepatitis B DNA (Calc): 3.22 Log IU/mL — ABNORMAL HIGH
Hepatitis B DNA: 1650 IU/mL — ABNORMAL HIGH

## 2020-05-12 LAB — LDL CHOLESTEROL, DIRECT: Direct LDL: 45 mg/dL (ref <100)

## 2020-05-14 DIAGNOSIS — M4712 Other spondylosis with myelopathy, cervical region: Secondary | ICD-10-CM | POA: Diagnosis not present

## 2020-05-16 ENCOUNTER — Other Ambulatory Visit: Payer: Medicare HMO

## 2020-05-21 ENCOUNTER — Ambulatory Visit
Admission: RE | Admit: 2020-05-21 | Discharge: 2020-05-21 | Disposition: A | Discharge status: Home / Self Care | Payer: Medicare HMO | Source: Ambulatory Visit | Attending: Family Medicine | Admitting: Family Medicine

## 2020-05-21 DIAGNOSIS — Z0000 Encounter for general adult medical examination without abnormal findings: Secondary | ICD-10-CM

## 2020-05-21 DIAGNOSIS — B181 Chronic viral hepatitis B without delta-agent: Secondary | ICD-10-CM

## 2020-05-21 DIAGNOSIS — R69 Illness, unspecified: Secondary | ICD-10-CM | POA: Diagnosis not present

## 2020-05-21 IMAGING — US US ABDOMEN LIMITED
1 series · 14 of 25 positions shown · non-contrast
Comparison: Abdominal ultrasound [DATE]

CLINICAL DATA: Hepatitis-B follow-up, HCC rule out.

EXAM:
ULTRASOUND ABDOMEN LIMITED RIGHT UPPER QUADRANT

[Series 1: us abdomen limited · 0.18mm/px · 14 of 50 slices shown]
[im 1/50]
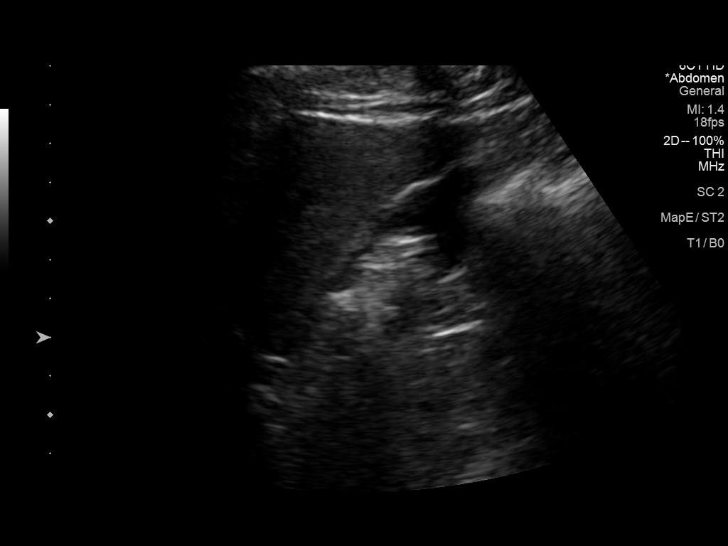
[im 5/50]
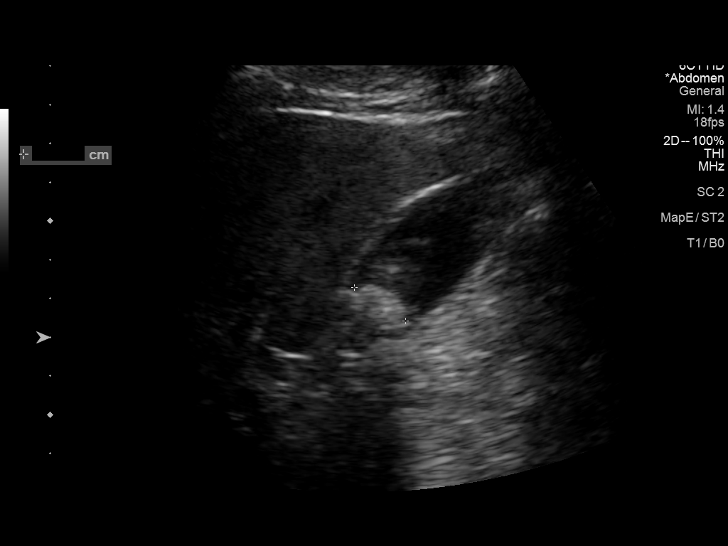
[im 9/50]
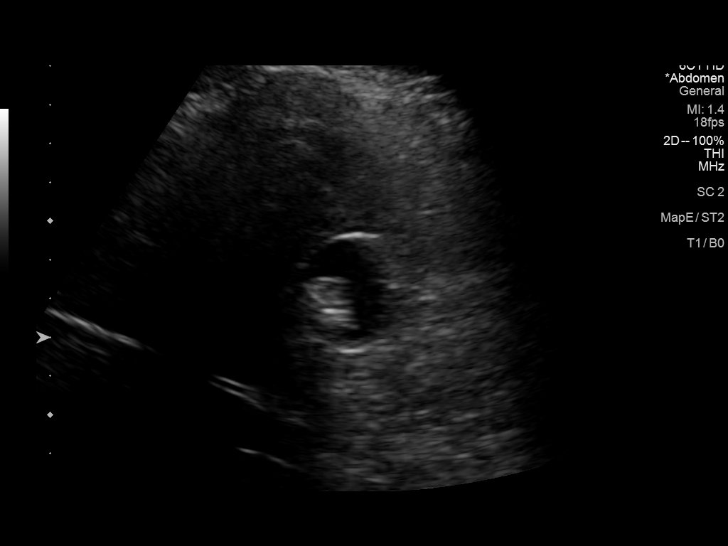
[im 13/50]
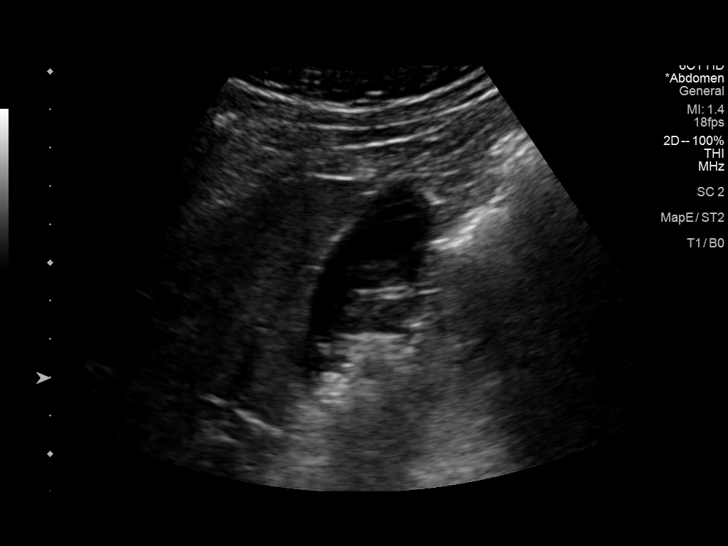
[im 17/50]
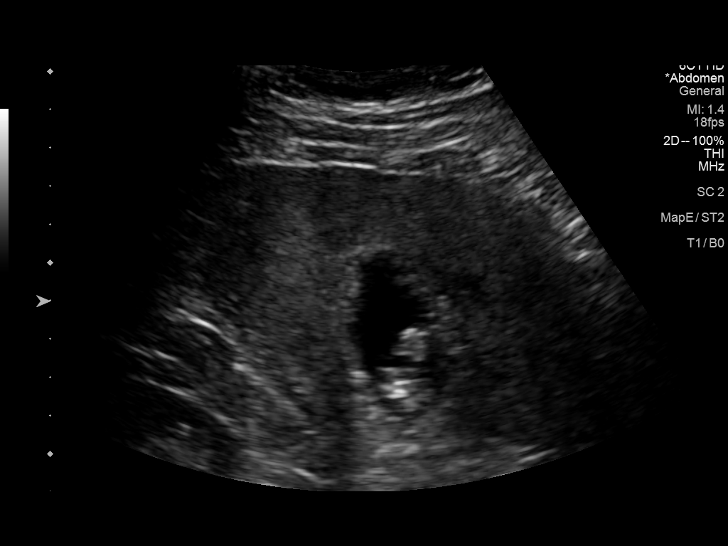
[im 19/50]
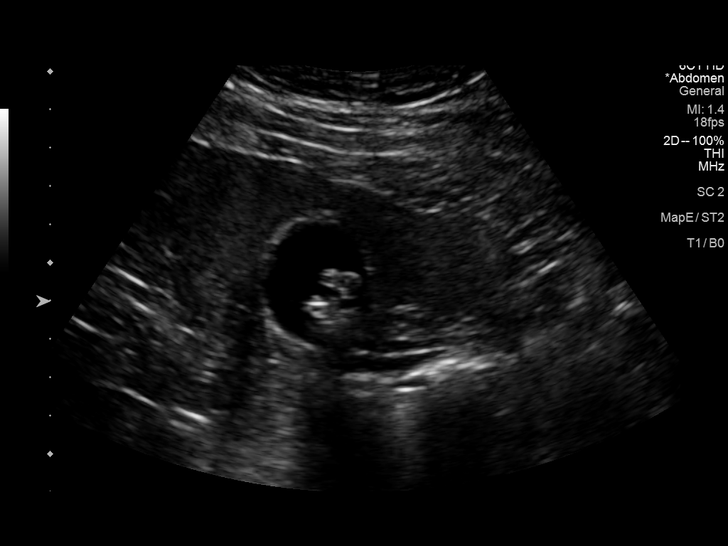
[im 23/50]
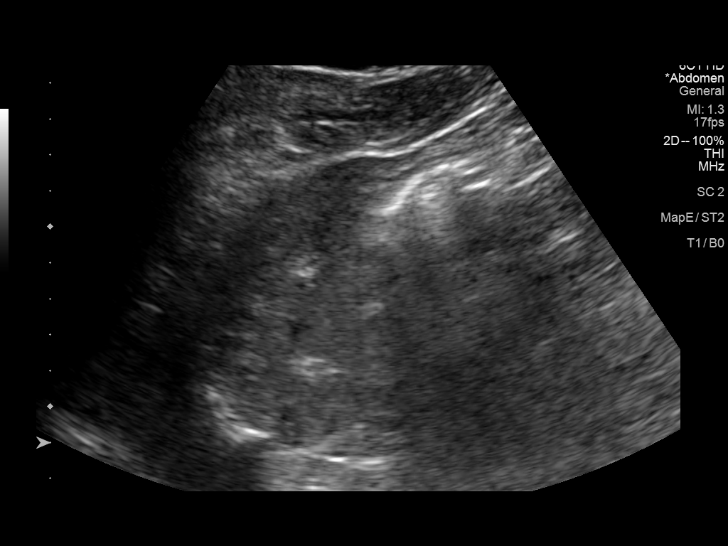
[im 27/50]
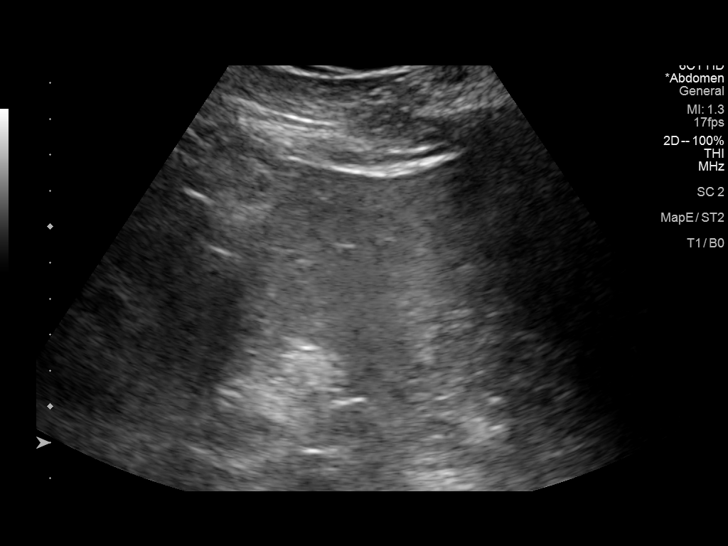
[im 31/50]
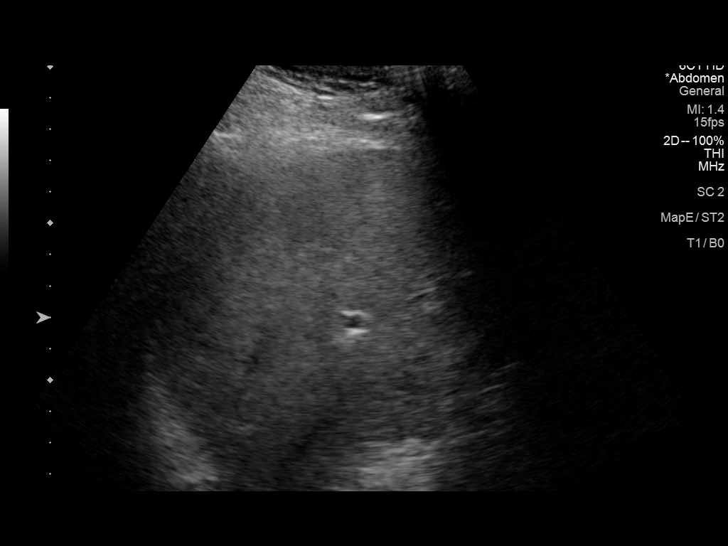
[im 33/50]
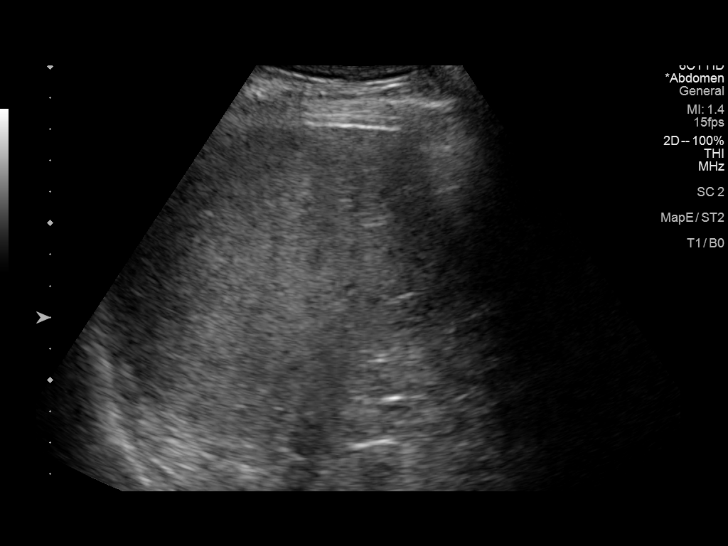
[im 37/50]
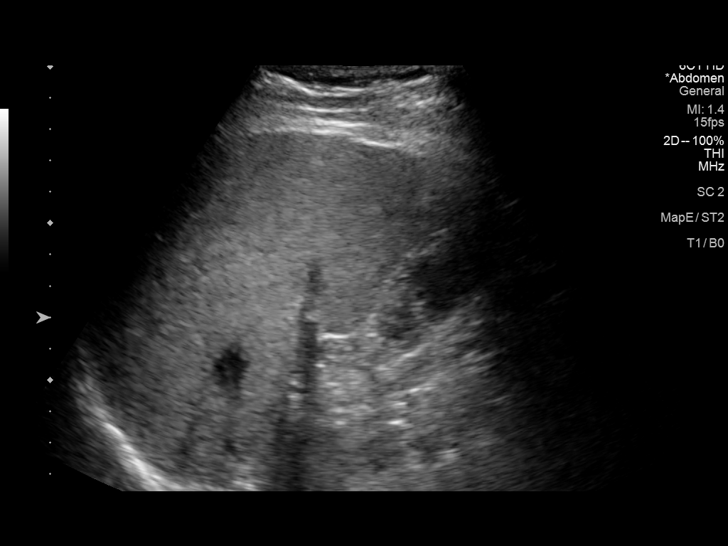
[im 41/50]
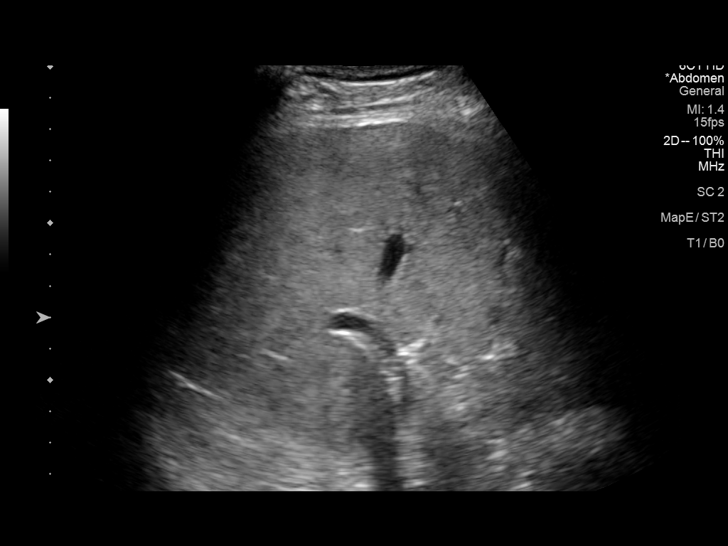
[im 45/50]
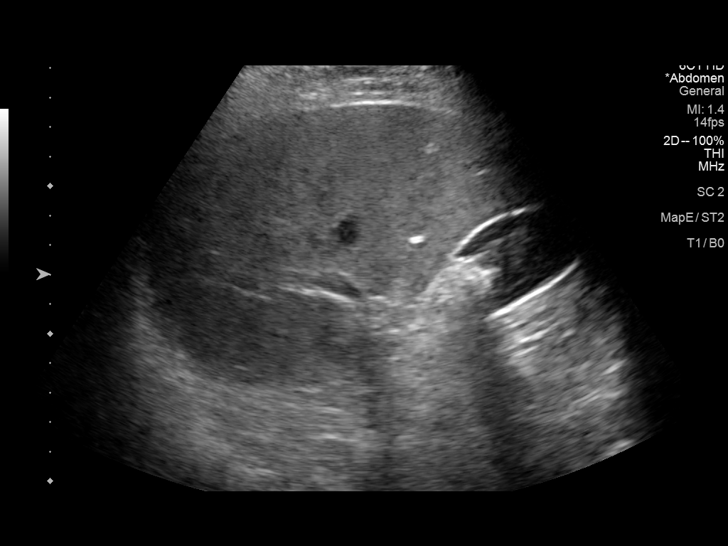
[im 50/50]
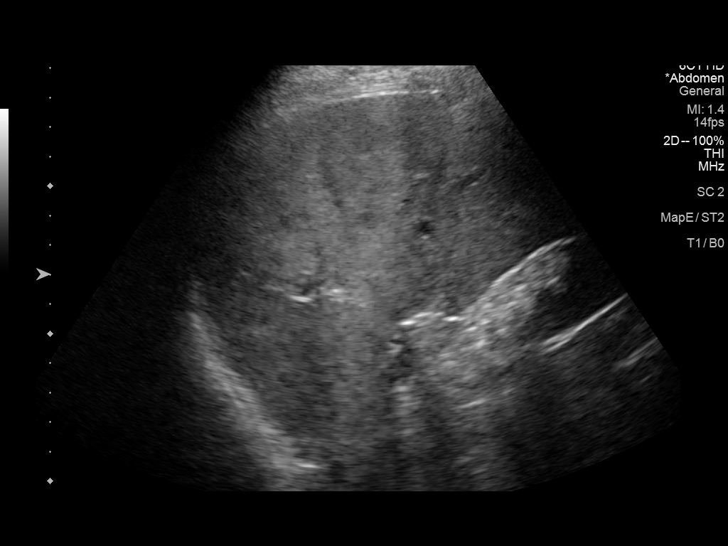

[14 of 25 positions shown; findings below may reference images not displayed]

FINDINGS: Gallbladder:

Physiologically distended with multiple intraluminal gallstones. No
gallbladder wall thickening or pericholecystic fluid. No sonographic
Murphy sign noted by sonographer.

Common bile duct:

Diameter: 4 mm.

Liver:

No focal lesion identified. Heterogeneous and slightly increased in
parenchymal echogenicity. There is no definite capsular nodularity.
Calcified granuloma is seen adjacent the gallbladder fossa. Portal
vein is patent on color Doppler imaging with normal direction of
blood flow towards the liver.

Other: No right upper quadrant ascites.
IMPRESSION: 1. No evidence of hepatoma. Heterogeneous and slightly increased
hepatic echogenicity suggesting steatosis.
2. Gallstones without sonographic findings of acute cholecystitis.
No biliary dilatation.

## 2020-06-12 DIAGNOSIS — M4802 Spinal stenosis, cervical region: Secondary | ICD-10-CM | POA: Diagnosis not present

## 2020-06-12 DIAGNOSIS — M4712 Other spondylosis with myelopathy, cervical region: Secondary | ICD-10-CM | POA: Diagnosis not present

## 2020-07-01 DIAGNOSIS — R69 Illness, unspecified: Secondary | ICD-10-CM | POA: Diagnosis not present

## 2020-07-08 DIAGNOSIS — M4712 Other spondylosis with myelopathy, cervical region: Secondary | ICD-10-CM | POA: Diagnosis not present

## 2020-07-15 DIAGNOSIS — M4712 Other spondylosis with myelopathy, cervical region: Secondary | ICD-10-CM | POA: Diagnosis not present

## 2020-07-15 DIAGNOSIS — M6281 Muscle weakness (generalized): Secondary | ICD-10-CM | POA: Diagnosis not present

## 2020-07-23 DIAGNOSIS — M4712 Other spondylosis with myelopathy, cervical region: Secondary | ICD-10-CM | POA: Diagnosis not present

## 2020-07-23 DIAGNOSIS — M6281 Muscle weakness (generalized): Secondary | ICD-10-CM | POA: Diagnosis not present

## 2020-07-29 DIAGNOSIS — R69 Illness, unspecified: Secondary | ICD-10-CM | POA: Diagnosis not present

## 2020-07-30 DIAGNOSIS — M6281 Muscle weakness (generalized): Secondary | ICD-10-CM | POA: Diagnosis not present

## 2020-07-30 DIAGNOSIS — M4712 Other spondylosis with myelopathy, cervical region: Secondary | ICD-10-CM | POA: Diagnosis not present

## 2020-08-12 DIAGNOSIS — M6281 Muscle weakness (generalized): Secondary | ICD-10-CM | POA: Diagnosis not present

## 2020-08-12 DIAGNOSIS — M4712 Other spondylosis with myelopathy, cervical region: Secondary | ICD-10-CM | POA: Diagnosis not present

## 2020-08-25 DIAGNOSIS — C61 Malignant neoplasm of prostate: Secondary | ICD-10-CM | POA: Diagnosis not present

## 2020-09-04 DIAGNOSIS — R972 Elevated prostate specific antigen [PSA]: Secondary | ICD-10-CM | POA: Diagnosis not present

## 2020-09-04 DIAGNOSIS — C61 Malignant neoplasm of prostate: Secondary | ICD-10-CM | POA: Diagnosis not present

## 2020-09-09 DIAGNOSIS — Z6827 Body mass index (BMI) 27.0-27.9, adult: Secondary | ICD-10-CM | POA: Diagnosis not present

## 2020-09-09 DIAGNOSIS — I10 Essential (primary) hypertension: Secondary | ICD-10-CM | POA: Diagnosis not present

## 2020-09-09 DIAGNOSIS — Z981 Arthrodesis status: Secondary | ICD-10-CM | POA: Diagnosis not present

## 2020-09-09 DIAGNOSIS — M25512 Pain in left shoulder: Secondary | ICD-10-CM | POA: Diagnosis not present

## 2020-09-19 ENCOUNTER — Ambulatory Visit: Payer: Medicare HMO

## 2020-10-07 DIAGNOSIS — R29898 Other symptoms and signs involving the musculoskeletal system: Secondary | ICD-10-CM | POA: Diagnosis not present

## 2020-10-17 DIAGNOSIS — M25512 Pain in left shoulder: Secondary | ICD-10-CM | POA: Diagnosis not present

## 2020-10-21 DIAGNOSIS — R29898 Other symptoms and signs involving the musculoskeletal system: Secondary | ICD-10-CM | POA: Diagnosis not present

## 2020-10-21 DIAGNOSIS — M542 Cervicalgia: Secondary | ICD-10-CM | POA: Diagnosis not present

## 2020-11-04 DIAGNOSIS — M545 Low back pain, unspecified: Secondary | ICD-10-CM | POA: Diagnosis not present

## 2020-11-04 DIAGNOSIS — Z6827 Body mass index (BMI) 27.0-27.9, adult: Secondary | ICD-10-CM | POA: Diagnosis not present

## 2020-11-04 DIAGNOSIS — I10 Essential (primary) hypertension: Secondary | ICD-10-CM | POA: Diagnosis not present

## 2020-11-04 DIAGNOSIS — M5412 Radiculopathy, cervical region: Secondary | ICD-10-CM | POA: Diagnosis not present

## 2020-11-06 DIAGNOSIS — M47816 Spondylosis without myelopathy or radiculopathy, lumbar region: Secondary | ICD-10-CM | POA: Diagnosis not present

## 2020-11-07 NOTE — Patient Instructions (Addendum)
  Health Maintenance Due  Topic Date Due  . COVID-19 Vaccine (3 - Pfizer risk 4-dose series) will call back with the dates.  11/23/2019   Please stop by lab before you go If you have mychart- we will send your results within 3 business days of [place] receiving them.  If you do not have mychart- we will call you about results within 5 business days of [place] receiving them.  *please also note that you will see labs on mychart as soon as they post. I will later go in and write notes on them- will say "notes from Dr. Yong Channel"  You are eligible to schedule your annual wellness visit with our nurse specialist Otila Kluver.  Please consider scheduling this before you leave today   Recommended follow up: Return in about 6 months (around 05/14/2021) for physical or sooner if needed.

## 2020-11-07 NOTE — Progress Notes (Signed)
Phone [phone] In person visit   Subjective:   Anthony Mann is a 75 y.o. year old very pleasant male patient who presents for/with See problem oriented charting Chief Complaint  Patient presents with  . Hyperlipidemia  . Hypertension    This visit occurred during the SARS-CoV-2 public health emergency.  Safety protocols were in place, including screening questions prior to the visit, additional usage of staff PPE, and extensive cleaning of exam room while observing appropriate contact time as indicated for disinfecting solutions.   Past Medical History-  Patient Active Problem List   Diagnosis Date Noted  . Prostate cancer ([place]) 08/03/2017    Priority: High  . Hepatitis B     Priority: High  . Depressed mood 06/27/2017    Priority: Medium  . Hyperlipidemia 07/16/2014    Priority: Medium  . HTN (hypertension) 10/14/2010    Priority: Medium  . Atypical chest pain 12/10/2019  . Low back pain 12/04/2019  . Laryngopharyngeal reflux (LPR) 10/30/2019  . History of adenomatous polyp of colon 05/11/2019  . Osteoarthritis of right knee 11/29/2017  . BPH associated with nocturia 01/26/2016    Medications- reviewed and updated Current Outpatient Medications  Medication Sig Dispense Refill  . amLODipine (NORVASC) 5 MG tablet Take 1 tablet (5 mg total) by mouth daily. 90 tablet 3  . aspirin EC 81 MG tablet Take 81 mg by mouth daily.    . cyclobenzaprine (FLEXERIL) 10 MG tablet Take 10 mg by mouth 3 (three) times daily as needed.    . hydrochlorothiazide (HYDRODIURIL) 25 MG tablet Take 1 tablet (25 mg total) by mouth daily. 90 tablet 3  . HYDROcodone-acetaminophen (NORCO/VICODIN) 5-325 MG tablet Take 1 tablet by mouth every 4 (four) hours as needed for moderate pain. 10 tablet 0  . irbesartan (AVAPRO) 300 MG tablet Take 1 tablet (300 mg total) by mouth daily. 90 tablet 3  . methocarbamol (ROBAXIN) 500 MG tablet Take 1 tablet (500 mg total) by mouth 2 (two) times daily. 20 tablet 0   . rosuvastatin (CRESTOR) 10 MG tablet TAKE 1 TABLET BY MOUTH EVERY DAY 90 tablet 3   No current facility-administered medications for this visit.     Objective:  BP 132/78   Pulse 81   Temp 98 F (36.7 C) (Temporal)   Ht 5\' 5"  (1.651 m)   Wt 167 lb (75.8 kg)   SpO2 98%   BMI 27.79 kg/m  Gen: NAD, resting comfortably CV: RRR no murmurs rubs or gallops Lungs: CTAB no crackles, wheeze, rhonchi Ext: no edema Skin: warm, dry Msk: visible musle loss in left shoulder/arm- cannot lift arm after cervical disc surgery    Assessment and Plan   #hyperlipidemia S: Medication: Rosuvastatin 10 mg. Also prefers to take aspirin Lab Results  Component Value Date   CHOL 113 11/05/2019   HDL 46.70 11/05/2019   LDLCALC 48 11/05/2019   LDLDIRECT 45 05/09/2020   TRIG 91.0 11/05/2019   CHOLHDL 2 11/05/2019   A/P: Well-controlled in the past-due for lipid panel  #hypertension S: medication: Irbesartan 300Mg , HCTZ 25Mg , Amlodipine 5Mg  Home readings #s: Last visit home systolic readings 1 10-6 50 primarily-we wanted to try to bring his range to perhaps 1 25-1 45 range and increase amlodipine to 5 mg at bedtime.   Today he reports 121/66 up to 156/87 on high end. Most of numbers in 120s or 130s.  BP Readings from Last 3 Encounters:  11/11/20 132/78  05/09/20 (!) 142/80  12/10/19 (!) 147/50  A/P: improved/now controlled. Continue current medications with increase in amlodipine to 5 mg  #Chronic hepatitis B S:Stable on most recent checks.  Reassuring ultrasound 6 months ago A/P: Update hepatitis B DNA every 6 months along with CMP.  Will be due for another ultrasound next visit  #cervical surgery-patient had surgery with Dr. Arnoldo Morale after last visit August 2021-patient reports surgery on  C4-5- has not been able to lift left arm since surgery unfortunately and still having a lot of pain in shoulder. Cant dress himself, cant take shower, cant tie shoes- really challenging.  -low back also  bothering him  #Prostate cancer-patient remains under active surveillance since 2018. Has had several biopsies in the past. PSA trend has been reassuring overall -he sees them/urology in about another month   Recommended follow up: Return in about 6 months (around 05/14/2021) for physical or sooner if needed.  Lab/Order associations:   ICD-10-CM   1. Primary hypertension  I10 Comprehensive metabolic panel    CBC with Differential/Platelet    Lipid panel  2. Hyperlipidemia, unspecified hyperlipidemia type  E78.5 Comprehensive metabolic panel    CBC with Differential/Platelet    Lipid panel  3. Prostate cancer ([place])  C61   4. Chronic viral hepatitis B without delta agent and without coma (HCC)  B18.1 Hepatitis B DNA, ultraquantitative, PCR   Return precautions advised.  Garret Reddish, MD

## 2020-11-10 ENCOUNTER — Other Ambulatory Visit: Payer: Self-pay | Admitting: Family Medicine

## 2020-11-11 ENCOUNTER — Encounter: Payer: Self-pay | Admitting: Family Medicine

## 2020-11-11 ENCOUNTER — Other Ambulatory Visit: Payer: Self-pay

## 2020-11-11 ENCOUNTER — Ambulatory Visit (INDEPENDENT_AMBULATORY_CARE_PROVIDER_SITE_OTHER): Payer: Medicare HMO | Admitting: Family Medicine

## 2020-11-11 VITALS — BP 132/78 | HR 81 | Temp 98.0°F | Ht 65.0 in | Wt 167.0 lb

## 2020-11-11 DIAGNOSIS — I10 Essential (primary) hypertension: Secondary | ICD-10-CM

## 2020-11-11 DIAGNOSIS — C61 Malignant neoplasm of prostate: Secondary | ICD-10-CM | POA: Diagnosis not present

## 2020-11-11 DIAGNOSIS — E785 Hyperlipidemia, unspecified: Secondary | ICD-10-CM

## 2020-11-11 DIAGNOSIS — B181 Chronic viral hepatitis B without delta-agent: Secondary | ICD-10-CM

## 2020-11-11 DIAGNOSIS — R69 Illness, unspecified: Secondary | ICD-10-CM | POA: Diagnosis not present

## 2020-11-11 LAB — COMPREHENSIVE METABOLIC PANEL
ALT: 17 U/L (ref 0–53)
AST: 18 U/L (ref 0–37)
Albumin: 4.8 g/dL (ref 3.5–5.2)
Alkaline Phosphatase: 37 U/L — ABNORMAL LOW (ref 39–117)
BUN: 14 mg/dL (ref 6–23)
CO2: 29 mEq/L (ref 19–32)
Calcium: 10.6 mg/dL — ABNORMAL HIGH (ref 8.4–10.5)
Chloride: 100 mEq/L (ref 96–112)
Creatinine, Ser: 0.95 mg/dL (ref 0.40–1.50)
GFR: 79.43 mL/min (ref >60.00)
Glucose, Bld: 90 mg/dL (ref 70–99)
Potassium: 4.4 mEq/L (ref 3.5–5.1)
Sodium: 135 mEq/L (ref 135–145)
Total Bilirubin: 1.1 mg/dL (ref 0.2–1.2)
Total Protein: 7.7 g/dL (ref 6.0–8.3)

## 2020-11-11 LAB — CBC WITH DIFFERENTIAL/PLATELET
Basophils Absolute: 0 10*3/uL (ref 0.0–0.1)
Basophils Relative: 0.6 % (ref 0.0–3.0)
Eosinophils Absolute: 0.1 10*3/uL (ref 0.0–0.7)
Eosinophils Relative: 1.8 % (ref 0.0–5.0)
HCT: 47.9 % (ref 39.0–52.0)
Hemoglobin: 16.2 g/dL (ref 13.0–17.0)
Lymphocytes Relative: 34.2 % (ref 12.0–46.0)
Lymphs Abs: 2.6 10*3/uL (ref 0.7–4.0)
MCHC: 33.9 g/dL (ref 30.0–36.0)
MCV: 93.3 fl (ref 78.0–100.0)
Monocytes Absolute: 0.8 10*3/uL (ref 0.1–1.0)
Monocytes Relative: 11.1 % (ref 3.0–12.0)
Neutro Abs: 3.9 10*3/uL (ref 1.4–7.7)
Neutrophils Relative %: 52.3 % (ref 43.0–77.0)
Platelets: 226 10*3/uL (ref 150.0–400.0)
RBC: 5.13 Mil/uL (ref 4.22–5.81)
RDW: 12.8 % (ref 11.5–15.5)
WBC: 7.5 10*3/uL (ref 4.0–10.5)

## 2020-11-11 LAB — LIPID PANEL
Cholesterol: 106 mg/dL (ref 0–200)
HDL: 50.4 mg/dL (ref >39.00)
LDL Cholesterol: 29 mg/dL (ref 0–99)
NonHDL: 55.72
Total CHOL/HDL Ratio: 2
Triglycerides: 134 mg/dL (ref 0.0–149.0)
VLDL: 26.8 mg/dL (ref 0.0–40.0)

## 2020-11-11 NOTE — Assessment & Plan Note (Signed)
S: medication: Irbesartan 300Mg , HCTZ 25Mg , Amlodipine 5Mg  Home readings #s: Last visit home systolic readings 1 75-8 50 primarily-we wanted to try to bring his range to perhaps 1 25-1 45 range and increase amlodipine to 5 mg at bedtime.   Today he reports 121/66 up to 156/87 on high end. Most of numbers in 120s or 130s.  BP Readings from Last 3 Encounters:  11/11/20 132/78  05/09/20 (!) 142/80  12/10/19 (!) 147/50  A/P: improved/now controlled. Continue current medications with increase in amlodipine to 5 mg

## 2020-11-12 ENCOUNTER — Telehealth: Payer: Self-pay | Admitting: Family Medicine

## 2020-11-12 NOTE — Telephone Encounter (Signed)
Left message for patient to call back and schedule Medicare Annual Wellness Visit (AWV) either virtually OR in office.   Last AWV 11/05/19; please schedule at anytime with [facility]-Nurse Health Advisor at [facility].  This should be a 45 minute visit.   

## 2020-11-13 DIAGNOSIS — M47816 Spondylosis without myelopathy or radiculopathy, lumbar region: Secondary | ICD-10-CM | POA: Diagnosis not present

## 2020-11-15 LAB — HEPATITIS B DNA, ULTRAQUANTITATIVE, PCR
Hepatitis B DNA (Calc): 3.31 Log IU/mL — ABNORMAL HIGH
Hepatitis B DNA: 2020 IU/mL — ABNORMAL HIGH

## 2020-11-16 ENCOUNTER — Encounter: Payer: Self-pay | Admitting: Family Medicine

## 2021-01-14 ENCOUNTER — Other Ambulatory Visit: Payer: Self-pay | Admitting: Urology

## 2021-01-14 DIAGNOSIS — C61 Malignant neoplasm of prostate: Secondary | ICD-10-CM

## 2021-01-22 DIAGNOSIS — M7632 Iliotibial band syndrome, left leg: Secondary | ICD-10-CM | POA: Diagnosis not present

## 2021-02-01 ENCOUNTER — Other Ambulatory Visit: Payer: Self-pay

## 2021-02-01 ENCOUNTER — Ambulatory Visit
Admission: RE | Admit: 2021-02-01 | Discharge: 2021-02-01 | Disposition: A | Discharge status: Home / Self Care | Payer: Medicare HMO | Source: Ambulatory Visit | Attending: Urology | Admitting: Urology

## 2021-02-01 DIAGNOSIS — C61 Malignant neoplasm of prostate: Secondary | ICD-10-CM

## 2021-02-01 DIAGNOSIS — N3289 Other specified disorders of bladder: Secondary | ICD-10-CM | POA: Diagnosis not present

## 2021-02-01 DIAGNOSIS — N400 Enlarged prostate without lower urinary tract symptoms: Secondary | ICD-10-CM | POA: Diagnosis not present

## 2021-02-01 IMAGING — MR MR PROSTATE WO/W CM
12 series · 48 of 48 positions shown · IV contrast (15 ml multihance)
Comparison: [DATE]

CLINICAL DATA: Prostate carcinoma, Gleason score 6. Active
surveillance.

EXAM:
MR PROSTATE WITHOUT AND WITH CONTRAST
TECHNIQUE: Multiplanar multisequence MRI images were obtained of the pelvis
centered about the prostate. Pre and post contrast images were
obtained.
CONTRAST:  15mL MULTIHANCE GADOBENATE DIMEGLUMINE 529 MG/ML IV SOLN

[Series 4: T2 · coronal · 3.0mm · 0.56mm/px · 1 of 23 slices shown (1 of 3)]
[im 1/23]
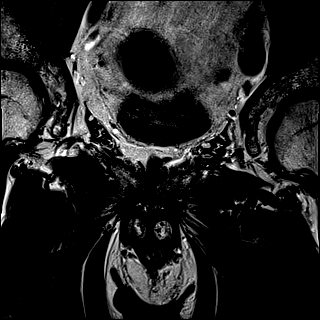

[Series 5: T1 · axial · 5.0mm · 1.25mm/px · z∈[-28,+167]mm · 2 of 80 slices shown]
[im 1/80]
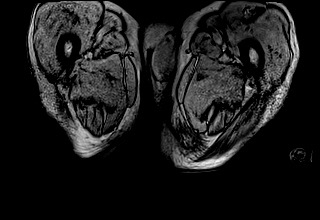
[im 80/80]
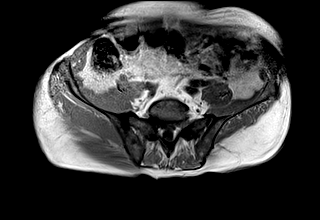

[Series 6: DWI · axial · 3.0mm · 1.75mm/px · z∈[+22,+79]mm · 2 of 60 slices shown (1 of 3)]
[im 1/60]
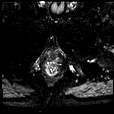
[im 60/60]
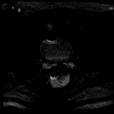

[Series 7: DWI · axial · 3.0mm · 1.75mm/px · 1 of 20 slices shown (2 of 3)]
[im 1/20]
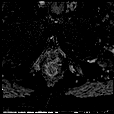

[Series 8: DWI · axial · 3.0mm · 1.75mm/px · 1 of 20 slices shown (3 of 3)]
[im 1/20]
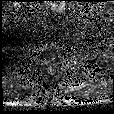

[Series 9: T2 · axial · 3.0mm · 0.56mm/px · 1 of 23 slices shown (2 of 3)]
[im 1/23]
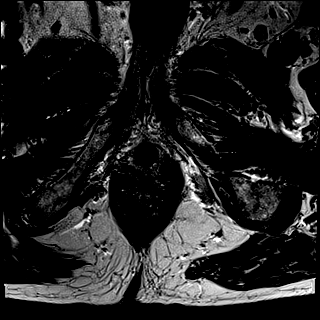

[Series 10: T2 · axial · 1.0mm · 1.04mm/px · z∈[+11,+90]mm · 2 of 80 slices shown (3 of 3)]
[im 1/80]
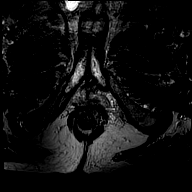
[im 80/80]
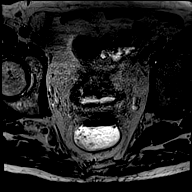

[Series 11: pre t1_twist_tra_dyn · axial · non-contrast · 3.5mm · 0.83mm/px · 1 of 18 slices shown]
[im 1/18]
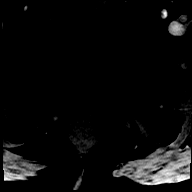

[Series 12: post t1_twist_tra_dyn-copy center · axial · non-contrast · 3.5mm · 0.83mm/px · z∈[+21,+80]mm · 17 of 540 slices shown]
[im 1/540]
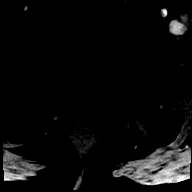
[im 34/540]
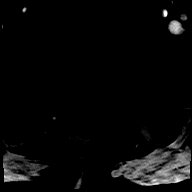
[im 68/540]
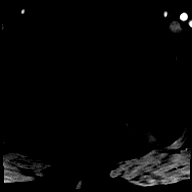
[im 102/540]
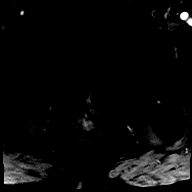
[im 135/540]
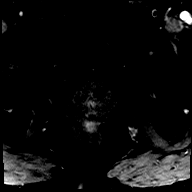
[im 169/540]
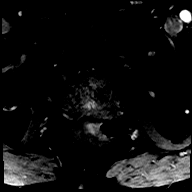
[im 203/540]
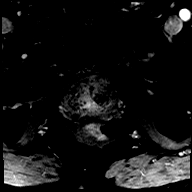
[im 236/540]
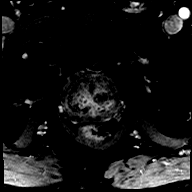
[im 270/540]
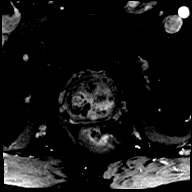
[im 304/540]
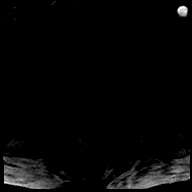
[im 337/540]
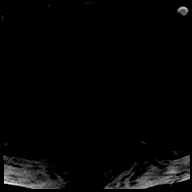
[im 371/540]
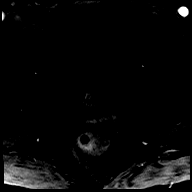
[im 405/540]
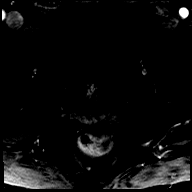
[im 438/540]
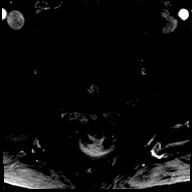
[im 472/540]
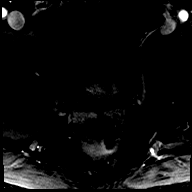
[im 506/540]
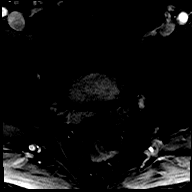
[im 540/540]
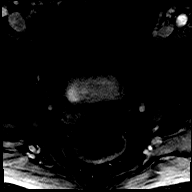

[Series 13: post t1_twist_tra_dyn-copy cent_sub · axial · 3.5mm · 0.83mm/px · z∈[+21,+80]mm · 16 of 522 slices shown]
[im 1/522]
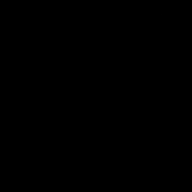
[im 35/522]
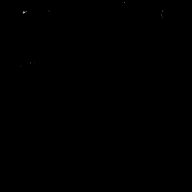
[im 70/522]
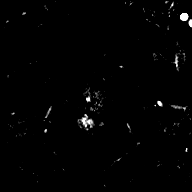
[im 105/522]
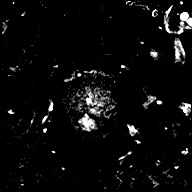
[im 139/522]
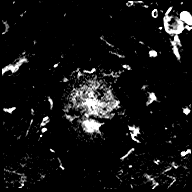
[im 174/522]
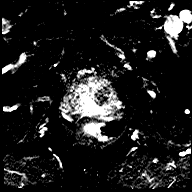
[im 209/522]
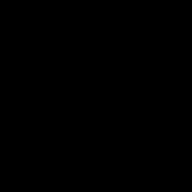
[im 244/522]
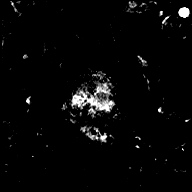
[im 278/522]
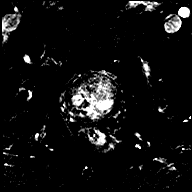
[im 313/522]
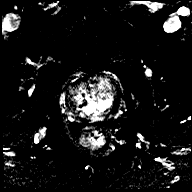
[im 348/522]
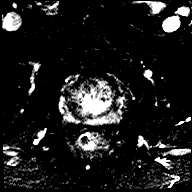
[im 383/522]
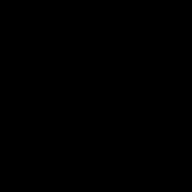
[im 417/522]
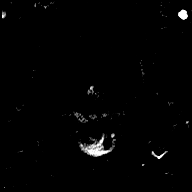
[im 452/522]
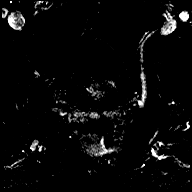
[im 487/522]
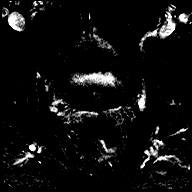
[im 522/522]
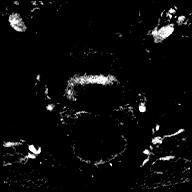

[Series 14: t1_vibe_dixon_tra_f · axial · 2.5mm · 0.91mm/px · z∈[-30,+168]mm · 2 of 80 slices shown]
[im 1/80]
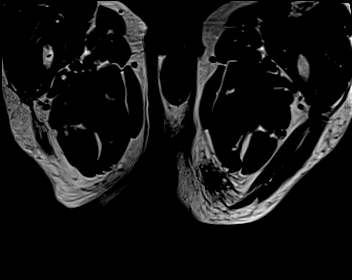
[im 80/80]
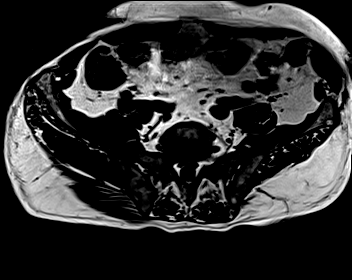

[Series 15: t1_vibe_dixon_tra_w · axial · 2.5mm · 0.91mm/px · z∈[-30,+168]mm · 2 of 80 slices shown]
[im 1/80]
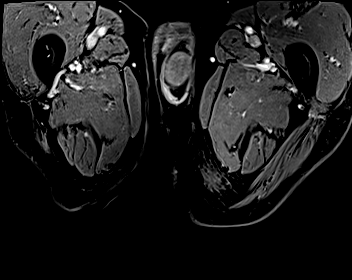
[im 80/80]
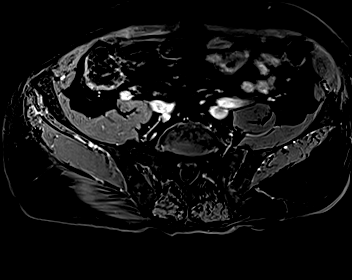

[48 of 48 positions shown; findings below may reference images not displayed]

FINDINGS: Prostate:

-- Peripheral Zone: Linear/wedge shaped hypointensities are noted on
ADC, left side greater than right; however, no focal ADC hypointense
or high b-value DWI hyperintense nodules are identified. Extruded
BPH nodule noted in the right anterior base.

-- Transition/Central Zone: Mildly enlarged with multiple
circumscribed BPH nodules noted; however, no suspicious nodules with
obscured margins or restricted diffusion seen.

-- Measurements/Volume:  5.3 by 4.1 x 4.9 cm

Transcapsular spread:  Absent

Seminal vesicle involvement:  Absent

Neurovascular bundle involvement:  Absent

Pelvic adenopathy: None visualized

Bone metastasis: None visualized

Other: Diffuse bladder wall thickening again seen, consistent with
chronic bladder outlet obstruction.
IMPRESSION: Stable exam. No radiographic evidence of high-grade prostate
carcinoma. PI-RADS 2: Low (clinically significant cancer is unlikely
to be present)

## 2021-02-01 MED ORDER — GADOBENATE DIMEGLUMINE 529 MG/ML IV SOLN
15.0000 mL | Freq: Once | INTRAVENOUS | Status: AC | PRN
  Administered 2021-02-01: 15 mL via INTRAVENOUS

## 2021-02-04 ENCOUNTER — Encounter (HOSPITAL_BASED_OUTPATIENT_CLINIC_OR_DEPARTMENT_OTHER): Payer: Self-pay | Admitting: Physical Therapy

## 2021-02-04 ENCOUNTER — Other Ambulatory Visit: Payer: Self-pay

## 2021-02-04 ENCOUNTER — Ambulatory Visit (HOSPITAL_BASED_OUTPATIENT_CLINIC_OR_DEPARTMENT_OTHER): Payer: Medicare HMO | Attending: Neurosurgery | Admitting: Physical Therapy

## 2021-02-04 DIAGNOSIS — M62830 Muscle spasm of back: Secondary | ICD-10-CM | POA: Diagnosis not present

## 2021-02-04 DIAGNOSIS — G8929 Other chronic pain: Secondary | ICD-10-CM | POA: Insufficient documentation

## 2021-02-04 DIAGNOSIS — M545 Low back pain, unspecified: Secondary | ICD-10-CM | POA: Diagnosis not present

## 2021-02-04 NOTE — Patient Instructions (Signed)
Access Code: [email or web address] Prepared by: Carolyne Littles Exercises Supine Lower Trunk Rotation - 1 x daily - 7 x weekly - 3 sets - 10 reps Supine Piriformis Stretch with Foot on Ground - 1 x daily - 7 x weekly - 3 sets - 3 reps - 20 hold Standing Glute Med Mobilization with Small Ball on Wall - 1 x daily - 7 x weekly - 3 sets - 10 reps

## 2021-02-05 NOTE — Therapy (Signed)
[address] [place], [place], [address] Phone: [phone]   Fax:  [phone]  Physical Therapy Evaluation  Patient Details  Name: Anthony Mann MRN: [id number] Date of Birth: 06/13/1946 Referring Provider (PT): Simeon Craft PA   Encounter Date: 02/04/2021   PT End of Session - 02/04/21 1649    Visit Number 1    Number of Visits 12    Date for PT Re-Evaluation 03/18/21    Authorization Type [employer] (no [employer])    PT Start Time 0845    PT Stop Time 0927    PT Time Calculation (min) 42 min    Activity Tolerance Patient tolerated treatment well    Behavior During Therapy [facility] for tasks assessed/performed           Past Medical History:  Diagnosis Date  . Cataract    right eye in [place], left here 10/2013  . History of hepatitis B   . Hyperlipidemia   . Hypertension   . Prostate cancer ([place]) 2018    Past Surgical History:  Procedure Laterality Date  . arm trauma     he had an industrial accident in 1991   . COLONOSCOPY  03/24/2016  . EYE SURGERY     right eye cataract  in [place]  last July 2020   negative results per pt  . SKIN GRAFT FULL THICKNESS ARM     right arm  . ULNAR NERVE TRANSPOSITION     in 1991    There were no vitals filed for this visit.    Subjective Assessment - 02/04/21 1644    Subjective Patient reports that over the past few weeks he has had significant difficulty sitting. he has significant pain that radiates into his gluteal and down towards his left torachneter. He has been unable to sit down and eat. he can lie flat on his back on the floor to releive the pain. It is making it difficult for him to go anywhere 2nd to difficulty sitting.    Pertinent History hep B. prostate cancer    How long can you sit comfortably? progressive increase in pain in sitting position    How long can you stand comfortably? Has to stand    How long can you walk comfortably? can  have increased pain standing    Diagnostic tests Per patient MRI shows degeenration    Patient Stated Goals to be able to sit    Currently in Pain? Yes    Pain Score 8     Pain Location Back    Pain Orientation Left    Pain Descriptors / Indicators Aching    Pain Type Chronic pain    Pain Radiating Towards into right buttock    Pain Onset More than a month ago    Pain Frequency Constant    Aggravating Factors  sitting down    Pain Relieving Factors lies on his back flat    Effect of Pain on Daily Activities difficulty perfroming ADL's              [facility] PT Assessment - 02/05/21 0001      Assessment   Medical Diagnosis Lumbar radiculopathty    Referring Provider (PT) Simeon Craft PA    Onset Date/Surgical Date --   5 weeks prior   Hand Dominance Right    Next MD Visit Nothing scheduled    Prior Therapy none      Precautions  Precautions None      Restrictions   Weight Bearing Restrictions No      Balance Screen   Has the patient fallen in the past 6 months No    Has the patient had a decrease in activity level because of a fear of falling?  No    Is the patient reluctant to leave their home because of a fear of falling?  No      Home Environment   Additional Comments lives with wife      Prior Function   Level of Independence Independent    Vocation Retired      Associate Professor   Overall Cognitive Status Within Functional Limits for tasks assessed    Attention Focused    Focused Attention Appears intact    Memory Appears intact    Awareness Appears intact    Problem Solving Appears intact      Observation/Other Assessments   Observations stood during evaluation      Sensation   Light Touch Appears Intact    Additional Comments pain radiating into the left buttock      Coordination   Gross Motor Movements are Fluid and Coordinated Yes    Fine Motor Movements are Fluid and Coordinated Yes      Posture/Postural Control   Posture Comments stands in good  posture. Patient unable to sit for any period of time to observe posture      AROM   Lumbar Flexion limited 75% with pain    Lumbar Extension not tested    Lumbar - Right Rotation painful limited 50%    Lumbar - Left Rotation painful limited 50%      PROM   Overall PROM Comments after manual therapy had WFL of left hip      Strength   Overall Strength Comments patient unable to sit for MMT      Palpation   Palpation comment spasming in the left gluteal and up into the left lumbar spine      Bed Mobility   Bed Mobility Not assessed   moving supine to sit patient required assitance   Supine to Sit Other (comment)      Transfers   Comments after lying down for several minutes patient was unable to come to a standing position. He had to gget down on his knees off the table then lie down flat on the floor after a minute he reported the pain had subsided and he was able to get back up.      Ambulation/Gait   Gait Comments decreased hip rotation and hip flexion                      Objective measurements completed on examination: See above findings.       [facility] Adult PT Treatment/Exercise - 02/05/21 0001      Lumbar Exercises: Stretches   Lower Trunk Rotation Limitations x15 to each side    Piriformis Stretch Limitations 3x20 sec hold    Other Lumbar Stretch Exercise tennis ball trigger point release      Manual Therapy   Manual Therapy Soft tissue mobilization;Joint mobilization    Manual therapy comments trigger point release to lower lumbar spine    Joint Mobilization LAD grade I and II to left LE to reduce compression and inflamation                  PT Education - 02/04/21 1648    Education Details  decreasing muscle spasming; HEP, symptom mangement    Person(s) Educated Patient    Methods Explanation;Tactile cues;Demonstration;Verbal cues    Comprehension Verbalized understanding;Returned demonstration;Verbal cues required;Tactile cues required             PT Short Term Goals - 02/04/21 1021      PT SHORT TERM GOAL #1   Title Patient will increase lumbar flexion by 50%    Time 3    Period Weeks    Status New    Target Date 02/25/21      PT SHORT TERM GOAL #2   Title Patient will increase gross right Le strength to 5/5    Time 3    Period Weeks    Status New    Target Date 02/25/21      PT SHORT TERM GOAL #3   Title Patient will sit for 20 minutes with < 4/10 pain    Time 3    Period Weeks    Status New    Target Date 02/25/21             PT Long Term Goals - 02/04/21 1651      PT LONG TERM GOAL #1   Title Patient will sit for 1 hour without report of increased pain    Time 6    Period Weeks    Status New    Target Date 03/18/21      PT LONG TERM GOAL #2   Title Patient will be independent with complete HEP for stretching and strengthening    Time 6    Period Weeks    Status New    Target Date 03/18/21                  Plan - 02/04/21 0939    Clinical Impression Statement Patient presents with significant pain in his upper gluteal that radiates down into his buttock. He has significant pain sitting. he has been standing most of the time during the day or lying flat. He has spasming in his gluteal and his lower lumbar spine. He has limited motion with all lumbar movements. He has weakness in hi left leg. He can lie flat on the floor and relive his pain. He would benefit from skilled therapy to reduce spasming and improve patients ability to tolerate sitting position.    Personal Factors and Comorbidities Comorbidity 1    Comorbidities history of prostate cancer; cervical spine suregry with releated decreased use of left UE    Examination-Activity Limitations Locomotion Level;Sit;Bed Mobility;Caring for Others    Examination-Participation Restrictions Cleaning    Stability/Clinical Decision Making Evolving/Moderate complexity    Clinical Decision Making Moderate    Rehab Potential Excellent     PT Frequency 2x / week    PT Duration 6 weeks    PT Treatment/Interventions ADLs/Self Care Home Management;Electrical Stimulation;Iontophoresis 4mg /ml Dexamethasone;Cryotherapy;DME Instruction;Functional mobility training;Therapeutic activities;Therapeutic exercise;Neuromuscular re-education;Patient/family education;Manual techniques;Passive range of motion;Dry needling;Taping    PT Next Visit Plan assess tolerance to manual therapy and needling; continue with manual therapy. Add light ore exercises and exercises that promote ability to sit.    PT Home Exercise Plan Access Code: [email or web address]  URL: [email or web address]  Date: 02/04/2021  Prepared by: Carolyne Littles    Exercises  Supine Lower Trunk Rotation - 1 x daily - 7 x weekly - 3 sets - 10 reps  Supine Piriformis Stretch with Foot on Ground - 1 x daily - 7 x weekly - 3 sets - 3 reps -  14 hold  Standing Glute Med Mobilization with Small Ball on Wall - 1 x daily - 7 x weekly - 3 sets - 10 reps    Consulted and Agree with Plan of Care Patient           Patient will benefit from skilled therapeutic intervention in order to improve the following deficits and impairments:  Abnormal gait,Difficulty walking,Pain,Increased muscle spasms  Visit Diagnosis: Chronic bilateral low back pain without sciatica  Muscle spasm of back     Problem List Patient Active Problem List   Diagnosis Date Noted  . Atypical chest pain 12/10/2019  . Low back pain 12/04/2019  . Laryngopharyngeal reflux (LPR) 10/30/2019  . History of adenomatous polyp of colon 05/11/2019  . Osteoarthritis of right knee 11/29/2017  . Prostate cancer ([place]) 08/03/2017  . Depressed mood 06/27/2017  . BPH associated with nocturia 01/26/2016  . Hyperlipidemia 07/16/2014  . Hepatitis B   . HTN (hypertension) 10/14/2010    Carney Living PT DPT  02/05/2021, 2:00 PM  [facility] [place], [place],  [address] Phone: [phone]   Fax:  [phone]  Name: Anthony Mann MRN: [id number] Date of Birth: [birth year]

## 2021-02-06 ENCOUNTER — Other Ambulatory Visit: Payer: Self-pay

## 2021-02-06 ENCOUNTER — Encounter (HOSPITAL_BASED_OUTPATIENT_CLINIC_OR_DEPARTMENT_OTHER): Payer: Self-pay | Admitting: *Deleted

## 2021-02-06 ENCOUNTER — Encounter (HOSPITAL_BASED_OUTPATIENT_CLINIC_OR_DEPARTMENT_OTHER): Payer: Self-pay

## 2021-02-06 ENCOUNTER — Emergency Department (HOSPITAL_BASED_OUTPATIENT_CLINIC_OR_DEPARTMENT_OTHER): Payer: Medicare HMO

## 2021-02-06 ENCOUNTER — Emergency Department (HOSPITAL_BASED_OUTPATIENT_CLINIC_OR_DEPARTMENT_OTHER)
Admission: EM | Admit: 2021-02-06 | Discharge: 2021-02-06 | Disposition: A | Discharge status: Home / Self Care | Payer: Medicare HMO | Attending: Emergency Medicine | Admitting: Emergency Medicine

## 2021-02-06 ENCOUNTER — Ambulatory Visit (HOSPITAL_BASED_OUTPATIENT_CLINIC_OR_DEPARTMENT_OTHER): Payer: Medicare HMO | Admitting: Physical Therapy

## 2021-02-06 DIAGNOSIS — M25552 Pain in left hip: Secondary | ICD-10-CM | POA: Diagnosis not present

## 2021-02-06 DIAGNOSIS — Z8546 Personal history of malignant neoplasm of prostate: Secondary | ICD-10-CM | POA: Diagnosis not present

## 2021-02-06 DIAGNOSIS — M62830 Muscle spasm of back: Secondary | ICD-10-CM

## 2021-02-06 DIAGNOSIS — Z87891 Personal history of nicotine dependence: Secondary | ICD-10-CM | POA: Insufficient documentation

## 2021-02-06 DIAGNOSIS — Z7982 Long term (current) use of aspirin: Secondary | ICD-10-CM | POA: Diagnosis not present

## 2021-02-06 DIAGNOSIS — Z79899 Other long term (current) drug therapy: Secondary | ICD-10-CM | POA: Diagnosis not present

## 2021-02-06 DIAGNOSIS — I10 Essential (primary) hypertension: Secondary | ICD-10-CM | POA: Insufficient documentation

## 2021-02-06 DIAGNOSIS — M5442 Lumbago with sciatica, left side: Secondary | ICD-10-CM | POA: Insufficient documentation

## 2021-02-06 DIAGNOSIS — M25559 Pain in unspecified hip: Secondary | ICD-10-CM

## 2021-02-06 DIAGNOSIS — M545 Low back pain, unspecified: Secondary | ICD-10-CM

## 2021-02-06 IMAGING — DX DG HIP (WITH OR WITHOUT PELVIS) 2-3V*L*
2 series · 3 of 3 positions shown · non-contrast
Comparison: Pelvic MRI dated [DATE].

CLINICAL DATA: 74-year-old male with left hip pain. History of
prostate cancer.

EXAM:
DG HIP (WITH OR WITHOUT PELVIS) 2-3V LEFT

[Series 1: pelvis · 0.14mm/px · 2 of 2 slices shown]
[im 1/2]
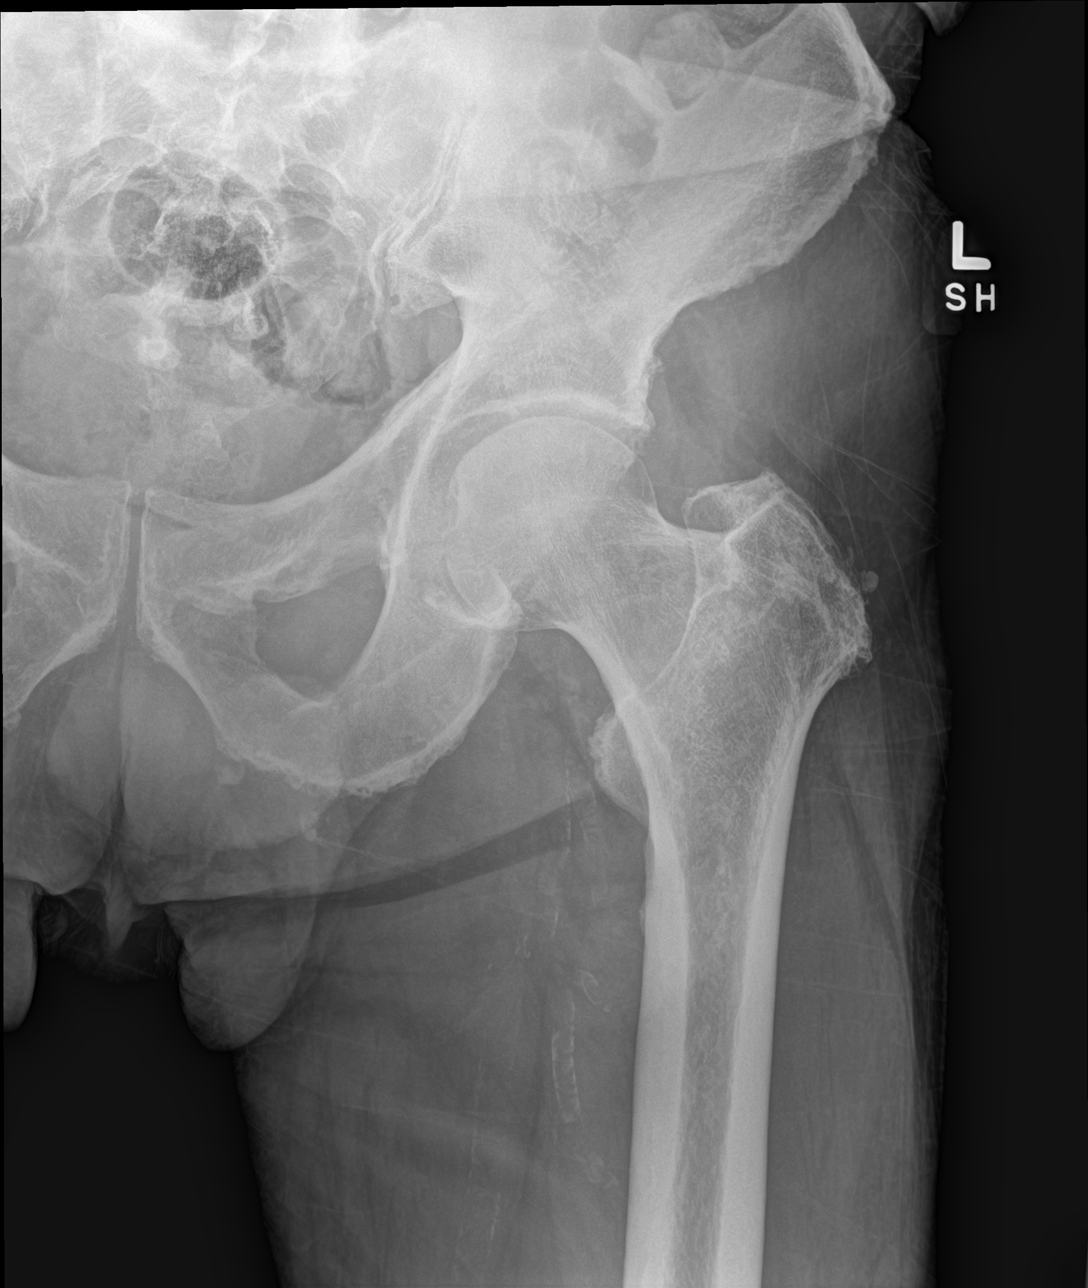
[im 2/2]
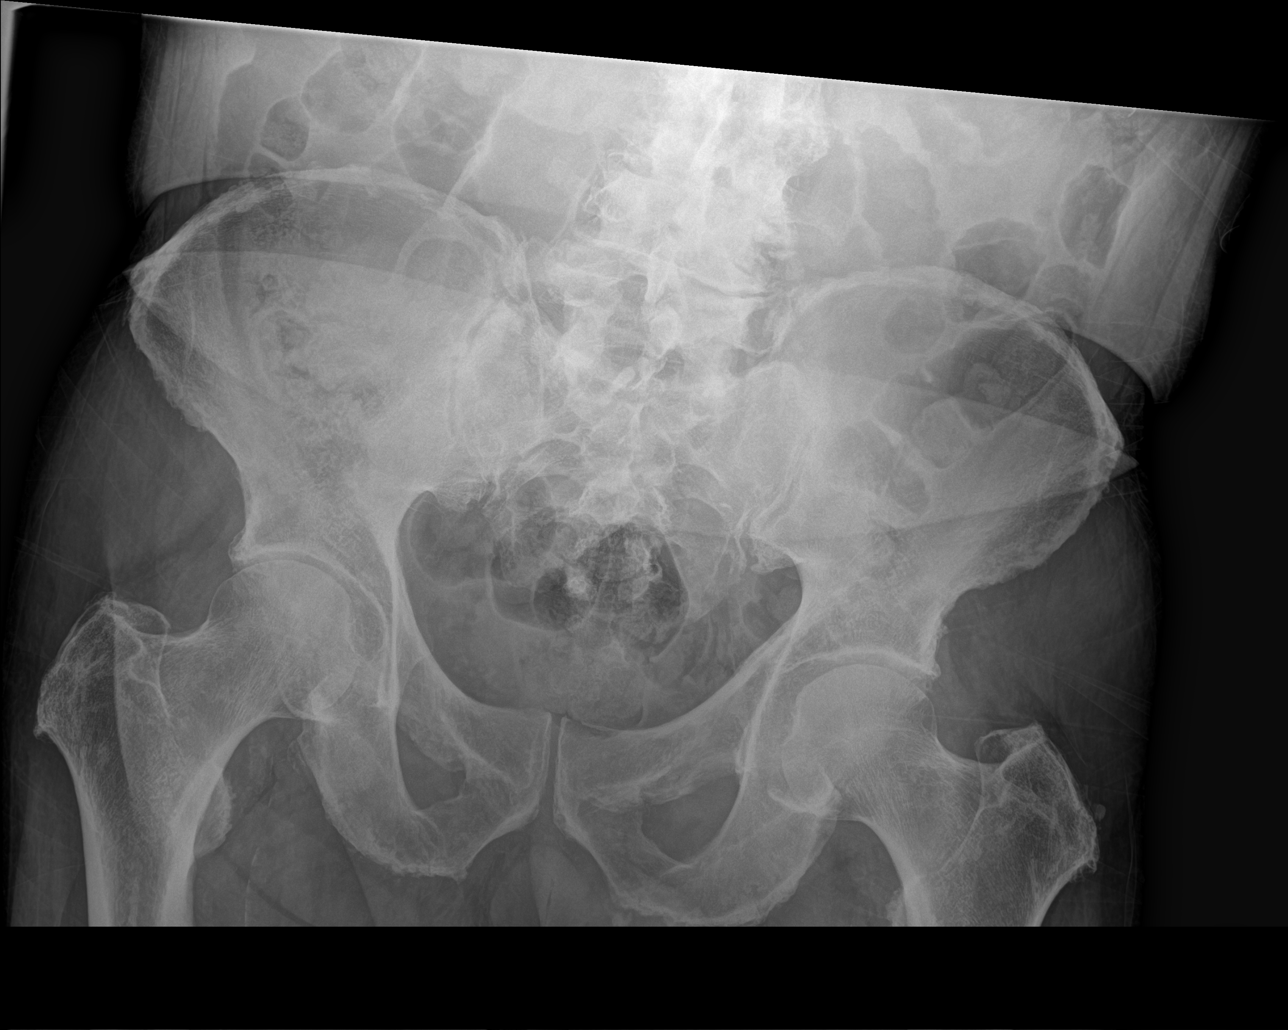

[hip]
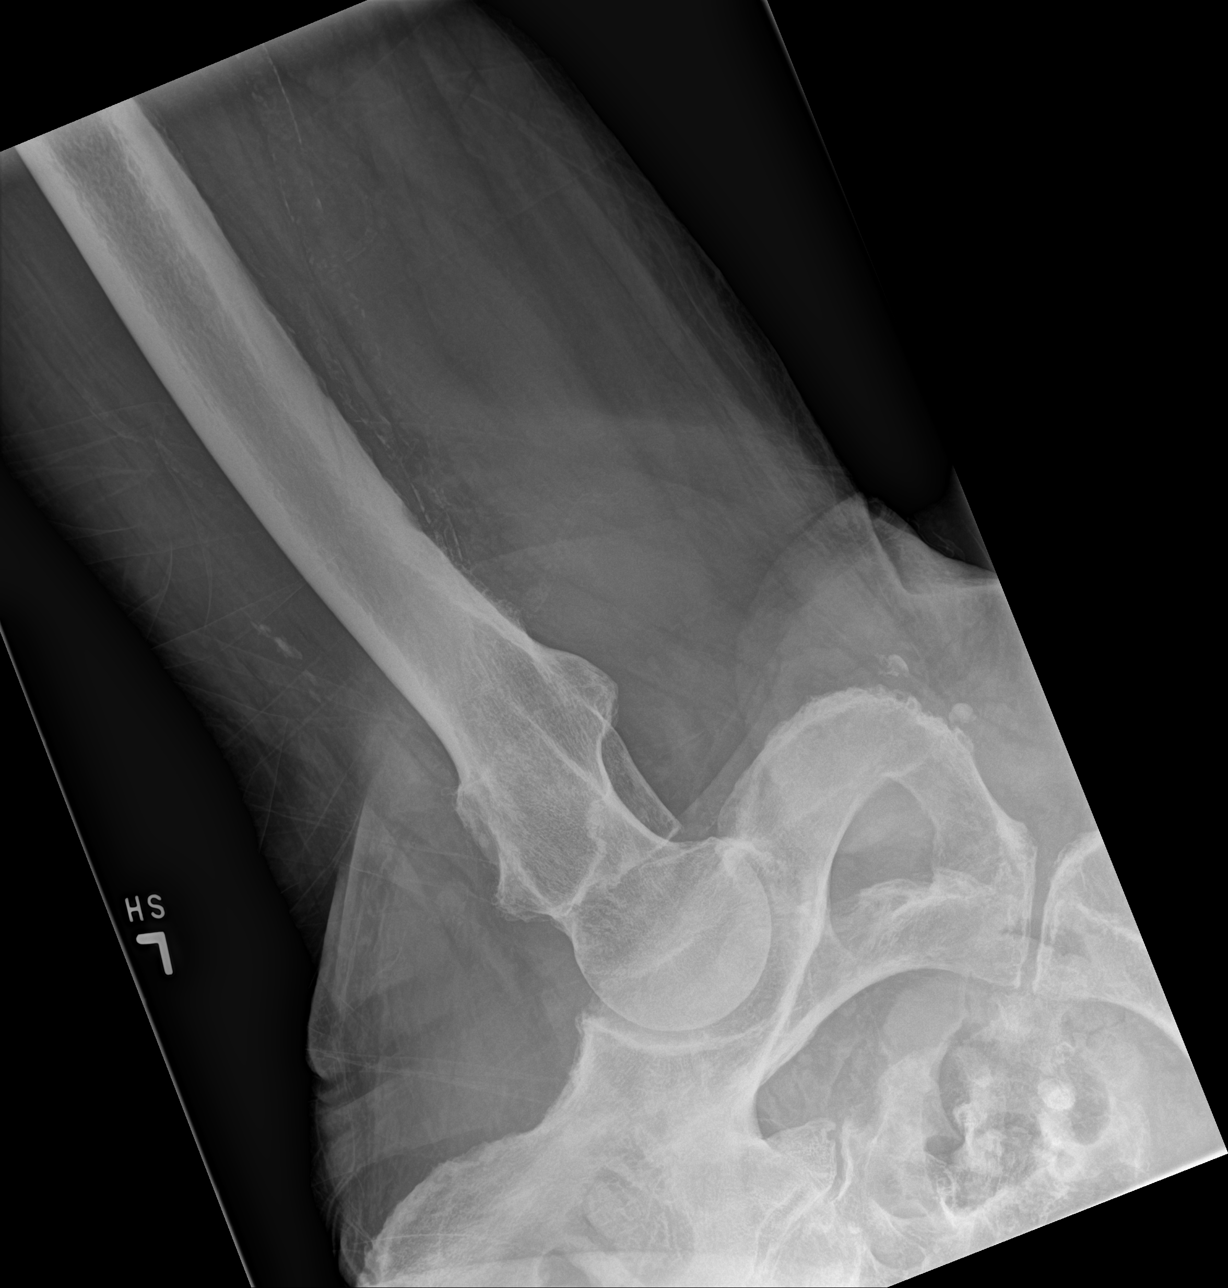

[3 of 3 positions shown; findings below may reference images not displayed]

FINDINGS: There is no acute fracture or dislocation. Mild bilateral hip
arthritic changes. There is degenerative changes of the lower lumbar
spine. The soft tissues are unremarkable. Vascular calcifications
noted.
IMPRESSION: No acute fracture or dislocation.

## 2021-02-06 MED ORDER — ONDANSETRON HCL 4 MG/2ML IJ SOLN
4.0000 mg | Freq: Once | INTRAMUSCULAR | Status: AC
  Administered 2021-02-06: 4 mg via INTRAVENOUS
  Filled 2021-02-06: qty 2

## 2021-02-06 MED ORDER — DIAZEPAM 5 MG PO TABS: 5.0000 mg | ORAL_TABLET | Freq: Two times a day (BID) | ORAL | 0 refills | Status: DC

## 2021-02-06 MED ORDER — DIAZEPAM 5 MG PO TABS
5.0000 mg | ORAL_TABLET | Freq: Once | ORAL | Status: AC
  Administered 2021-02-06: 5 mg via ORAL
  Filled 2021-02-06: qty 1

## 2021-02-06 MED ORDER — HYDROMORPHONE HCL 1 MG/ML IJ SOLN
0.5000 mg | Freq: Once | INTRAMUSCULAR | Status: AC
Start: 2021-02-06 — End: 2021-02-06
  Administered 2021-02-06: 0.5 mg via INTRAVENOUS
  Filled 2021-02-06: qty 1

## 2021-02-06 MED ORDER — LORAZEPAM 2 MG/ML IJ SOLN
0.5000 mg | Freq: Once | INTRAMUSCULAR | Status: AC
  Administered 2021-02-06: 0.5 mg via INTRAVENOUS
  Filled 2021-02-06: qty 1

## 2021-02-06 NOTE — Discharge Instructions (Addendum)
Take your pain medication that she have at home.  New prescription provided for Valium to help with the muscle spasms.  Make an appointment to follow-up with sports medicine Dr. Raeford Razor.  Or either follow back up with your providers.  Return for any new or worse symptoms.  X-ray of the left hip and the pelvis without any acute bony abnormalities.

## 2021-02-06 NOTE — ED Notes (Signed)
States been having lower back pain shooting down left leg.  Onset 5 weeks.  Going to physical therapy but states today pain worse and unable to sit down or lay down.

## 2021-02-06 NOTE — ED Notes (Signed)
States medication has helped  Having more movement in legs and back

## 2021-02-06 NOTE — ED Triage Notes (Signed)
Lower back pain and a left leg spasm for 6 weeks when I bend or seat.

## 2021-02-06 NOTE — ED Provider Notes (Signed)
[facility] EMERGENCY DEPT Provider Note   CSN: [id number] Arrival date & time: 02/06/21  1157     History Chief Complaint  Patient presents with  . Back Pain  . Leg Pain    Anthony Mann is a 75 y.o. male.  Patient is followed by outpatient rehab here.  For chronic bilateral low back pain with sciatica.  Patient states he has Percocet pain medicine at home.  Patient states that something different is going on.  He is not having low back pain.  He is having pain in his left hip that kind of radiates into the calf area.  No swelling to the ankle or foot.  Patient states that this is been ongoing for weeks but is been much worse today.  He went to rehab could not even do it because he had spasmed up so bad he collapsed on the floor.  Patient's been going to physical therapy felt that it was helping some.  Patient thought that he could get an injection steroid injection.  He is also followed by Dr. Arnoldo Morale from neurosurgery.  Has had surgery on his neck before.  They referred him to physical therapy for back pain.  Patient with visible spasm in the left thigh area.  Patient denies any numbness or weakness to the foot.  No pain in the foot no swelling in the ankle.  No particular problem with the right lower extremity.        Past Medical History:  Diagnosis Date  . Cataract    right eye in [place], left here 10/2013  . History of hepatitis B   . Hyperlipidemia   . Hypertension   . Prostate cancer [facility]) 2018    Patient Active Problem List   Diagnosis Date Noted  . Atypical chest pain 12/10/2019  . Low back pain 12/04/2019  . Laryngopharyngeal reflux (LPR) 10/30/2019  . History of adenomatous polyp of colon 05/11/2019  . Osteoarthritis of right knee 11/29/2017  . Prostate cancer ([place]) 08/03/2017  . Depressed mood 06/27/2017  . BPH associated with nocturia 01/26/2016  . Hyperlipidemia 07/16/2014  . Hepatitis B   . HTN (hypertension) 10/14/2010    Past Surgical  History:  Procedure Laterality Date  . arm trauma     he had an industrial accident in 1991   . COLONOSCOPY  03/24/2016  . EYE SURGERY     right eye cataract  in [place]  last July 2020   negative results per pt  . SKIN GRAFT FULL THICKNESS ARM     right arm  . ULNAR NERVE TRANSPOSITION     in 1991       Family History  Problem Relation Age of Onset  . Cancer Father        colon  . Heart disease Father 68  . Kidney disease Father        nephectomy  . Colon cancer Father 40  . Diabetes Mother 61  . Colon polyps Neg Hx   . Esophageal cancer Neg Hx   . Rectal cancer Neg Hx   . Stomach cancer Neg Hx     Social History   Tobacco Use  . Smoking status: Former Smoker    Packs/day: 0.50    Years: 9.00    Pack years: 4.50    Types: Cigarettes    Quit date: 06/13/1989    Years since quitting: 31.6  . Smokeless tobacco: Never Used  Vaping Use  . Vaping Use:  Never used  Substance Use Topics  . Alcohol use: Yes    Alcohol/week: 3.0 standard drinks    Types: 2 Cans of beer, 1 Shots of liquor per week    Comment: occasional beer  . Drug use: No    Home Medications Prior to Admission medications   Medication Sig Start Date End Date Taking? Authorizing Provider  diazepam (VALIUM) 5 MG tablet Take 1 tablet (5 mg total) by mouth 2 (two) times daily. 02/06/21  Yes Fredia Sorrow, MD  amLODipine (NORVASC) 5 MG tablet Take 1 tablet (5 mg total) by mouth daily. 05/09/20   Marin Olp, MD  aspirin EC 81 MG tablet Take 81 mg by mouth daily.    [provider]  cyclobenzaprine (FLEXERIL) 10 MG tablet Take 10 mg by mouth 3 (three) times daily as needed. 03/11/20   [provider]  hydrochlorothiazide (HYDRODIURIL) 25 MG tablet Take 1 tablet (25 mg total) by mouth daily. 05/09/20   Marin Olp, MD  irbesartan (AVAPRO) 300 MG tablet Take 1 tablet (300 mg total) by mouth daily. 05/09/20   Marin Olp, MD  methocarbamol (ROBAXIN) 500 MG  tablet Take 1 tablet (500 mg total) by mouth 2 (two) times daily. 12/10/19   Fransico Meadow, PA-C  rosuvastatin (CRESTOR) 10 MG tablet TAKE 1 TABLET BY MOUTH EVERY DAY 11/10/20   Marin Olp, MD    Allergies    Patient has no known allergies.  Review of Systems   Review of Systems  Constitutional: Negative for chills and fever.  HENT: Negative for rhinorrhea and sore throat.   Eyes: Negative for visual disturbance.  Respiratory: Negative for cough and shortness of breath.   Cardiovascular: Negative for chest pain and leg swelling.  Gastrointestinal: Negative for abdominal pain, diarrhea, nausea and vomiting.  Genitourinary: Negative for dysuria.  Musculoskeletal: Negative for back pain and neck pain.  Skin: Negative for rash.  Neurological: Negative for dizziness, weakness, light-headedness, numbness and headaches.  Hematological: Does not bruise/bleed easily.  Psychiatric/Behavioral: Negative for confusion.    Physical Exam Updated Vital Signs BP (!) 145/81 (BP Location: Right Arm)   Pulse 84   Temp 98.4 F (36.9 C)   Resp 14   Ht 1.651 m (5\' 5" )   Wt 76.7 kg   SpO2 98%   BMI 28.12 kg/m   Physical Exam Vitals and nursing note reviewed.  Constitutional:      Appearance: He is well-developed.  HENT:     Head: Normocephalic and atraumatic.  Eyes:     Extraocular Movements: Extraocular movements intact.     Conjunctiva/sclera: Conjunctivae normal.     Pupils: Pupils are equal, round, and reactive to light.  Cardiovascular:     Rate and Rhythm: Normal rate and regular rhythm.     Heart sounds: No murmur heard.   Pulmonary:     Effort: Pulmonary effort is normal. No respiratory distress.     Breath sounds: Normal breath sounds.  Abdominal:     Palpations: Abdomen is soft.     Tenderness: There is no abdominal tenderness.  Musculoskeletal:        General: No swelling or deformity.     Cervical back: Neck supple.     Comments: Patient with some muscle spasm  in the left thigh area.  That is intermittent.  Distally dorsalis pedis pulses 2+.  Good strength to the toes foot and ankle.  There is no swelling at the ankle.  No erythema.  No skin lesions.  Some pain and discomfort with range of motion of the left hip area.  Back is nontender to palpation.  Skin:    General: Skin is warm and dry.     Capillary Refill: Capillary refill takes 2 to 3 seconds.  Neurological:     General: No focal deficit present.     Mental Status: He is alert and oriented to person, place, and time.     Cranial Nerves: No cranial nerve deficit.     Sensory: No sensory deficit.     ED Results / Procedures / Treatments   Labs (all labs ordered are listed, but only abnormal results are displayed) Labs Reviewed - No data to display  EKG None  Radiology DG Hip Unilat With Pelvis 2-3 Views Left  Result Date: 02/06/2021 CLINICAL DATA:  75 year old male with left hip pain. History of prostate cancer. EXAM: DG HIP (WITH OR WITHOUT PELVIS) 2-3V LEFT COMPARISON:  Pelvic MRI dated 02/01/2021. FINDINGS: There is no acute fracture or dislocation. Mild bilateral hip arthritic changes. There is degenerative changes of the lower lumbar spine. The soft tissues are unremarkable. Vascular calcifications noted. IMPRESSION: No acute fracture or dislocation. Electronically Signed   By: Anner Crete M.D.   On: 02/06/2021 17:56    Procedures Procedures   Medications Ordered in ED Medications  ondansetron (ZOFRAN) injection 4 mg (4 mg Intravenous Given 02/06/21 1804)  HYDROmorphone (DILAUDID) injection 0.5 mg (0.5 mg Intravenous Given 02/06/21 1809)  LORazepam (ATIVAN) injection 0.5 mg (0.5 mg Intravenous Given 02/06/21 1806)  diazepam (VALIUM) tablet 5 mg (5 mg Oral Given 02/06/21 2108)    ED Course  I have reviewed the triage vital signs and the nursing notes.  Pertinent labs & imaging results that were available during my care of the patient were reviewed by me and considered in  my medical decision making (see chart for details).    MDM Rules/Calculators/A&P                           Presentation suggestive of more pain coming from the left hip.  No evidence of any vascular compromise.  No concern for blood clot.  No swelling.  No neurological deficits to the left foot.  Patient improved significantly here with some pain medicine and Ativan.  Muscle spasm has resolved.  We will treat him with a dose of Valium orally here and pick up prescription of Valium to go along with his Percocet that he has at home.  Follow back up either with his neurosurgeon but also gave referral to sports medicine Dr. Rosiland Oz.  Patient overall feels much much better.  X-ray of the left hip and pelvis without any acute abnormalities.  Final Clinical Impression(s) / ED Diagnoses Final diagnoses:  Hip pain    Rx / DC Orders ED Discharge Orders         Ordered    diazepam (VALIUM) 5 MG tablet  2 times daily        02/06/21 2107           Fredia Sorrow, MD 02/06/21 2112
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
## 2021-02-06 NOTE — ED Provider Notes (Signed)
Emergency Medicine Provider Triage Evaluation Note  Anthony Mann , a 75 y.o. male  was evaluated in triage.  Pt complains of exacerbation of his low back pain with some radicular symptoms to the left leg.  Review of Systems  Positive: Back pain Negative: No numbness/weakness  Physical Exam  BP (!) 142/84 (BP Location: Right Arm)   Pulse (!) 109   Temp 98.4 F (36.9 C)   Resp 16   Ht 5\' 5"  (1.651 m)   Wt 76.7 kg   SpO2 99%   BMI 28.12 kg/m  Gen:   Awake, no distress    Resp:  Normal effort   MSK:   Moves extremities without difficulty   Other:     Medical Decision Making  Medically screening exam initiated at 3:17 PM.  Appropriate orders placed.  Anthony Mann was informed that the remainder of the evaluation will be completed by another provider, this initial triage assessment does not replace that evaluation, and the importance of remaining in the ED until their evaluation is complete.      Malvin Johns, MD 02/06/21 [phone]

## 2021-02-12 ENCOUNTER — Ambulatory Visit (HOSPITAL_BASED_OUTPATIENT_CLINIC_OR_DEPARTMENT_OTHER): Payer: Medicare HMO | Admitting: Physical Therapy

## 2021-02-16 DIAGNOSIS — C61 Malignant neoplasm of prostate: Secondary | ICD-10-CM | POA: Diagnosis not present

## 2021-02-16 LAB — PSA: PSA: 16.2

## 2021-02-20 ENCOUNTER — Encounter (HOSPITAL_BASED_OUTPATIENT_CLINIC_OR_DEPARTMENT_OTHER): Payer: Medicare HMO | Admitting: Physical Therapy

## 2021-02-23 DIAGNOSIS — Z8546 Personal history of malignant neoplasm of prostate: Secondary | ICD-10-CM | POA: Diagnosis not present

## 2021-02-23 DIAGNOSIS — R972 Elevated prostate specific antigen [PSA]: Secondary | ICD-10-CM | POA: Diagnosis not present

## 2021-02-25 ENCOUNTER — Encounter: Payer: Self-pay | Admitting: Family Medicine

## 2021-03-03 DIAGNOSIS — M5416 Radiculopathy, lumbar region: Secondary | ICD-10-CM | POA: Diagnosis not present

## 2021-03-05 DIAGNOSIS — M4726 Other spondylosis with radiculopathy, lumbar region: Secondary | ICD-10-CM | POA: Diagnosis not present

## 2021-03-05 DIAGNOSIS — M48061 Spinal stenosis, lumbar region without neurogenic claudication: Secondary | ICD-10-CM | POA: Diagnosis not present

## 2021-03-05 DIAGNOSIS — M5416 Radiculopathy, lumbar region: Secondary | ICD-10-CM | POA: Diagnosis not present

## 2021-03-09 DIAGNOSIS — M5416 Radiculopathy, lumbar region: Secondary | ICD-10-CM | POA: Diagnosis not present

## 2021-03-09 DIAGNOSIS — M48061 Spinal stenosis, lumbar region without neurogenic claudication: Secondary | ICD-10-CM | POA: Diagnosis not present

## 2021-03-09 DIAGNOSIS — Z981 Arthrodesis status: Secondary | ICD-10-CM | POA: Diagnosis not present

## 2021-03-17 DIAGNOSIS — M48062 Spinal stenosis, lumbar region with neurogenic claudication: Secondary | ICD-10-CM | POA: Diagnosis not present

## 2021-03-17 DIAGNOSIS — M5416 Radiculopathy, lumbar region: Secondary | ICD-10-CM | POA: Diagnosis not present

## 2021-03-17 DIAGNOSIS — I10 Essential (primary) hypertension: Secondary | ICD-10-CM | POA: Diagnosis not present

## 2021-03-17 DIAGNOSIS — Z6827 Body mass index (BMI) 27.0-27.9, adult: Secondary | ICD-10-CM | POA: Diagnosis not present

## 2021-03-17 DIAGNOSIS — M4180 Other forms of scoliosis, site unspecified: Secondary | ICD-10-CM | POA: Diagnosis not present

## 2021-03-24 ENCOUNTER — Other Ambulatory Visit: Payer: Self-pay | Admitting: Neurosurgery

## 2021-04-03 ENCOUNTER — Other Ambulatory Visit: Payer: Self-pay | Admitting: Neurosurgery

## 2021-04-10 ENCOUNTER — Encounter: Payer: Self-pay | Admitting: Family Medicine

## 2021-04-10 ENCOUNTER — Telehealth (INDEPENDENT_AMBULATORY_CARE_PROVIDER_SITE_OTHER): Payer: Medicare HMO | Admitting: Family Medicine

## 2021-04-10 ENCOUNTER — Other Ambulatory Visit: Payer: Self-pay

## 2021-04-10 VITALS — BP 113/66 | Temp 97.0°F | Ht 65.0 in | Wt 167.0 lb

## 2021-04-10 DIAGNOSIS — I10 Essential (primary) hypertension: Secondary | ICD-10-CM

## 2021-04-10 DIAGNOSIS — U071 COVID-19: Secondary | ICD-10-CM | POA: Diagnosis not present

## 2021-04-10 DIAGNOSIS — E785 Hyperlipidemia, unspecified: Secondary | ICD-10-CM | POA: Diagnosis not present

## 2021-04-10 MED ORDER — MOLNUPIRAVIR EUA 200MG CAPSULE: 4.0000 | ORAL_CAPSULE | Freq: Two times a day (BID) | ORAL | 0 refills | Status: AC

## 2021-04-10 NOTE — Pre-Procedure Instructions (Signed)
Surgical Instructions    Your procedure is scheduled on Monday 04/20/21.   Report to [facility] Main Entrance "A" at 12:10 P.M., then check in with the Admitting office.  Call this number if you have problems the morning of surgery:  [phone]   If you have any questions prior to your surgery date call [phone]: Open Monday-Friday 8am-4pm    Remember:  Do not eat or drink after midnight the night before your surgery     Take these medicines the morning of surgery with A SIP OF WATER   amLODipine (NORVASC)  finasteride (PROSCAR)   rosuvastatin (CRESTOR)  Take these medicines if needed:   cyclobenzaprine (FLEXERIL)   HYDROcodone-acetaminophen (NORCO/VICODIN)   Please follow your aurgeon's i As of today, STOP taking any Aspirin (unless otherwise instructed by your surgeon) Aleve, Naproxen, Ibuprofen, Motrin, Advil, Goody's, BC's, all herbal medications, fish oil, and all vitamins.                     Do NOT Smoke (Tobacco/Vaping) or drink Alcohol 24 hours prior to your procedure.  If you use a CPAP at night, you may bring all equipment for your overnight stay.   Contacts, glasses, piercing's, hearing aid's, dentures or partials may not be worn into surgery, please bring cases for these belongings.    For patients admitted to the hospital, discharge time will be determined by your treatment team.   Patients discharged the day of surgery will not be allowed to drive home, and someone needs to stay with them for 24 hours.  ONLY 1 SUPPORT PERSON MAY BE PRESENT WHILE YOU ARE IN SURGERY. IF YOU ARE TO BE ADMITTED ONCE YOU ARE IN YOUR ROOM YOU WILL BE ALLOWED TWO (2) VISITORS.  Minor children may have two parents present. Special consideration for safety and communication needs will be reviewed on a case by case basis.   Special instructions:   [place]- Preparing For Surgery  Before surgery, you can play an important role. Because skin is not sterile, your skin needs to be  as free of germs as possible. You can reduce the number of germs on your skin by washing with CHG (chlorahexidine gluconate) Soap before surgery.  CHG is an antiseptic cleaner which kills germs and bonds with the skin to continue killing germs even after washing.    Oral Hygiene is also important to reduce your risk of infection.  Remember - BRUSH YOUR TEETH THE MORNING OF SURGERY WITH YOUR REGULAR TOOTHPASTE  Please do not use if you have an allergy to CHG or antibacterial soaps. If your skin becomes reddened/irritated stop using the CHG.  Do not shave (including legs and underarms) for at least 48 hours prior to first CHG shower. It is OK to shave your face.  Please follow these instructions carefully.   Shower the NIGHT BEFORE SURGERY and the MORNING OF SURGERY  If you chose to wash your hair, wash your hair first as usual with your normal shampoo.  After you shampoo, rinse your hair and body thoroughly to remove the shampoo.  Use CHG Soap as you would any other liquid soap. You can apply CHG directly to the skin and wash gently with a scrungie or a clean washcloth.   Apply the CHG Soap to your body ONLY FROM THE NECK DOWN.  Do not use on open wounds or open sores. Avoid contact with your eyes, ears, mouth and genitals (private parts). Wash Face and genitals (private parts)  with your normal soap.   Wash thoroughly, paying special attention to the area where your surgery will be performed.  Thoroughly rinse your body with warm water from the neck down.  DO NOT shower/wash with your normal soap after using and rinsing off the CHG Soap.  Pat yourself dry with a CLEAN TOWEL.  Wear CLEAN PAJAMAS to bed the night before surgery  Place CLEAN SHEETS on your bed the night before your surgery  DO NOT SLEEP WITH PETS.   Day of Surgery: Shower with CHG soap. Do not wear jewelry, make up, nail polish, gel polish, artificial nails, or any other type of covering on natural nails including  finger and toenails. If patients have artificial nails, gel coating, etc. that need to be removed by a nail salon please have this removed prior to surgery. Surgery may need to be canceled/delayed if the surgeon/ anesthesia feels like the patient is unable to be adequately monitored. Do not wear lotions, powders, perfumes/colognes, or deodorant. Do not shave 48 hours prior to surgery.  Men may shave face and neck. Do not bring valuables to the hospital. [facility] is not responsible for any belongings or valuables. Wear Clean/Comfortable clothing the morning of surgery Remember to brush your teeth WITH YOUR REGULAR TOOTHPASTE.   Please read over the following fact sheets that you were given.
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
## 2021-04-12 ENCOUNTER — Encounter: Payer: Self-pay | Admitting: Family Medicine

## 2021-04-13 ENCOUNTER — Inpatient Hospital Stay (HOSPITAL_COMMUNITY)
Admission: RE | Admit: 2021-04-13 | Discharge: 2021-04-13 | Disposition: A | Discharge status: Home / Self Care | Payer: Medicare HMO | Source: Ambulatory Visit

## 2021-04-20 ENCOUNTER — Ambulatory Visit (HOSPITAL_COMMUNITY): Admission: RE | Admit: 2021-04-20 | Payer: Medicare HMO | Source: Home / Self Care | Admitting: Neurosurgery

## 2021-04-20 ENCOUNTER — Encounter (HOSPITAL_COMMUNITY): Admission: RE | Payer: Self-pay | Source: Home / Self Care

## 2021-04-20 SURGERY — LUMBAR LAMINECTOMY/DECOMPRESSION MICRODISCECTOMY 3 LEVELS
Anesthesia: General | Laterality: Bilateral

## 2021-04-22 ENCOUNTER — Telehealth: Payer: Self-pay | Admitting: Family Medicine

## 2021-04-22 NOTE — Telephone Encounter (Signed)
Spoke with spouse she stated she will give patient the message about his AWV

## 2021-04-28 ENCOUNTER — Other Ambulatory Visit: Payer: Self-pay | Admitting: Neurosurgery

## 2021-04-28 ENCOUNTER — Encounter (HOSPITAL_COMMUNITY): Payer: Self-pay | Admitting: Neurosurgery

## 2021-04-28 NOTE — Progress Notes (Addendum)
Spoke with pt for pre-op call. Pt has hx of HTN. Denies cardiac history or Diabetes.   Pt states he had positive Covid test 3 weeks ago. States it was a home test. He will bring the negative Covid test that he has done this week with him tomorrow. He states he has no symptoms at this time.
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
## 2021-04-28 NOTE — Anesthesia Preprocedure Evaluation (Addendum)
Anesthesia Evaluation  Patient identified by MRN, date of birth, ID band Patient awake    Reviewed: Allergy & Precautions, NPO status , Patient's Chart, lab work & pertinent test results  Airway Mallampati: IV  TM Distance: >3 FB Neck ROM: Full    Dental  (+) Edentulous Upper, Edentulous Lower   Pulmonary former smoker,  Quit smoking 1990, former light smoker  COVID + 04/09/21- currently asymptomatic    Pulmonary exam normal breath sounds clear to auscultation       Cardiovascular hypertension, Pt. on medications Normal cardiovascular exam Rhythm:Regular Rate:Normal     Neuro/Psych negative neurological ROS  negative psych ROS   GI/Hepatic negative GI ROS, (+) Hepatitis -, B  Endo/Other  negative endocrine ROS  Renal/GU negative Renal ROS  negative genitourinary   Musculoskeletal  (+) Arthritis , Osteoarthritis,  Spinal stenosis with neurogenic claudication- LLE spasms   Abdominal   Peds  Hematology negative hematology ROS (+)   Anesthesia Other Findings   Reproductive/Obstetrics negative OB ROS                           Anesthesia Physical Anesthesia Plan  ASA: 2  Anesthesia Plan: General   Post-op Pain Management:    Induction: Intravenous  PONV Risk Score and Plan: Ondansetron, Dexamethasone and Treatment may vary due to age or medical condition  Airway Management Planned: Oral ETT  Additional Equipment: None  Intra-op Plan:   Post-operative Plan: Extubation in OR  Informed Consent: I have reviewed the patients History and Physical, chart, labs and discussed the procedure including the risks, benefits and alternatives for the proposed anesthesia with the patient or authorized representative who has indicated his/her understanding and acceptance.     Dental advisory given  Plan Discussed with: CRNA  Anesthesia Plan Comments: ( )      Anesthesia Quick  Evaluation

## 2021-04-29 ENCOUNTER — Encounter (HOSPITAL_COMMUNITY): Payer: Self-pay | Admitting: Neurosurgery

## 2021-04-29 ENCOUNTER — Ambulatory Visit (HOSPITAL_COMMUNITY): Payer: Medicare HMO | Admitting: Vascular Surgery

## 2021-04-29 ENCOUNTER — Ambulatory Visit (HOSPITAL_COMMUNITY): Payer: Medicare HMO

## 2021-04-29 ENCOUNTER — Encounter (HOSPITAL_COMMUNITY)
Admission: RE | Disposition: A | Discharge status: Home / Self Care | Payer: Self-pay | Source: Home / Self Care | Attending: Neurosurgery

## 2021-04-29 ENCOUNTER — Other Ambulatory Visit: Payer: Self-pay

## 2021-04-29 ENCOUNTER — Ambulatory Visit (HOSPITAL_COMMUNITY)
Admission: RE | Admit: 2021-04-29 | Discharge: 2021-04-30 | Disposition: A | Discharge status: Home / Self Care | Payer: Medicare HMO | Attending: Neurosurgery | Admitting: Neurosurgery

## 2021-04-29 DIAGNOSIS — E785 Hyperlipidemia, unspecified: Secondary | ICD-10-CM | POA: Insufficient documentation

## 2021-04-29 DIAGNOSIS — M48062 Spinal stenosis, lumbar region with neurogenic claudication: Secondary | ICD-10-CM | POA: Diagnosis not present

## 2021-04-29 DIAGNOSIS — Z79899 Other long term (current) drug therapy: Secondary | ICD-10-CM | POA: Insufficient documentation

## 2021-04-29 DIAGNOSIS — Z8739 Personal history of other diseases of the musculoskeletal system and connective tissue: Secondary | ICD-10-CM | POA: Diagnosis not present

## 2021-04-29 DIAGNOSIS — Z419 Encounter for procedure for purposes other than remedying health state, unspecified: Secondary | ICD-10-CM

## 2021-04-29 DIAGNOSIS — M5416 Radiculopathy, lumbar region: Secondary | ICD-10-CM | POA: Insufficient documentation

## 2021-04-29 DIAGNOSIS — Z87891 Personal history of nicotine dependence: Secondary | ICD-10-CM | POA: Diagnosis not present

## 2021-04-29 DIAGNOSIS — Z8546 Personal history of malignant neoplasm of prostate: Secondary | ICD-10-CM | POA: Insufficient documentation

## 2021-04-29 DIAGNOSIS — Z7982 Long term (current) use of aspirin: Secondary | ICD-10-CM | POA: Diagnosis not present

## 2021-04-29 DIAGNOSIS — I10 Essential (primary) hypertension: Secondary | ICD-10-CM | POA: Diagnosis not present

## 2021-04-29 DIAGNOSIS — S32009K Unspecified fracture of unspecified lumbar vertebra, subsequent encounter for fracture with nonunion: Secondary | ICD-10-CM | POA: Diagnosis present

## 2021-04-29 HISTORY — PX: LUMBAR LAMINECTOMY/DECOMPRESSION MICRODISCECTOMY: SHX5026

## 2021-04-29 HISTORY — DX: Unspecified osteoarthritis, unspecified site: M19.90

## 2021-04-29 LAB — CBC
HCT: 42.7 % (ref 39.0–52.0)
Hemoglobin: 15.2 g/dL (ref 13.0–17.0)
MCH: 32.7 pg (ref 26.0–34.0)
MCHC: 35.6 g/dL (ref 30.0–36.0)
MCV: 91.8 fL (ref 80.0–100.0)
Platelets: 203 10*3/uL (ref 150–400)
RBC: 4.65 MIL/uL (ref 4.22–5.81)
RDW: 11.6 % (ref 11.5–15.5)
WBC: 6.4 10*3/uL (ref 4.0–10.5)
nRBC: 0 % (ref 0.0–0.2)

## 2021-04-29 LAB — SURGICAL PCR SCREEN
MRSA, PCR: NEGATIVE
Staphylococcus aureus: NEGATIVE

## 2021-04-29 LAB — BASIC METABOLIC PANEL
Anion gap: 7 (ref 5–15)
BUN: 22 mg/dL (ref 8–23)
CO2: 25 mmol/L (ref 22–32)
Calcium: 10 mg/dL (ref 8.9–10.3)
Chloride: 103 mmol/L (ref 98–111)
Creatinine, Ser: 0.98 mg/dL (ref 0.61–1.24)
GFR, Estimated: >60 mL/min (ref >60)
Glucose, Bld: 101 mg/dL — ABNORMAL HIGH (ref 70–99)
Potassium: 4.3 mmol/L (ref 3.5–5.1)
Sodium: 135 mmol/L (ref 135–145)

## 2021-04-29 IMAGING — CR DG LUMBAR SPINE 1V
1 series · 1 of 1 positions shown · non-contrast
Comparison: Lumbar MRI [DATE], lumbar radiograph [DATE].

CLINICAL DATA: Localization film for laminectomy.

EXAM:
LUMBAR SPINE - 1 VIEW

[lateral]
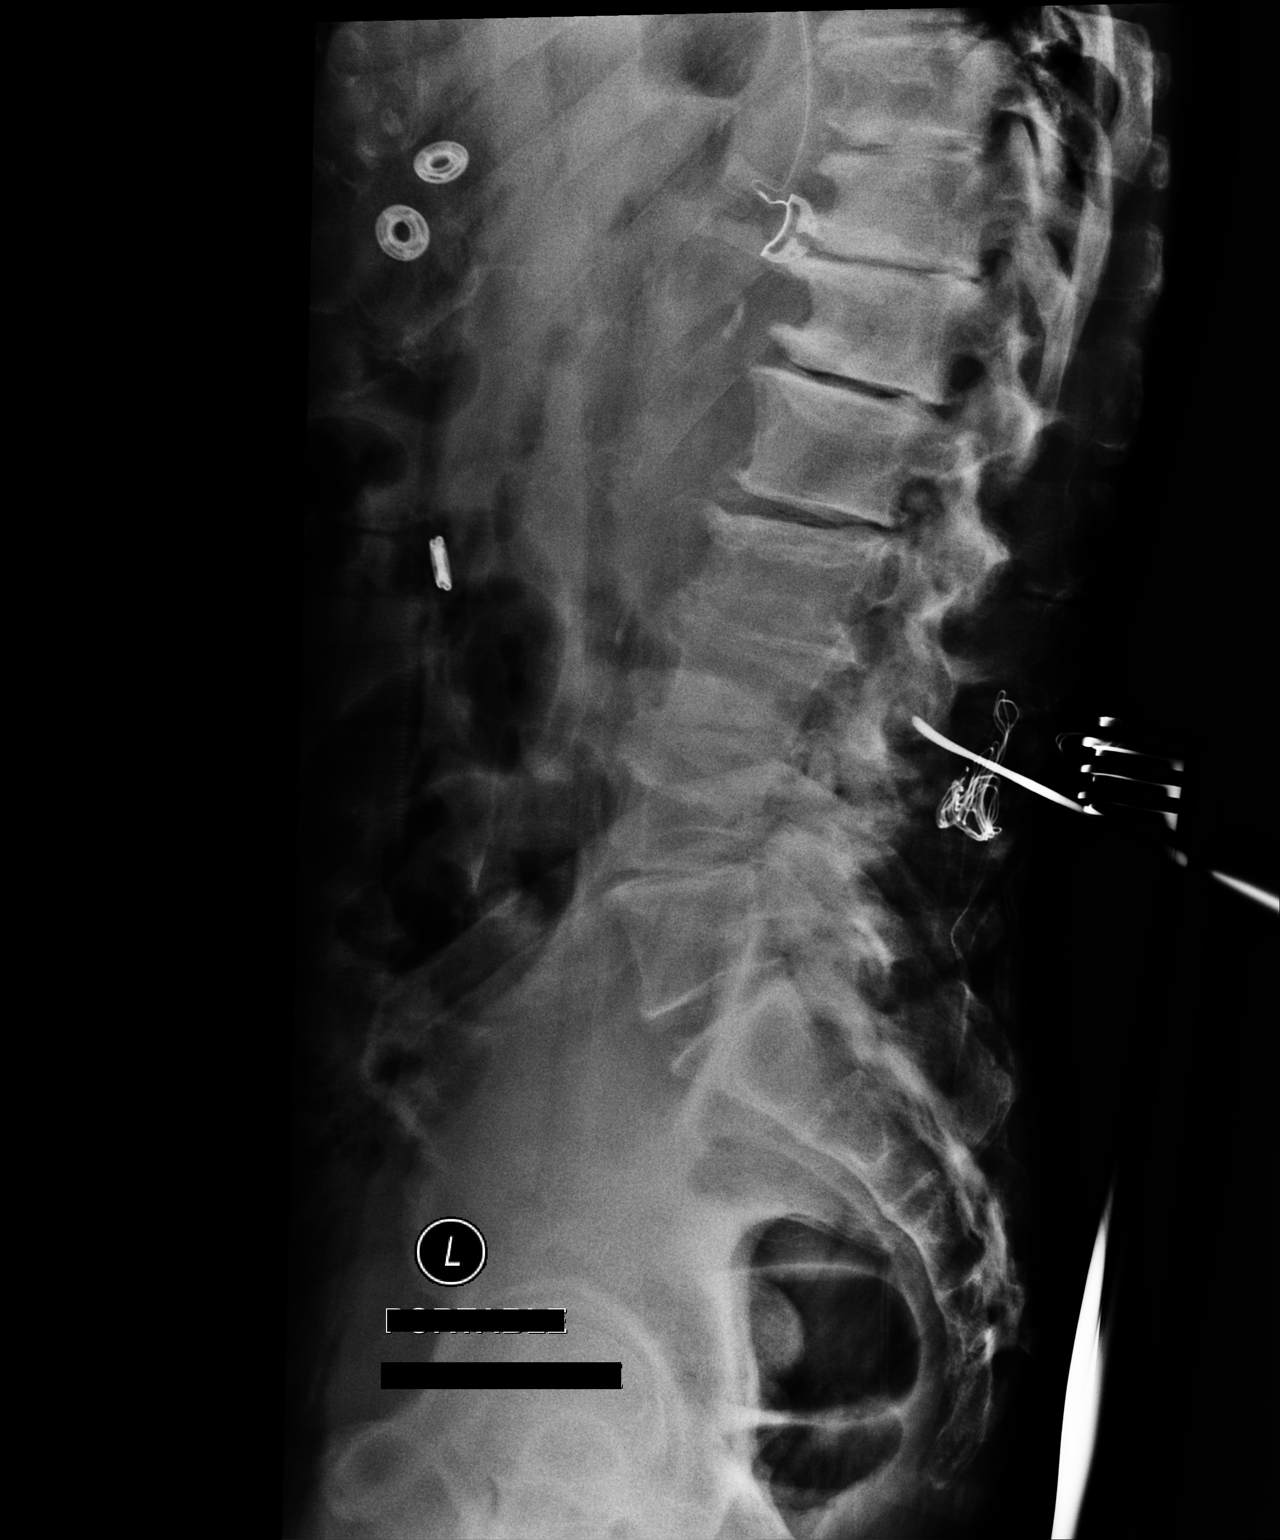

[1 of 1 positions shown; findings below may reference images not displayed]

FINDINGS: Numbering system consistent with prior MRI with the lower most
lumbar vertebra labeled L5. Previous radiograph demonstrated 6
lumbar type vertebra, numbering system will be in keeping with prior
MRI.

Single cross-table lateral view of the lumbar spine obtained in the
operating room. Surgical instruments and retractors project over the
L3 spinous process.
IMPRESSION: Surgical instrument and retractors over the L3 spinous process.

## 2021-04-29 SURGERY — LUMBAR LAMINECTOMY/DECOMPRESSION MICRODISCECTOMY 3 LEVELS
Anesthesia: General | Site: Spine Lumbar | Laterality: Bilateral

## 2021-04-29 MED ORDER — LIDOCAINE 2% (20 MG/ML) 5 ML SYRINGE
INTRAMUSCULAR | Status: DC | PRN
  Administered 2021-04-29: 60 mg via INTRAVENOUS

## 2021-04-29 MED ORDER — ONDANSETRON HCL 4 MG/2ML IJ SOLN: 4.0000 mg | Freq: Four times a day (QID) | INTRAMUSCULAR | Status: DC | PRN

## 2021-04-29 MED ORDER — SODIUM CHLORIDE 0.9 % IV SOLN
250.0000 mL | INTRAVENOUS | Status: DC
  Administered 2021-04-29: 250 mL via INTRAVENOUS

## 2021-04-29 MED ORDER — ONDANSETRON HCL 4 MG/2ML IJ SOLN
INTRAMUSCULAR | Status: AC
  Filled 2021-04-29: qty 2

## 2021-04-29 MED ORDER — ROCURONIUM BROMIDE 10 MG/ML (PF) SYRINGE
PREFILLED_SYRINGE | INTRAVENOUS | Status: DC | PRN
  Administered 2021-04-29: 100 mg via INTRAVENOUS

## 2021-04-29 MED ORDER — 0.9 % SODIUM CHLORIDE (POUR BTL) OPTIME
TOPICAL | Status: DC | PRN
  Administered 2021-04-29: 1000 mL

## 2021-04-29 MED ORDER — SODIUM CHLORIDE 0.9% FLUSH: 3.0000 mL | Freq: Two times a day (BID) | INTRAVENOUS | Status: DC

## 2021-04-29 MED ORDER — BUPIVACAINE-EPINEPHRINE 0.5% -1:200000 IJ SOLN
INTRAMUSCULAR | Status: AC
  Filled 2021-04-29: qty 1

## 2021-04-29 MED ORDER — SODIUM CHLORIDE 0.9% FLUSH: 3.0000 mL | INTRAVENOUS | Status: DC | PRN

## 2021-04-29 MED ORDER — AMLODIPINE BESYLATE 5 MG PO TABS
5.0000 mg | ORAL_TABLET | Freq: Every day | ORAL | Status: DC
  Administered 2021-04-30: 5 mg via ORAL
  Filled 2021-04-29: qty 1

## 2021-04-29 MED ORDER — ACETAMINOPHEN 650 MG RE SUPP: 650.0000 mg | RECTAL | Status: DC | PRN

## 2021-04-29 MED ORDER — CHLORHEXIDINE GLUCONATE CLOTH 2 % EX PADS: 6.0000 | MEDICATED_PAD | Freq: Once | CUTANEOUS | Status: DC

## 2021-04-29 MED ORDER — ACETAMINOPHEN 325 MG PO TABS: 650.0000 mg | ORAL_TABLET | ORAL | Status: DC | PRN

## 2021-04-29 MED ORDER — LIDOCAINE 2% (20 MG/ML) 5 ML SYRINGE
INTRAMUSCULAR | Status: AC
  Filled 2021-04-29: qty 5

## 2021-04-29 MED ORDER — PHENYLEPHRINE 40 MCG/ML (10ML) SYRINGE FOR IV PUSH (FOR BLOOD PRESSURE SUPPORT)
PREFILLED_SYRINGE | INTRAVENOUS | Status: AC
  Filled 2021-04-29: qty 10

## 2021-04-29 MED ORDER — CHLORHEXIDINE GLUCONATE 0.12 % MT SOLN
15.0000 mL | Freq: Once | OROMUCOSAL | Status: AC
  Administered 2021-04-29: 15 mL via OROMUCOSAL
  Filled 2021-04-29: qty 15

## 2021-04-29 MED ORDER — SUGAMMADEX SODIUM 200 MG/2ML IV SOLN
INTRAVENOUS | Status: DC | PRN
  Administered 2021-04-29: 200 mg via INTRAVENOUS

## 2021-04-29 MED ORDER — MORPHINE SULFATE (PF) 4 MG/ML IV SOLN: 4.0000 mg | INTRAVENOUS | Status: DC | PRN

## 2021-04-29 MED ORDER — EPHEDRINE 5 MG/ML INJ
INTRAVENOUS | Status: AC
  Filled 2021-04-29: qty 5

## 2021-04-29 MED ORDER — ROSUVASTATIN CALCIUM 5 MG PO TABS
10.0000 mg | ORAL_TABLET | Freq: Every day | ORAL | Status: DC
  Administered 2021-04-29 – 2021-04-30 (×2): 10 mg via ORAL
  Filled 2021-04-29 (×2): qty 2

## 2021-04-29 MED ORDER — EPHEDRINE SULFATE-NACL 50-0.9 MG/10ML-% IV SOSY
PREFILLED_SYRINGE | INTRAVENOUS | Status: DC | PRN
  Administered 2021-04-29: 10 mg via INTRAVENOUS

## 2021-04-29 MED ORDER — ACETAMINOPHEN 500 MG PO TABS
1000.0000 mg | ORAL_TABLET | Freq: Once | ORAL | Status: AC
  Administered 2021-04-29: 1000 mg via ORAL
  Filled 2021-04-29: qty 2

## 2021-04-29 MED ORDER — CEFAZOLIN SODIUM-DEXTROSE 2-4 GM/100ML-% IV SOLN
2.0000 g | INTRAVENOUS | Status: AC
  Administered 2021-04-29: 2 g via INTRAVENOUS
  Filled 2021-04-29: qty 100

## 2021-04-29 MED ORDER — ORAL CARE MOUTH RINSE: 15.0000 mL | Freq: Once | OROMUCOSAL | Status: AC

## 2021-04-29 MED ORDER — ACETAMINOPHEN 500 MG PO TABS
1000.0000 mg | ORAL_TABLET | Freq: Four times a day (QID) | ORAL | Status: AC
Start: 2021-04-29 — End: 2021-04-30
  Administered 2021-04-29 – 2021-04-30 (×3): 1000 mg via ORAL
  Filled 2021-04-29 (×3): qty 2

## 2021-04-29 MED ORDER — BISACODYL 10 MG RE SUPP: 10.0000 mg | Freq: Every day | RECTAL | Status: DC | PRN

## 2021-04-29 MED ORDER — OXYCODONE HCL 5 MG PO TABS
5.0000 mg | ORAL_TABLET | ORAL | Status: DC | PRN
  Administered 2021-04-29 – 2021-04-30 (×2): 5 mg via ORAL
  Filled 2021-04-29 (×2): qty 1

## 2021-04-29 MED ORDER — HYDROCODONE-ACETAMINOPHEN 5-325 MG PO TABS
1.0000 | ORAL_TABLET | ORAL | Status: DC | PRN
  Administered 2021-04-30: 1 via ORAL
  Filled 2021-04-29: qty 1

## 2021-04-29 MED ORDER — DEXAMETHASONE SODIUM PHOSPHATE 10 MG/ML IJ SOLN
INTRAMUSCULAR | Status: AC
  Filled 2021-04-29: qty 1

## 2021-04-29 MED ORDER — PROPOFOL 10 MG/ML IV BOLUS
INTRAVENOUS | Status: DC | PRN
  Administered 2021-04-29: 100 mg via INTRAVENOUS

## 2021-04-29 MED ORDER — FENTANYL CITRATE (PF) 250 MCG/5ML IJ SOLN
INTRAMUSCULAR | Status: AC
  Filled 2021-04-29: qty 5

## 2021-04-29 MED ORDER — DEXAMETHASONE SODIUM PHOSPHATE 10 MG/ML IJ SOLN
INTRAMUSCULAR | Status: DC | PRN
  Administered 2021-04-29: 10 mg via INTRAVENOUS

## 2021-04-29 MED ORDER — LACTATED RINGERS IV SOLN: INTRAVENOUS | Status: DC

## 2021-04-29 MED ORDER — PHENYLEPHRINE 40 MCG/ML (10ML) SYRINGE FOR IV PUSH (FOR BLOOD PRESSURE SUPPORT)
PREFILLED_SYRINGE | INTRAVENOUS | Status: DC | PRN
  Administered 2021-04-29 (×2): 120 ug via INTRAVENOUS

## 2021-04-29 MED ORDER — CEFAZOLIN SODIUM-DEXTROSE 2-4 GM/100ML-% IV SOLN
2.0000 g | Freq: Three times a day (TID) | INTRAVENOUS | Status: AC
  Administered 2021-04-29 – 2021-04-30 (×2): 2 g via INTRAVENOUS
  Filled 2021-04-29 (×2): qty 100

## 2021-04-29 MED ORDER — PHENYLEPHRINE HCL-NACL 20-0.9 MG/250ML-% IV SOLN
INTRAVENOUS | Status: DC | PRN
  Administered 2021-04-29: 35 ug/min via INTRAVENOUS

## 2021-04-29 MED ORDER — HYDROCHLOROTHIAZIDE 25 MG PO TABS
25.0000 mg | ORAL_TABLET | Freq: Every day | ORAL | Status: DC
  Administered 2021-04-30: 25 mg via ORAL
  Filled 2021-04-29: qty 1

## 2021-04-29 MED ORDER — BUPIVACAINE-EPINEPHRINE (PF) 0.5% -1:200000 IJ SOLN
INTRAMUSCULAR | Status: DC | PRN
  Administered 2021-04-29: 10 mL via PERINEURAL

## 2021-04-29 MED ORDER — MIDAZOLAM HCL 2 MG/2ML IJ SOLN
INTRAMUSCULAR | Status: AC
  Filled 2021-04-29: qty 2

## 2021-04-29 MED ORDER — ONDANSETRON HCL 4 MG/2ML IJ SOLN: 4.0000 mg | Freq: Once | INTRAMUSCULAR | Status: DC | PRN

## 2021-04-29 MED ORDER — BACITRACIN ZINC 500 UNIT/GM EX OINT
TOPICAL_OINTMENT | CUTANEOUS | Status: AC
  Filled 2021-04-29: qty 28.35

## 2021-04-29 MED ORDER — FENTANYL CITRATE (PF) 250 MCG/5ML IJ SOLN
INTRAMUSCULAR | Status: DC | PRN
  Administered 2021-04-29: 50 ug via INTRAVENOUS
  Administered 2021-04-29: 100 ug via INTRAVENOUS
  Administered 2021-04-29 (×2): 50 ug via INTRAVENOUS

## 2021-04-29 MED ORDER — PHENOL 1.4 % MT LIQD: 1.0000 | OROMUCOSAL | Status: DC | PRN

## 2021-04-29 MED ORDER — OXYCODONE HCL 5 MG/5ML PO SOLN: 5.0000 mg | Freq: Once | ORAL | Status: DC | PRN

## 2021-04-29 MED ORDER — OXYCODONE HCL 5 MG PO TABS
10.0000 mg | ORAL_TABLET | ORAL | Status: DC | PRN
  Administered 2021-04-29: 10 mg via ORAL
  Filled 2021-04-29: qty 2

## 2021-04-29 MED ORDER — THROMBIN 20000 UNITS EX SOLR
CUTANEOUS | Status: AC
  Filled 2021-04-29: qty 20000

## 2021-04-29 MED ORDER — HYDROMORPHONE HCL 1 MG/ML IJ SOLN: 0.2500 mg | INTRAMUSCULAR | Status: DC | PRN

## 2021-04-29 MED ORDER — THROMBIN 5000 UNITS EX SOLR
CUTANEOUS | Status: AC
  Filled 2021-04-29: qty 5000

## 2021-04-29 MED ORDER — THROMBIN 20000 UNITS EX SOLR
CUTANEOUS | Status: DC | PRN
  Administered 2021-04-29: 20 mL via TOPICAL

## 2021-04-29 MED ORDER — PHENYLEPHRINE HCL (PRESSORS) 10 MG/ML IV SOLN
INTRAVENOUS | Status: DC | PRN
  Administered 2021-04-29: 120 ug via INTRAVENOUS
  Administered 2021-04-29: 80 ug via INTRAVENOUS
  Administered 2021-04-29: 120 ug via INTRAVENOUS

## 2021-04-29 MED ORDER — OXYCODONE HCL 5 MG PO TABS: 5.0000 mg | ORAL_TABLET | Freq: Once | ORAL | Status: DC | PRN

## 2021-04-29 MED ORDER — ONDANSETRON HCL 4 MG PO TABS: 4.0000 mg | ORAL_TABLET | Freq: Four times a day (QID) | ORAL | Status: DC | PRN

## 2021-04-29 MED ORDER — IRBESARTAN 300 MG PO TABS
300.0000 mg | ORAL_TABLET | Freq: Every day | ORAL | Status: DC
  Administered 2021-04-30: 300 mg via ORAL
  Filled 2021-04-29: qty 1
  Filled 2021-04-29: qty 2

## 2021-04-29 MED ORDER — CYCLOBENZAPRINE HCL 10 MG PO TABS: 10.0000 mg | ORAL_TABLET | Freq: Three times a day (TID) | ORAL | Status: DC | PRN

## 2021-04-29 MED ORDER — KETOROLAC TROMETHAMINE 30 MG/ML IJ SOLN: 30.0000 mg | Freq: Once | INTRAMUSCULAR | Status: DC | PRN

## 2021-04-29 MED ORDER — PROPOFOL 10 MG/ML IV BOLUS
INTRAVENOUS | Status: AC
  Filled 2021-04-29: qty 20

## 2021-04-29 MED ORDER — DOCUSATE SODIUM 100 MG PO CAPS
100.0000 mg | ORAL_CAPSULE | Freq: Two times a day (BID) | ORAL | Status: DC
  Administered 2021-04-29 – 2021-04-30 (×2): 100 mg via ORAL
  Filled 2021-04-29 (×2): qty 1

## 2021-04-29 MED ORDER — MENTHOL 3 MG MT LOZG: 1.0000 | LOZENGE | OROMUCOSAL | Status: DC | PRN

## 2021-04-29 MED ORDER — THROMBIN 5000 UNITS EX SOLR
OROMUCOSAL | Status: DC | PRN
  Administered 2021-04-29: 5 mL via TOPICAL

## 2021-04-29 MED ORDER — ONDANSETRON HCL 4 MG/2ML IJ SOLN
INTRAMUSCULAR | Status: DC | PRN
  Administered 2021-04-29: 4 mg via INTRAVENOUS

## 2021-04-29 MED ORDER — FINASTERIDE 5 MG PO TABS
5.0000 mg | ORAL_TABLET | Freq: Every day | ORAL | Status: DC
  Administered 2021-04-30: 5 mg via ORAL
  Filled 2021-04-29: qty 1

## 2021-04-29 SURGICAL SUPPLY — 40 items
BAG COUNTER SPONGE SURGICOUNT (BAG) ×2 IMPLANT
BENZOIN TINCTURE PRP APPL 2/3 (GAUZE/BANDAGES/DRESSINGS) ×2 IMPLANT
BUR MATCHSTICK NEURO 3.0 LAGG (BURR) ×2 IMPLANT
BUR PRECISION FLUTE 6.0 (BURR) ×2 IMPLANT
CANISTER SUCT 3000ML PPV (MISCELLANEOUS) ×2 IMPLANT
CARTRIDGE OIL MAESTRO DRILL (MISCELLANEOUS) ×1 IMPLANT
DIFFUSER DRILL AIR PNEUMATIC (MISCELLANEOUS) ×2 IMPLANT
DRAPE LAPAROTOMY 100X72X124 (DRAPES) ×2 IMPLANT
DRAPE MICROSCOPE LEICA (MISCELLANEOUS) ×2 IMPLANT
DRAPE SURG 17X23 STRL (DRAPES) ×8 IMPLANT
DRSG OPSITE POSTOP 4X8 (GAUZE/BANDAGES/DRESSINGS) ×2 IMPLANT
ELECT BLADE 4.0 EZ CLEAN MEGAD (MISCELLANEOUS) ×2
ELECT REM PT RETURN 9FT ADLT (ELECTROSURGICAL) ×2
ELECTRODE BLDE 4.0 EZ CLN MEGD (MISCELLANEOUS) ×1 IMPLANT
ELECTRODE REM PT RTRN 9FT ADLT (ELECTROSURGICAL) ×1 IMPLANT
EVACUATOR SILICONE 100CC (DRAIN) ×2 IMPLANT
GAUZE 4X4 16PLY ~~LOC~~+RFID DBL (SPONGE) ×2 IMPLANT
GAUZE SPONGE 4X4 12PLY STRL (GAUZE/BANDAGES/DRESSINGS) IMPLANT
GLOVE SURG ENC MOIS LTX SZ8 (GLOVE) ×4 IMPLANT
GLOVE SURG ENC MOIS LTX SZ8.5 (GLOVE) ×4 IMPLANT
GOWN STRL REUS W/ TWL LRG LVL3 (GOWN DISPOSABLE) ×1 IMPLANT
GOWN STRL REUS W/ TWL XL LVL3 (GOWN DISPOSABLE) ×2 IMPLANT
GOWN STRL REUS W/TWL 2XL LVL3 (GOWN DISPOSABLE) IMPLANT
GOWN STRL REUS W/TWL LRG LVL3 (GOWN DISPOSABLE) ×1
GOWN STRL REUS W/TWL XL LVL3 (GOWN DISPOSABLE) ×2
KIT BASIN OR (CUSTOM PROCEDURE TRAY) ×2 IMPLANT
KIT TURNOVER KIT B (KITS) ×2 IMPLANT
NEEDLE HYPO 21X1.5 SAFETY (NEEDLE) IMPLANT
NEEDLE HYPO 22GX1.5 SAFETY (NEEDLE) ×2 IMPLANT
NS IRRIG 1000ML POUR BTL (IV SOLUTION) ×2 IMPLANT
OIL CARTRIDGE MAESTRO DRILL (MISCELLANEOUS) ×2
PACK LAMINECTOMY NEURO (CUSTOM PROCEDURE TRAY) ×2 IMPLANT
SPONGE SURGIFOAM ABS GEL 100 (HEMOSTASIS) ×2 IMPLANT
STRIP CLOSURE SKIN 1/2X4 (GAUZE/BANDAGES/DRESSINGS) ×2 IMPLANT
SUT VIC AB 1 CT1 18XBRD ANBCTR (SUTURE) ×1 IMPLANT
SUT VIC AB 1 CT1 8-18 (SUTURE) ×1
SUT VIC AB 2-0 CP2 18 (SUTURE) ×4 IMPLANT
TOWEL GREEN STERILE (TOWEL DISPOSABLE) ×2 IMPLANT
TOWEL GREEN STERILE FF (TOWEL DISPOSABLE) ×2 IMPLANT
WATER STERILE IRR 1000ML POUR (IV SOLUTION) ×2 IMPLANT

## 2021-04-29 NOTE — Op Note (Signed)
Brief history: The patient is a 75 year old white male who has complained of back and left great and right leg pain.  He has failed medical management.  He was worked up with a lumbar MRI which demonstrated spinal stenosis at multiple levels.  I discussed the various treatment options with him.  He has decided proceed with surgery.  Preoperative diagnosis: Lumbar spinal stenosis with neurogenic claudication, lumbago, lumbar radiculopathy  Postoperative diagnosis: The same Procedure: Bilateral L1-2, L3-4 and L4-5 laminotomy/foraminotomies/medial facetectomy to decompress the bilateral L2, L4 and L5 nerve roots  Surgeon: Dr. Earle Gell  Asst.: Arnetha Massy, NP  Anesthesia: Gen. endotracheal  Estimated blood loss: 100 cc  Drains: 1 Jackson-Pratt drain in the epidural space.  Complications: None  Description of procedure: The patient was brought to the operating room by the anesthesia team. General endotracheal anesthesia was induced. The patient was turned to the prone position on the Wilson frame. The patient's lumbosacral region was then prepared with Betadine scrub and Betadine solution. Sterile drapes were applied.  I then injected the area to be incised with Marcaine with epinephrine solution. I then used the scalpel to make a linear midline incision over the L1-2, L2-3, L3-4 and L4-5 interspace. I then used electrocautery to perform a left subperiosteal dissection exposing the left spinous process and lamina of L1-2, L2-3, L3-4 and L4-5. We then obtained intraoperative radiograph to confirm our location. We then inserted the [facility] retractor to provide exposure.  I began the decompression by using the high speed drill to perform laminotomies at on the left at L1-2, L3-4 and L4-5. We then used the Kerrison punches to widen the laminotomy and removed the ligamentum flavum at L1-2, L3-4 and L4-5.  I used a drill to drill medially across the midline. We used the Kerrison punches to  remove the ligamentum flavum bilaterally at L1-2, L3-4 and L4-5. We performed wide foraminotomies about the bilateral L2, L4 and L5 nerve roots completing the decompression.  We inspected the intervertebral disc at L1-2, L3-4 and L4-5 and did not see any significant herniations.  We palpated on the exit route of the bilateral L2, L4 and L5 nerve roots and noted to well decompressed.  .We then obtained hemostasis using bipolar electrocautery. We irrigated the wound out with saline solution. We inspected the thecal sac and nerve roots and noted they were well decompressed. We then removed the retractor.   We reapproximated patient's thoracolumbar fascia with interrupted #1 Vicryl suture. We reapproximated patient's subcutaneous tissue with interrupted 2-0 Vicryl suture. The reapproximated patient's skin with Steri-Strips and benzoin. The wound was then coated with bacitracin ointment. A sterile dressing was applied. The drapes were removed. The patient was subsequently returned to the supine position where they were extubated by the anesthesia team. He was then transported to the post anesthesia care unit in stable condition. All sponge instrument and needle counts were reportedly correct at the end of this case.

## 2021-04-29 NOTE — Transfer of Care (Signed)
Immediate Anesthesia Transfer of Care Note  Patient: Anthony Mann  Procedure(s) Performed: LAMINECTOMY/LAMINOTOMY AND FORAMINOTOMY BILATERAL LUMBAR ONE-TWO, LUMBAR THREE-FOUR, LEFT LUMBAR FOUR-FIVE (Bilateral: Spine Lumbar)  Patient Location: PACU  Anesthesia Type:General  Level of Consciousness: awake, alert , oriented and patient cooperative  Airway & Oxygen Therapy: Patient Spontanous Breathing  Post-op Assessment: Report given to RN, Post -op Vital signs reviewed and stable and Patient moving all extremities X 4  Post vital signs: Reviewed and stable  Last Vitals:  Vitals Value Taken Time  BP    Temp    Pulse 74 04/29/21 1612  Resp 11 04/29/21 1612  SpO2 98 % 04/29/21 1612  Vitals shown include unvalidated device data.  Last Pain:  Vitals:   04/29/21 1115  TempSrc:   PainSc: 0-No pain         Complications: No notable events documented.

## 2021-04-29 NOTE — H&P (Signed)
Subjective: The patient is a 75 year old white male on whom I previously performed a C4-5 anterior cervicectomy fusion and plating for cervical myelopathy.  He is also complained of back and leg pain.  He had a lumbar MRI which demonstrated severe spinal stenosis at L3-4 with more moderate stenosis at L1-2.  I discussed the various treatment options.  He has decided to proceed with a lumbar laminectomy.  Past Medical History:  Diagnosis Date   Arthritis    Cataract    right eye in [place], left here 10/2013   History of hepatitis B    Hyperlipidemia    Hypertension    Prostate cancer ([place]) 2018    Past Surgical History:  Procedure Laterality Date   arm trauma     he had an industrial accident in [place]  03/24/2016   EYE SURGERY     right eye cataract  in [place]  last July 2020   negative results per pt   SKIN GRAFT FULL THICKNESS ARM     right arm   ULNAR NERVE TRANSPOSITION     in 1991    No Known Allergies  Social History   Tobacco Use   Smoking status: Former    Packs/day: 0.50    Years: 9.00    Pack years: 4.50    Types: Cigarettes    Quit date: 06/13/1989    Years since quitting: 31.8   Smokeless tobacco: Never  Substance Use Topics   Alcohol use: Yes    Alcohol/week: 3.0 standard drinks    Types: 2 Cans of beer, 1 Shots of liquor per week    Comment: occasional beer    Family History  Problem Relation Age of Onset   Cancer Father        colon   Heart disease Father 12   Kidney disease Father        nephectomy   Colon cancer Father 54   Diabetes Mother 7   Colon polyps Neg Hx    Esophageal cancer Neg Hx    Rectal cancer Neg Hx    Stomach cancer Neg Hx    Prior to Admission medications   Medication Sig Start Date End Date Taking? Authorizing Provider  amLODipine (NORVASC) 5 MG tablet Take 1 tablet (5 mg total) by mouth daily. 05/09/20  Yes Marin Olp, MD  aspirin EC 81 MG tablet Take 81 mg by mouth daily.   Yes [provider]  finasteride (PROSCAR) 5 MG tablet Take 5 mg by mouth daily. 02/23/21  Yes [provider]  hydrochlorothiazide (HYDRODIURIL) 25 MG tablet Take 1 tablet (25 mg total) by mouth daily. 05/09/20  Yes Marin Olp, MD  HYDROcodone-acetaminophen (NORCO/VICODIN) 5-325 MG tablet Take 1 tablet by mouth every 4 (four) hours as needed for pain. 03/09/21  Yes [provider]  irbesartan (AVAPRO) 300 MG tablet Take 1 tablet (300 mg total) by mouth daily. 05/09/20  Yes Marin Olp, MD  rosuvastatin (CRESTOR) 10 MG tablet TAKE 1 TABLET BY MOUTH EVERY DAY Patient taking differently: Take 10 mg by mouth every other day. 11/10/20  Yes Marin Olp, MD  diazepam (VALIUM) 5 MG tablet Take 1 tablet (5 mg total) by mouth 2 (two) times daily. Patient not taking: Reported on 04/28/2021 02/06/21   Fredia Sorrow, MD     Review of Systems  Positive ROS: As above  All other systems have been reviewed and were otherwise negative  with the exception of those mentioned in the HPI and as above.  Objective: Vital signs in last 24 hours: Temp:  [97.9 F (36.6 C)] 97.9 F (36.6 C) (08/17 1101) Pulse Rate:  [75] 75 (08/17 1101) Resp:  [17] 17 (08/17 1101) BP: (145)/(66) 145/66 (08/17 1101) SpO2:  [97 %] 97 % (08/17 1101) Weight:  [76.2 kg] 76.2 kg (08/17 1101) Estimated body mass index is 27.96 kg/m as calculated from the following:   Height as of this encounter: '5\' 5"'$  (1.651 m).   Weight as of this encounter: 76.2 kg.   General Appearance: Alert Head: Normocephalic, without obvious abnormality, atraumatic Eyes: PERRL, conjunctiva/corneas clear, EOM's intact,    Ears: Normal  Throat: Normal  Neck: Limited cervical range of motion.  His anterior cervical incision is well-healed. Back: unremarkable Lungs: Clear to auscultation bilaterally, respirations unlabored Heart: Regular rate and rhythm, no murmur, rub or gallop Abdomen: Soft, non-tender Extremities:  Extremities normal, atraumatic, no cyanosis or edema Skin: unremarkable  NEUROLOGIC:   Mental status: alert and oriented,Motor Exam -he has weakness in his right deltoid. Sensory Exam - grossly normal Reflexes:  Coordination - grossly normal Gait - grossly normal Balance - grossly normal Cranial Nerves: I: smell Not tested  II: visual acuity  OS: Normal  OD: Normal   II: visual fields Full to confrontation  II: pupils Equal, round, reactive to light  III,VII: ptosis None  III,IV,VI: extraocular muscles  Full ROM  V: mastication Normal  V: facial light touch sensation  Normal  V,VII: corneal reflex  Present  VII: facial muscle function - upper  Normal  VII: facial muscle function - lower Normal  VIII: hearing Not tested  IX: soft palate elevation  Normal  IX,X: gag reflex Present  XI: trapezius strength  5/5  XI: sternocleidomastoid strength 5/5  XI: neck flexion strength  5/5  XII: tongue strength  Normal    Data Review Lab Results  Component Value Date   WBC 6.4 04/29/2021   HGB 15.2 04/29/2021   HCT 42.7 04/29/2021   MCV 91.8 04/29/2021   PLT 203 04/29/2021   Lab Results  Component Value Date   NA 135 04/29/2021   K 4.3 04/29/2021   CL 103 04/29/2021   CO2 25 04/29/2021   BUN 22 04/29/2021   CREATININE 0.98 04/29/2021   GLUCOSE 101 (H) 04/29/2021   No results found for: INR, PROTIME  Assessment/Plan: Lumbar spinal stenosis, lumbago, lumbar radiculopathy, neurogenic claudication: I have discussed the situation with the patient.  I reviewed his imaging studies with him and pointed out the abnormalities.  We have discussed the various treatment options including surgery.  I have stressed described the surgical treatment option of a lumbar laminectomy.  I have shown him surgical models.  I have given him a surgical pamphlet.  We have discussed the risk, benefits, alternatives, expected postoperative course, and likelihood of achieving our goals with surgery.  I  have answered all his questions.  He has decided proceed with surgery.   Ophelia Charter 04/29/2021 12:54 PM

## 2021-04-29 NOTE — Anesthesia Procedure Notes (Signed)
Procedure Name: Intubation Date/Time: 04/29/2021 1:44 PM Performed by: Asher Muir, CRNA Pre-anesthesia Checklist: Patient identified, Emergency Drugs available, Suction available and Patient being monitored Patient Re-evaluated:Patient Re-evaluated prior to induction Oxygen Delivery Method: Circle system utilized and Simple face mask Preoxygenation: Pre-oxygenation with 100% oxygen Induction Type: IV induction Ventilation: Two handed mask ventilation required Laryngoscope Size: Mac and 3 Grade View: Grade II Tube type: Oral Tube size: 7.5 mm Number of attempts: 1 Airway Equipment and Method: Stylet Placement Confirmation: ETT inserted through vocal cords under direct vision, positive ETCO2 and breath sounds checked- equal and bilateral Secured at: 22 (right lip) cm Tube secured with: Tape Dental Injury: Teeth and Oropharynx as per pre-operative assessment

## 2021-04-30 ENCOUNTER — Encounter (HOSPITAL_COMMUNITY): Payer: Self-pay | Admitting: Neurosurgery

## 2021-04-30 DIAGNOSIS — Z8546 Personal history of malignant neoplasm of prostate: Secondary | ICD-10-CM | POA: Diagnosis not present

## 2021-04-30 DIAGNOSIS — M5416 Radiculopathy, lumbar region: Secondary | ICD-10-CM | POA: Diagnosis not present

## 2021-04-30 DIAGNOSIS — Z79899 Other long term (current) drug therapy: Secondary | ICD-10-CM | POA: Diagnosis not present

## 2021-04-30 DIAGNOSIS — Z87891 Personal history of nicotine dependence: Secondary | ICD-10-CM | POA: Diagnosis not present

## 2021-04-30 DIAGNOSIS — M48062 Spinal stenosis, lumbar region with neurogenic claudication: Secondary | ICD-10-CM | POA: Diagnosis not present

## 2021-04-30 DIAGNOSIS — E785 Hyperlipidemia, unspecified: Secondary | ICD-10-CM | POA: Diagnosis not present

## 2021-04-30 DIAGNOSIS — I10 Essential (primary) hypertension: Secondary | ICD-10-CM | POA: Diagnosis not present

## 2021-04-30 DIAGNOSIS — Z7982 Long term (current) use of aspirin: Secondary | ICD-10-CM | POA: Diagnosis not present

## 2021-04-30 MED ORDER — OXYCODONE-ACETAMINOPHEN 5-325 MG PO TABS: 1.0000 | ORAL_TABLET | ORAL | Status: DC | PRN

## 2021-04-30 MED ORDER — DOCUSATE SODIUM 100 MG PO CAPS: 100.0000 mg | ORAL_CAPSULE | Freq: Two times a day (BID) | ORAL | 0 refills | Status: DC

## 2021-04-30 MED ORDER — OXYCODONE-ACETAMINOPHEN 5-325 MG PO TABS: 1.0000 | ORAL_TABLET | ORAL | 0 refills | Status: DC | PRN

## 2021-04-30 NOTE — Progress Notes (Signed)
PT Cancellation Note  Patient Details Name: Anthony Mann MRN: [id number] DOB: 28-Oct-1945   Cancelled Treatment:    Reason Eval/Treat Not Completed: PT screened, no needs identified, will sign off. Discussed pt case with OT who reports that pt is currently functioning at an independent level (no AD) and all education has been completed including stair training. Will sign off at this time, but if needs change, please reconsult.   Thelma Comp 04/30/2021, 1:11 PM  Rolinda Roan, PT, DPT Acute Rehabilitation Services Pager: [phone] Office: [phone]

## 2021-04-30 NOTE — Progress Notes (Signed)
Patient was transported via wheelchair by volunteer for discharge home; in no acute distress nor complaints of pain nor discomfort; room was checked and accounted for all his belongings; discharge instructions given to patient and his wife by RN and he verbalized understanding on the instructions given.

## 2021-04-30 NOTE — Discharge Summary (Signed)
Physician Discharge Summary  Patient ID: Anthony Mann MRN: [id number] DOB/AGE: Oct 05, 1945 75 y.o.  Admit date: 04/29/2021 Discharge date: 04/30/2021  Admission Diagnoses: Lumbar spinal stenosis with neurogenic claudication, lumbago, lumbar radiculopathy  Discharge Diagnoses: The same Active Problems:   Lumbar pseudoarthrosis   Discharged Condition: good  Hospital Course: I performed bilateral L1-2, L3-4 and L4-5 laminotomy/foraminotomies on the patient on 04/29/2021.  The surgery went well.  The patient's postoperative course was unremarkable.  On postoperative day 1 he requested discharge home.  The patient, and his wife, were given verbal and written discharge instructions.  All their questions were answered.  Consults: PT, OT, care management Significant Diagnostic Studies: None Treatments: Bilateral L1-2, L3-4 and L4-5 laminotomy/foraminotomies Discharge Exam: Blood pressure 126/61, pulse 75, temperature 97.7 F (36.5 C), temperature source Oral, resp. rate 18, height '5\' 5"'$  (1.651 m), weight 76.2 kg, SpO2 98 %. The patient is alert and pleasant.  He looks well.  His strength is normal.  Disposition: Home  Discharge Instructions     Call MD for:  difficulty breathing, headache or visual disturbances   Complete by: As directed    Call MD for:  extreme fatigue   Complete by: As directed    Call MD for:  hives   Complete by: As directed    Call MD for:  persistant dizziness or light-headedness   Complete by: As directed    Call MD for:  persistant nausea and vomiting   Complete by: As directed    Call MD for:  redness, tenderness, or signs of infection (pain, swelling, redness, odor or green/yellow discharge around incision site)   Complete by: As directed    Call MD for:  severe uncontrolled pain   Complete by: As directed    Call MD for:  temperature >100.4   Complete by: As directed    Diet - low sodium heart healthy   Complete by: As directed    Discharge  instructions   Complete by: As directed    Call [phone] for a followup appointment. Take a stool softener while you are using pain medications.   Driving Restrictions   Complete by: As directed    Do not drive for 2 weeks.   Increase activity slowly   Complete by: As directed    Lifting restrictions   Complete by: As directed    Do not lift more than 5 pounds. No excessive bending or twisting.   May shower / Bathe   Complete by: As directed    Remove the dressing for 3 days after surgery.  You may shower, but leave the incision alone.   Remove dressing in 48 hours   Complete by: As directed       Allergies as of 04/30/2021   No Known Allergies      Medication List     STOP taking these medications    diazepam 5 MG tablet Commonly known as: VALIUM   HYDROcodone-acetaminophen 5-325 MG tablet Commonly known as: NORCO/VICODIN       TAKE these medications    amLODipine 5 MG tablet Commonly known as: NORVASC Take 1 tablet (5 mg total) by mouth daily.   aspirin EC 81 MG tablet Take 81 mg by mouth daily.   docusate sodium 100 MG capsule Commonly known as: COLACE Take 1 capsule (100 mg total) by mouth 2 (two) times daily.   finasteride 5 MG tablet Commonly known as: PROSCAR Take 5 mg by mouth daily.   hydrochlorothiazide 25 MG tablet  Commonly known as: HYDRODIURIL Take 1 tablet (25 mg total) by mouth daily.   irbesartan 300 MG tablet Commonly known as: AVAPRO Take 1 tablet (300 mg total) by mouth daily.   oxyCODONE-acetaminophen 5-325 MG tablet Commonly known as: PERCOCET/ROXICET Take 1-2 tablets by mouth every 4 (four) hours as needed for moderate pain.   rosuvastatin 10 MG tablet Commonly known as: CRESTOR TAKE 1 TABLET BY MOUTH EVERY DAY What changed: when to take this         Signed: Ophelia Charter 04/30/2021, 10:20 AM

## 2021-04-30 NOTE — Anesthesia Postprocedure Evaluation (Signed)
Anesthesia Post Note  Patient: Anthony Mann  Procedure(s) Performed: LAMINECTOMY/LAMINOTOMY AND FORAMINOTOMY BILATERAL LUMBAR ONE-TWO, LUMBAR THREE-FOUR, LEFT LUMBAR FOUR-FIVE (Bilateral: Spine Lumbar)     Patient location during evaluation: PACU Anesthesia Type: General Level of consciousness: awake and alert Pain management: pain level controlled Vital Signs Assessment: post-procedure vital signs reviewed and stable Respiratory status: spontaneous breathing, nonlabored ventilation, respiratory function stable and patient connected to nasal cannula oxygen Cardiovascular status: blood pressure returned to baseline and stable Postop Assessment: no apparent nausea or vomiting Anesthetic complications: no   No notable events documented.               Effie Berkshire

## 2021-04-30 NOTE — Evaluation (Signed)
Occupational Therapy Evaluation Patient Details Name: Anthony Mann MRN: [id number] DOB: 05-09-1946 Today's Date: 04/30/2021    History of Present Illness 75 yo M s/p Lumbar Decompression.  PMH includes: Arthritis, Cataract, History of hepatitis B, Hyperlipidemia, Hypertension, Prostate cancer.   Clinical Impression   Patient admitted for the diagnosis above.  PTA he lives with his spouse, who is able to assist as needed.  Mild unsteadiness noted, but he is at his baseline for mobility, stairs, and ADL completion. No PT needs identified, and communicated to PT on the floor.  All questions answered, and no further OT needs identified.       Follow Up Recommendations  No OT follow up    Equipment Recommendations  None recommended by OT    Recommendations for Other Services       Precautions / Restrictions Precautions Precautions: Back Precaution Booklet Issued: Yes (comment) Precaution Comments: verbalized understanding Required Braces or Orthoses: Spinal Brace Spinal Brace: Lumbar corset Restrictions Weight Bearing Restrictions: No      Mobility Bed Mobility Overal bed mobility: Modified Independent             General bed mobility comments: use of SR's Patient Response: Cooperative  Transfers Overall transfer level: Independent                    Balance Overall balance assessment: Mild deficits observed, not formally tested                                         ADL either performed or assessed with clinical judgement   ADL Overall ADL's : At baseline                                             Vision Patient Visual Report: No change from baseline       Perception     Praxis      Pertinent Vitals/Pain Pain Assessment: 0-10 Pain Score: 3  Pain Location: operative site Pain Descriptors / Indicators: Tender Pain Intervention(s): Monitored during session     Hand Dominance Right   Extremity/Trunk  Assessment Upper Extremity Assessment Upper Extremity Assessment: LUE deficits/detail LUE Deficits / Details: L shoulder dysfunction post C-spine surgery LUE Sensation: WNL LUE Coordination: WNL   Lower Extremity Assessment Lower Extremity Assessment: Overall WFL for tasks assessed   Cervical / Trunk Assessment Cervical / Trunk Assessment: Other exceptions Cervical / Trunk Exceptions: L spine surgery   Communication Communication Communication: No difficulties   Cognition Arousal/Alertness: Awake/alert Behavior During Therapy: WFL for tasks assessed/performed Overall Cognitive Status: Within Functional Limits for tasks assessed                                                      Home Living Family/patient expects to be discharged to:: Private residence Living Arrangements: Spouse/significant other Available Help at Discharge: Family;Available 24 hours/day Type of Home: House Home Access: Stairs to enter [employer] of Steps: 3   Home Layout: Two level;Bed/bath upstairs Alternate Level Stairs-Number of Steps: full flight Alternate Level Stairs-Rails: Left Bathroom Shower/Tub: Tub/shower unit;Walk-in shower   Bathroom Toilet:  Standard Bathroom Accessibility: Yes How Accessible: Accessible via walker Home Equipment: Other (comment) (walking stick)          Prior Functioning/Environment Level of Independence: Independent                 OT Problem List: Impaired balance (sitting and/or standing)      OT Treatment/Interventions:      OT Goals(Current goals can be found in the care plan section) Acute Rehab OT Goals Patient Stated Goal: Return home and recover OT Goal Formulation: With patient Time For Goal Achievement: 04/30/21 Potential to Achieve Goals: Good  OT Frequency:     Barriers to D/C:  None noted          Co-evaluation              AM-PAC OT "6 Clicks" Daily Activity     Outcome Measure Help from  another person eating meals?: None Help from another person taking care of personal grooming?: None Help from another person toileting, which includes using toliet, bedpan, or urinal?: None Help from another person bathing (including washing, rinsing, drying)?: None Help from another person to put on and taking off regular upper body clothing?: None Help from another person to put on and taking off regular lower body clothing?: None 6 Click Score: 24   End of Session    Activity Tolerance: Patient tolerated treatment well Patient left: Other (comment) (sitting edge of bed)  OT Visit Diagnosis: Unsteadiness on feet (R26.81)                Time: 1000-1020 OT Time Calculation (min): 20 min Charges:  OT General Charges $OT Visit: 1 Visit OT Evaluation $OT Eval Moderate Complexity: 1 Mod  04/30/2021  Rich, OTR/L  Acute Rehabilitation Services  Office:  [place] 04/30/2021, 10:27 AM

## 2021-05-15 ENCOUNTER — Other Ambulatory Visit: Payer: Self-pay | Admitting: Family Medicine

## 2021-05-15 NOTE — Progress Notes (Signed)
Phone: [phone]   Subjective:  Patient presents today for their annual physical. Chief complaint-noted.   See problem oriented charting- ROS- full  review of systems was completed and negative  except for: back pain  The following were reviewed and entered/updated in epic: Past Medical History:  Diagnosis Date   Arthritis    Cataract    right eye in [place], left here 10/2013   History of hepatitis B    Hyperlipidemia    Hypertension    Prostate cancer ([place]) 2018   Patient Active Problem List   Diagnosis Date Noted   Prostate cancer ([place]) 08/03/2017    Priority: High   Hepatitis B     Priority: High   Depressed mood 06/27/2017    Priority: Medium   Hyperlipidemia 07/16/2014    Priority: Medium   HTN (hypertension) 10/14/2010    Priority: Medium   Lumbar pseudoarthrosis 04/29/2021   Atypical chest pain 12/10/2019   Low back pain 12/04/2019   Laryngopharyngeal reflux (LPR) 10/30/2019   History of adenomatous polyp of colon 05/11/2019   Osteoarthritis of right knee 11/29/2017   BPH associated with nocturia 01/26/2016   Past Surgical History:  Procedure Laterality Date   arm trauma     he had an industrial accident in [place]  03/24/2016   EYE SURGERY     right eye cataract  in [place] MICRODISCECTOMY Bilateral 04/29/2021   Procedure: LAMINECTOMY/LAMINOTOMY AND FORAMINOTOMY BILATERAL LUMBAR ONE-TWO, LUMBAR THREE-FOUR, LEFT LUMBAR FOUR-FIVE;  Surgeon: Newman Pies, MD;  Location: [facility];  Service: Neurosurgery;  Laterality: Bilateral;   PROSTATE BIOPSY  last July 2020   negative results per pt   SKIN GRAFT FULL THICKNESS ARM     right arm   ULNAR NERVE TRANSPOSITION     in 1991    Family History  Problem Relation Age of Onset   Cancer Father        colon   Heart disease Father 105   Kidney disease Father        nephectomy   Colon cancer Father 22   Diabetes Mother 38   Colon polyps Neg Hx    Esophageal cancer Neg  Hx    Rectal cancer Neg Hx    Stomach cancer Neg Hx     Medications- reviewed and updated Current Outpatient Medications  Medication Sig Dispense Refill   amLODipine (NORVASC) 5 MG tablet TAKE 1 TABLET BY MOUTH EVERY DAY 90 tablet 3   aspirin EC 81 MG tablet Take 81 mg by mouth daily.     docusate sodium (COLACE) 100 MG capsule Take 1 capsule (100 mg total) by mouth 2 (two) times daily. 60 capsule 0   finasteride (PROSCAR) 5 MG tablet Take 5 mg by mouth daily.     hydrochlorothiazide (HYDRODIURIL) 25 MG tablet TAKE 1 TABLET BY MOUTH EVERY DAY 90 tablet 3   irbesartan (AVAPRO) 300 MG tablet TAKE 1 TABLET BY MOUTH EVERY DAY 90 tablet 3   oxyCODONE-acetaminophen (PERCOCET/ROXICET) 5-325 MG tablet Take 1-2 tablets by mouth every 4 (four) hours as needed for moderate pain. 30 tablet 0   rosuvastatin (CRESTOR) 10 MG tablet TAKE 1 TABLET BY MOUTH EVERY DAY (Patient taking differently: Take 10 mg by mouth every other day.) 90 tablet 3   No current facility-administered medications for this visit.    Allergies-reviewed and updated No Known Allergies  Social History   Social History Narrative   Moved to [place] in 2011  from [place] due to more affordable.       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       Married 1972 [place]). 2 kids. 5 grandkids. 2 in [place], 3 [place]. Son coaches at [employer] for soccer team and is a Writer from there.       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 Oropharynx normal Neck: no thyromegaly CV: RRR no murmurs rubs or gallops Lungs: CTAB no crackles, wheeze, rhonchi Abdomen:  soft/nontender/nondistended/normal bowel sounds. No rebound or guarding.  Ext: no edema Skin: warm, dry Neuro: grossly normal, moves all extremities, PERRLA    Assessment and Plan  75 y.o. male presenting for annual physical.  Health Maintenance counseling: 1. Anticipatory guidance: Patient counseled regarding regular dental exams - dentures, eye exams -yearly,  avoiding smoking and second hand smoke , limiting alcohol to 2 beverages per day - occasional beer perhaps one or less a week- still be cautious due to liver.   2. Risk factor reduction:  Advised patient of need for regular exercise and diet rich and fruits and vegetables to reduce risk of heart attack and stroke. Exercise- walking in AM and PM no. Diet-appetite lower after surgery plus eating healthier in general- weight in healthier spot overall.  Wt Readings from Last 3 Encounters:  05/20/21 156 lb 12.8 oz (71.1 kg)  04/29/21 168 lb (76.2 kg)  04/10/21 167 lb (75.8 kg)  3. Immunizations/screenings/ancillary studies -Discussed Shingrix at pharmacy- he will consider- may hold off for now  -Discussed getting new variant specific booster after October 28  Immunization History  Administered Date(s) Administered   Fluad Quad(high Dose 65+) 10/14/2020, 05/20/2021   Influenza Split 07/13/2011, 07/14/2013   Influenza, High Dose Seasonal PF 07/27/2016, 06/27/2017, 06/20/2019, 07/29/2020   Influenza,inj,Quad PF,6+ Mos 07/01/2014, 08/18/2015, 07/13/2018   Influenza,inj,quad, With Preservative 07/27/2017   Influenza-Unspecified 06/14/2011, 06/12/2012, 06/13/2014   PFIZER(Purple Top)SARS-COV-2 Vaccination 10/05/2019, 10/26/2019, 09/22/2020   Pneumococcal Conjugate-13 03/11/2014   Pneumococcal Polysaccharide-23 09/13/2008   Pneumococcal-Unspecified 03/13/2014   Td 10/14/2008   Tdap 09/16/2011   Zoster, Live 01/12/2013, 09/14/2014   4. Prostate cancer screening- patient is under active surveillance with urology Dr. Lovena Neighbours- had MRI on  02/01/21 and will continue active surveillance- next visit in December for PSA and see physician Lab Results  Component Value Date   PSA 16.20 02/16/2021   PSA 11.60 03/06/2020   PSA 12.40 08/17/2019   5. Colon cancer screening - 05/08/2019 with 3 year repeat- due next year 6. Skin cancer screening- no dermatologist. advised regular sunscreen use. Denies worrisome, changing, or new skin lesions.  7. never smoker- under 100 cigarettes in lifetime- we updated his social history- this had been entered erroneously 8. STD screening - only active with [facility]  Status of chronic or acute concerns   #social update- will be 50 years married later this week! Will spend weekend in [place] with family and grandkids  #Narrowed left ear canal-there seems to be a slight bulge on the left lower portion of the ear  canal and there is another slight bulge on the right now-I am only able to see about 10% of the top of the tympanic membrane as a result-recommended ENT referral-I am not sure what the growth is-he declines for now and is not having any symptoms but he agrees to let me know if he changes his mind after discussing with -does have connection with Dr. Constance Holster-   # Laminectomy/Laminotomy surgery - 04/29/2021-04/30/2021 of L1-L2, L3-L4, L4-L5 - Docusate sodium 100 mg as needed and OxyCODONE-acetaminophen 5-325 mg twice daily was given -patient reports has done very well and pain much improved -some back soreness- not having to take the oxycodone other than sparingly  #February 2021 with cervical radiculopathy and surgery- still has weakness in the left arm/shoulder  # History of COVID-19 - 04/09/2021 - Was on Molnupiravir 200 mg twice daily . Discussed Jul 10, 2021- reasonable to get new variant booster just released   #hyperlipidemia S: Medication: Rosuvastatin 10 mg every other day -reduced from daily Lab Results  Component Value Date   CHOL 106 11/11/2020   HDL 50.40 11/11/2020   LDLCALC 29 11/11/2020    LDLDIRECT 45 05/09/2020   TRIG 134.0 11/11/2020   CHOLHDL 2 11/11/2020   A/P:  Controlled. Continue current medications unless LDL is still below 40- may reduce to rosuvastatin every 3rd day.    #hypertension S: medication: Irbesartan 300 mg daily , HCTZ 25 mg daily , Amlodipine 5 mg daily Home readings #s: similar #s at home BP Readings from Last 3 Encounters:  05/20/21 126/69  04/30/21 126/61  04/10/21 113/66  A/P:  Controlled. Continue current medications.    #Chronic hepatitis B S: Ultrasound stable last year with no hepatocellular carcinoma-due for repeat at this time  -Hepatitis B DNA was trending up and was over 2000 but ALT was less than 2 times upper limit of normal and patient declined ID referral at that time A/P: Hepatitis B DNA was trending up on last 2 checks-we discussed if continuing to trend up will need to refer to ID-he would like to hold off unless absolutely necessary.  ALT was less than 2 times upper limit of normal and that will be checked today as well  Recommended follow up: No follow-ups on file.  Lab/Order associations:   ICD-10-CM   1. Hyperlipidemia, unspecified hyperlipidemia type  E78.5     2. Primary hypertension  I10     3. Need for immunization against influenza  Z23 Flu Vaccine QUAD High Dose(Fluad)    4. Chronic viral hepatitis B without delta agent and without coma (HCC)  B18.1       No orders of the defined types were placed in this encounter.   I,Jada Bradford,acting as a scribe for Garret Reddish, MD.,have documented all relevant documentation on the behalf of Garret Reddish, MD,as directed by  Garret Reddish, MD while in the presence of Garret Reddish, MD.  I, Garret Reddish, MD, have reviewed all documentation for this visit. The documentation on 05/20/21 for the exam, diagnosis, procedures, and orders are all accurate and complete.  Return precautions advised.  Garret Reddish, MD

## 2021-05-20 ENCOUNTER — Encounter: Payer: Self-pay | Admitting: Family Medicine

## 2021-05-20 ENCOUNTER — Other Ambulatory Visit: Payer: Self-pay

## 2021-05-20 ENCOUNTER — Ambulatory Visit (INDEPENDENT_AMBULATORY_CARE_PROVIDER_SITE_OTHER): Payer: Medicare HMO | Admitting: Family Medicine

## 2021-05-20 VITALS — BP 126/69 | HR 71 | Temp 97.9°F | Ht 65.0 in | Wt 156.8 lb

## 2021-05-20 DIAGNOSIS — B181 Chronic viral hepatitis B without delta-agent: Secondary | ICD-10-CM | POA: Diagnosis not present

## 2021-05-20 DIAGNOSIS — Z0000 Encounter for general adult medical examination without abnormal findings: Secondary | ICD-10-CM

## 2021-05-20 DIAGNOSIS — E785 Hyperlipidemia, unspecified: Secondary | ICD-10-CM | POA: Diagnosis not present

## 2021-05-20 DIAGNOSIS — I10 Essential (primary) hypertension: Secondary | ICD-10-CM

## 2021-05-20 DIAGNOSIS — Z23 Encounter for immunization: Secondary | ICD-10-CM | POA: Diagnosis not present

## 2021-05-20 DIAGNOSIS — R69 Illness, unspecified: Secondary | ICD-10-CM | POA: Diagnosis not present

## 2021-05-20 LAB — COMPREHENSIVE METABOLIC PANEL
ALT: 13 U/L (ref 0–53)
AST: 17 U/L (ref 0–37)
Albumin: 4.3 g/dL (ref 3.5–5.2)
Alkaline Phosphatase: 37 U/L — ABNORMAL LOW (ref 39–117)
BUN: 19 mg/dL (ref 6–23)
CO2: 26 mEq/L (ref 19–32)
Calcium: 10.1 mg/dL (ref 8.4–10.5)
Chloride: 100 mEq/L (ref 96–112)
Creatinine, Ser: 1.05 mg/dL (ref 0.40–1.50)
GFR: 70.19 mL/min (ref >60.00)
Glucose, Bld: 91 mg/dL (ref 70–99)
Potassium: 4.3 mEq/L (ref 3.5–5.1)
Sodium: 136 mEq/L (ref 135–145)
Total Bilirubin: 1.3 mg/dL — ABNORMAL HIGH (ref 0.2–1.2)
Total Protein: 7.1 g/dL (ref 6.0–8.3)

## 2021-05-20 LAB — CBC WITH DIFFERENTIAL/PLATELET
Basophils Absolute: 0 10*3/uL (ref 0.0–0.1)
Basophils Relative: 0.5 % (ref 0.0–3.0)
Eosinophils Absolute: 0.1 10*3/uL (ref 0.0–0.7)
Eosinophils Relative: 1.5 % (ref 0.0–5.0)
HCT: 39.9 % (ref 39.0–52.0)
Hemoglobin: 13.4 g/dL (ref 13.0–17.0)
Lymphocytes Relative: 38 % (ref 12.0–46.0)
Lymphs Abs: 2.4 10*3/uL (ref 0.7–4.0)
MCHC: 33.6 g/dL (ref 30.0–36.0)
MCV: 94.6 fl (ref 78.0–100.0)
Monocytes Absolute: 0.8 10*3/uL (ref 0.1–1.0)
Monocytes Relative: 12.5 % — ABNORMAL HIGH (ref 3.0–12.0)
Neutro Abs: 3 10*3/uL (ref 1.4–7.7)
Neutrophils Relative %: 47.5 % (ref 43.0–77.0)
Platelets: 258 10*3/uL (ref 150.0–400.0)
RBC: 4.21 Mil/uL — ABNORMAL LOW (ref 4.22–5.81)
RDW: 12.8 % (ref 11.5–15.5)
WBC: 6.2 10*3/uL (ref 4.0–10.5)

## 2021-05-20 LAB — LDL CHOLESTEROL, DIRECT: Direct LDL: 60 mg/dL

## 2021-05-20 NOTE — Patient Instructions (Addendum)
Health Maintenance Due  Topic Date Due   Zoster Vaccines- Shingrix (1 of 2)   - Please check with your pharmacy to see if they have the shingrix vaccine. If they do- please get this immunization and update [place] by phone call or mychart with dates you receive the vaccine  Never done   COVID-19 Vaccine (4 - Booster for [employer] series)   - Please consider new Omicron variant COVID-19 vaccination in October (28th or later- 3 months out from covid)  12/21/2020   INFLUENZA VACCINE -Done today in office high dose.  04/13/2021    We will call you within two weeks about your referral to ultrasound of liver. If you do not hear within 2 weeks, give [place] a call.   Please stop by lab before you go If you have mychart- we will send your results within 3 business days of [place] receiving them.  If you do not have mychart- we will call you about results within 5 business days of [place] receiving them.  *please also note that you will see labs on mychart as soon as they post. I will later go in and write notes on them- will say "notes from Dr. Yong Channel"  Recommended follow up: Return in about 6 months (around 11/17/2021) for follow up or sooner if needed.

## 2021-05-24 LAB — HEPATITIS B DNA, ULTRAQUANTITATIVE, PCR
Hepatitis B DNA (Calc): 2.91 Log IU/mL — ABNORMAL HIGH
Hepatitis B DNA: 813 IU/mL — ABNORMAL HIGH

## 2021-05-28 ENCOUNTER — Ambulatory Visit
Admission: RE | Admit: 2021-05-28 | Discharge: 2021-05-28 | Disposition: A | Discharge status: Home / Self Care | Payer: Medicare HMO | Source: Ambulatory Visit | Attending: Family Medicine | Admitting: Family Medicine

## 2021-05-28 DIAGNOSIS — R69 Illness, unspecified: Secondary | ICD-10-CM | POA: Diagnosis not present

## 2021-05-28 DIAGNOSIS — K8020 Calculus of gallbladder without cholecystitis without obstruction: Secondary | ICD-10-CM | POA: Diagnosis not present

## 2021-05-28 DIAGNOSIS — B181 Chronic viral hepatitis B without delta-agent: Secondary | ICD-10-CM

## 2021-05-28 IMAGING — US US ABDOMEN LIMITED
1 series · 14 of 25 positions shown · non-contrast
Comparison: [DATE]

CLINICAL DATA: Hepatitis-B

EXAM:
ULTRASOUND ABDOMEN LIMITED RIGHT UPPER QUADRANT

[Series 1: us abdomen limited · 0.20mm/px · 14 of 46 slices shown]
[im 1/46]
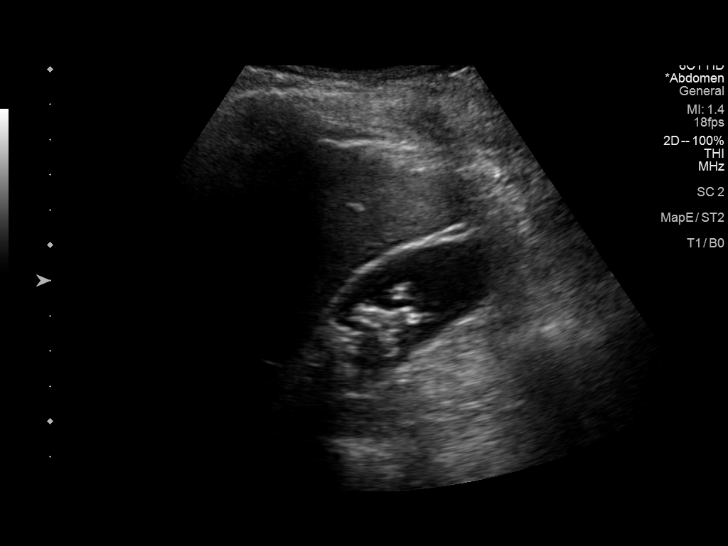
[im 4/46]
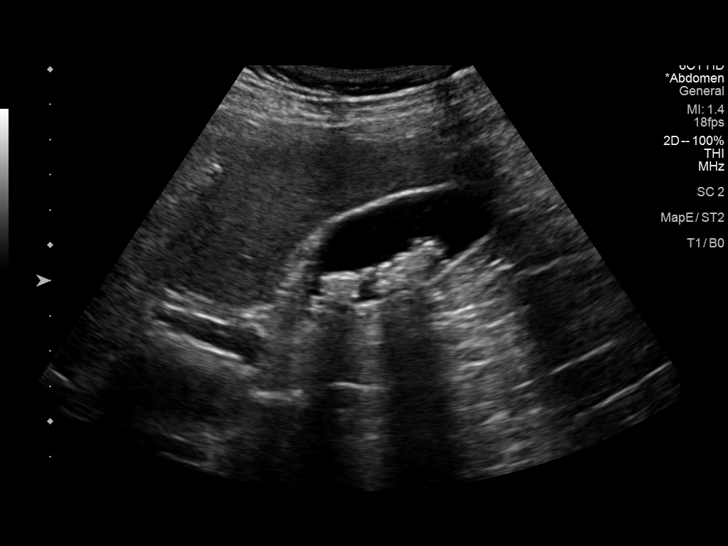
[im 8/46]
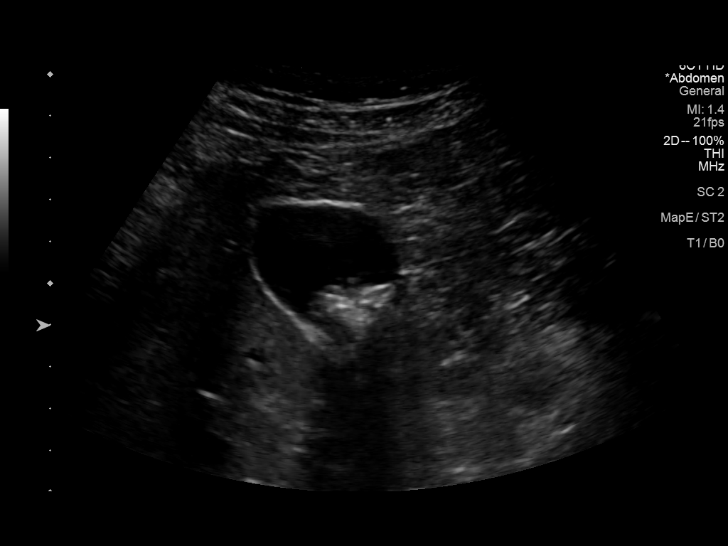
[im 12/46]
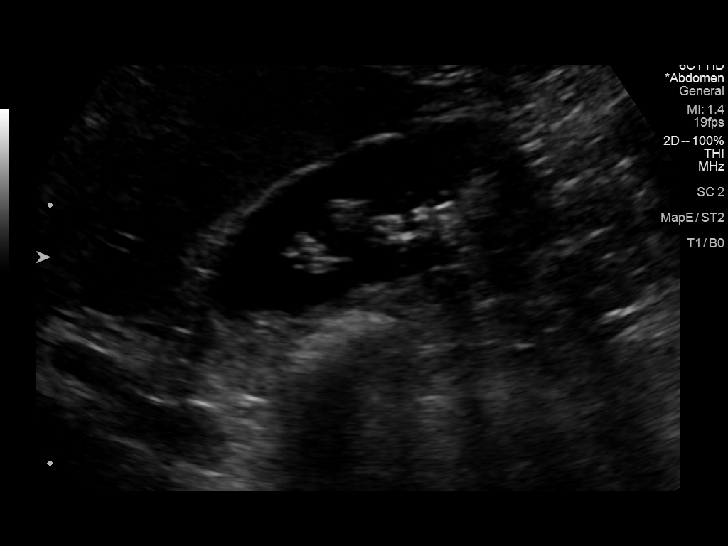
[im 16/46]
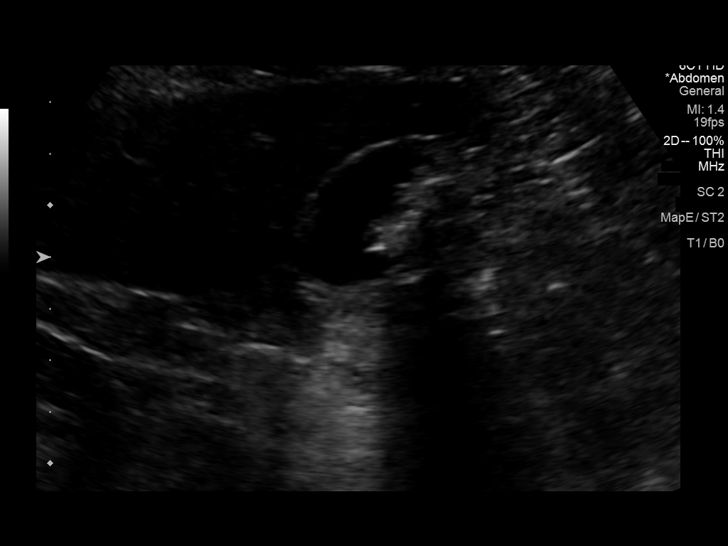
[im 17/46]
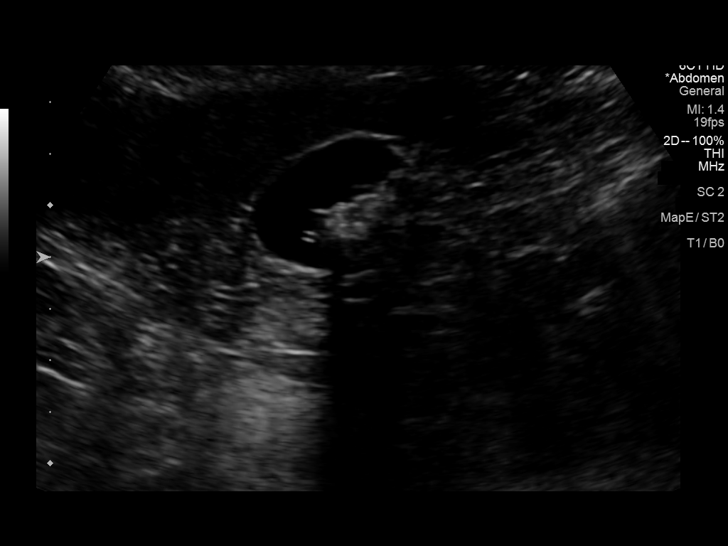
[im 21/46]
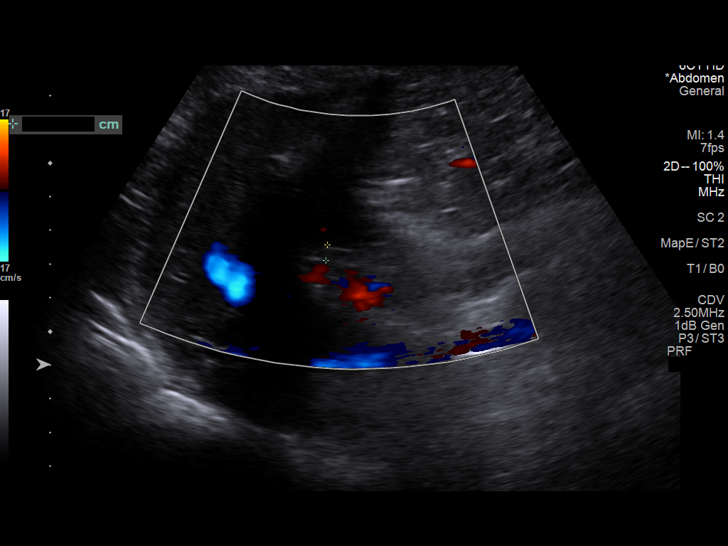
[im 25/46]
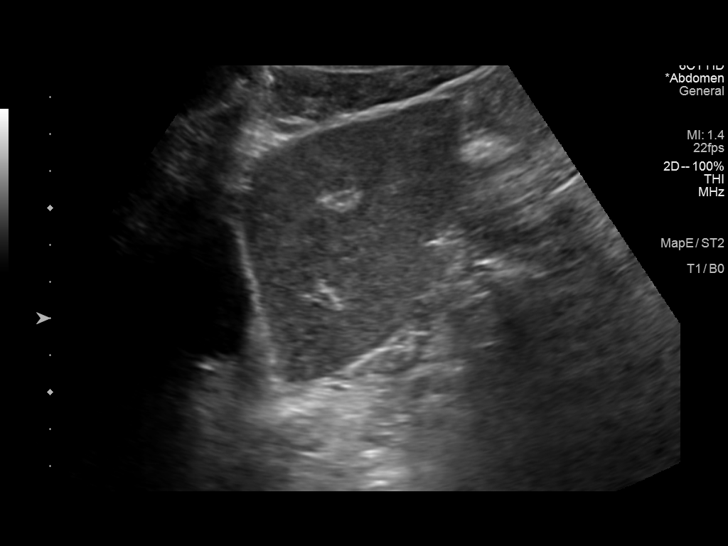
[im 29/46]
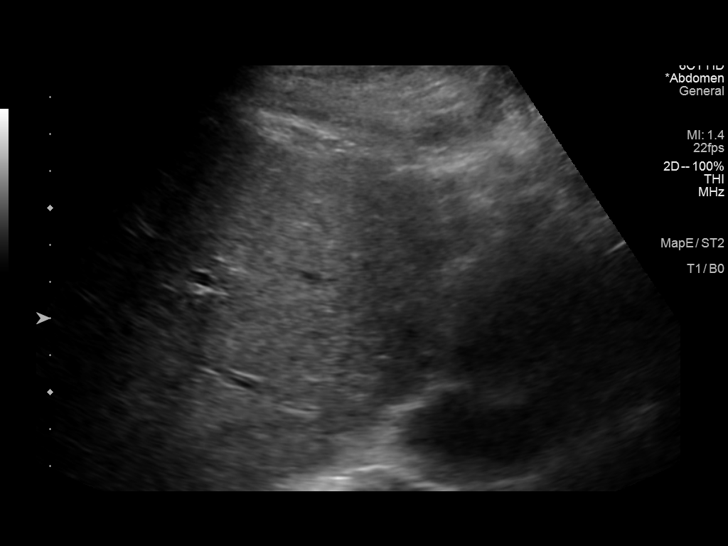
[im 31/46]
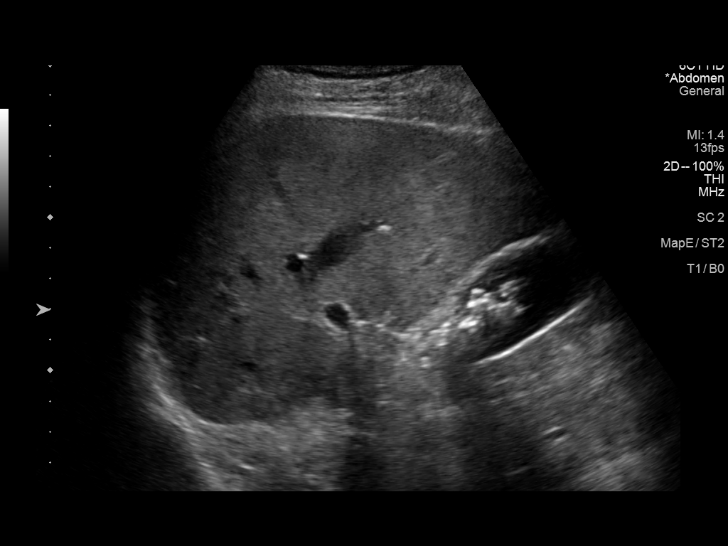
[im 34/46]
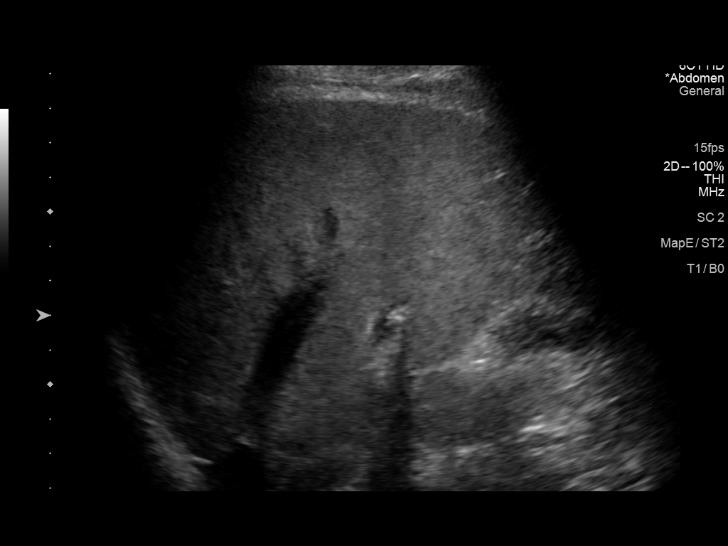
[im 38/46]
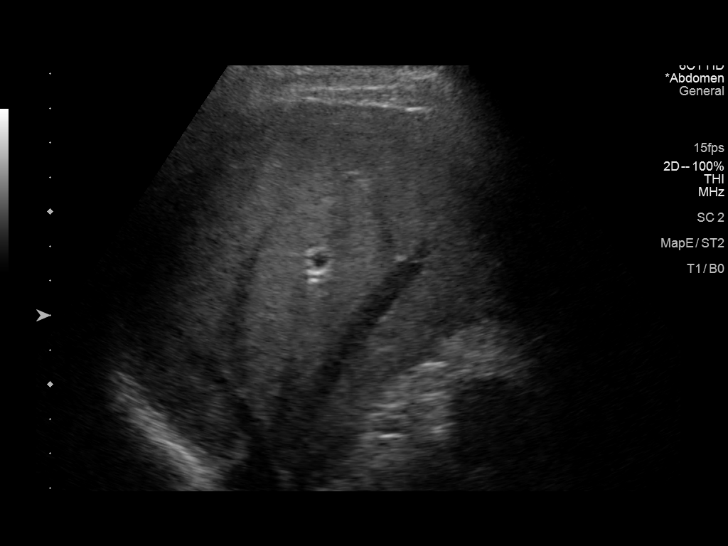
[im 42/46]
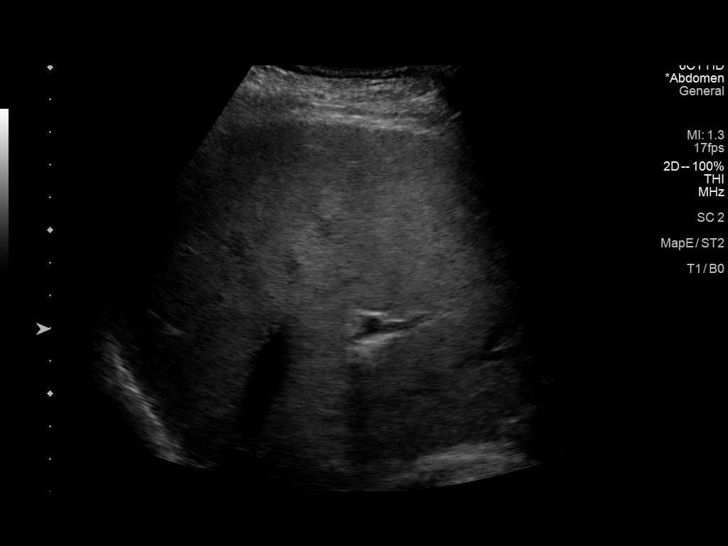
[im 46/46]
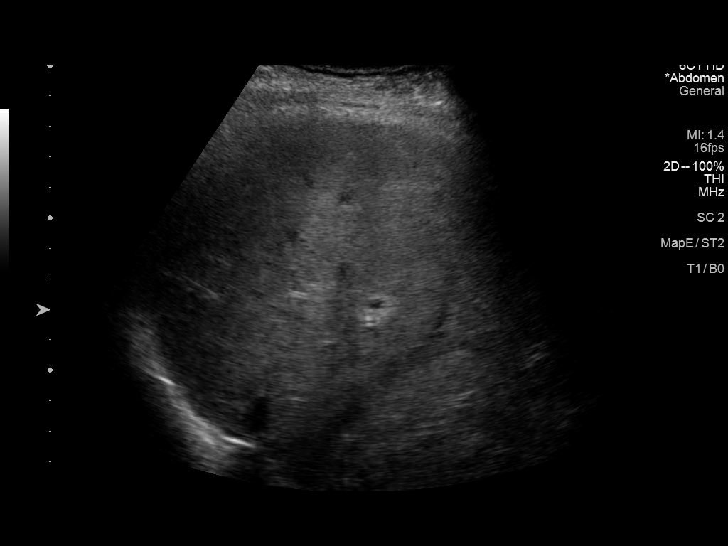

[14 of 25 positions shown; findings below may reference images not displayed]

FINDINGS: Gallbladder:

Multiple layering stones in the lumen, measuring up to 1.3 cm
diameter. Wall thickness measures up to 2.3 mm. No pericholecystic
fluid. Sonographer describes no sonographic Murphy's sign.

Common bile duct:

Diameter: 4.7 mm, unremarkable

Liver:

No focal lesion identified. Within normal limits in parenchymal
echogenicity. Portal vein is patent on color Doppler imaging with
normal direction of blood flow towards the liver.

Other: None.
IMPRESSION: 1. Negative for liver mass.
2. Cholelithiasis without other ultrasound evidence of cholecystitis
or biliary obstruction.

## 2021-08-17 DIAGNOSIS — R972 Elevated prostate specific antigen [PSA]: Secondary | ICD-10-CM | POA: Diagnosis not present

## 2021-09-22 DIAGNOSIS — M5412 Radiculopathy, cervical region: Secondary | ICD-10-CM | POA: Diagnosis not present

## 2021-09-22 DIAGNOSIS — Z6827 Body mass index (BMI) 27.0-27.9, adult: Secondary | ICD-10-CM | POA: Diagnosis not present

## 2021-09-22 DIAGNOSIS — M48062 Spinal stenosis, lumbar region with neurogenic claudication: Secondary | ICD-10-CM | POA: Diagnosis not present

## 2021-09-22 DIAGNOSIS — I10 Essential (primary) hypertension: Secondary | ICD-10-CM | POA: Diagnosis not present

## 2021-11-11 NOTE — Progress Notes (Signed)
? ?Phone [phone] ?In person visit ?  ?Subjective:  ? ?Anthony Mann is a 76 y.o. year old very pleasant male patient who presents for/with See problem oriented charting ?Chief Complaint  ?Patient presents with  ? Follow-up  ? Hyperlipidemia  ? Hypertension  ? ? ?This visit occurred during the SARS-CoV-2 public health emergency.  Safety protocols were in place, including screening questions prior to the visit, additional usage of staff PPE, and extensive cleaning of exam room while observing appropriate contact time as indicated for disinfecting solutions.  ? ?Past Medical History-  ?Patient Active Problem List  ? Diagnosis Date Noted  ? Prostate cancer ([facility]) 08/03/2017  ?  Priority: High  ? Hepatitis B   ?  Priority: High  ? Depressed mood 06/27/2017  ?  Priority: Medium   ? Hyperlipidemia 07/16/2014  ?  Priority: Medium   ? HTN (hypertension) 10/14/2010  ?  Priority: Medium   ? Lumbar pseudoarthrosis 04/29/2021  ? Atypical chest pain 12/10/2019  ? Low back pain 12/04/2019  ? Laryngopharyngeal reflux (LPR) 10/30/2019  ? History of adenomatous polyp of colon 05/11/2019  ? Osteoarthritis of right knee 11/29/2017  ? BPH associated with nocturia 01/26/2016  ? ? ?Medications- reviewed and updated ?Current Outpatient Medications  ?Medication Sig Dispense Refill  ? amLODipine (NORVASC) 5 MG tablet TAKE 1 TABLET BY MOUTH EVERY DAY 90 tablet 3  ? aspirin EC 81 MG tablet Take 81 mg by mouth daily.    ? finasteride (PROSCAR) 5 MG tablet Take 5 mg by mouth daily.    ? hydrochlorothiazide (HYDRODIURIL) 25 MG tablet TAKE 1 TABLET BY MOUTH EVERY DAY 90 tablet 3  ? irbesartan (AVAPRO) 300 MG tablet TAKE 1 TABLET BY MOUTH EVERY DAY 90 tablet 3  ? rosuvastatin (CRESTOR) 10 MG tablet TAKE 1 TABLET BY MOUTH EVERY DAY 90 tablet 3  ? ?No current facility-administered medications for this visit.  ? ?  ?Objective:  ?BP 132/74   Pulse 61   Temp 98.3 ?F (36.8 ?C)   Ht 5\' 5"  (1.651 m)   Wt 167 lb 6.4 oz (75.9 kg)   SpO2 98%   BMI  27.86 kg/m?  ?Gen: NAD, resting comfortably ?TM normal on right (slight decreased field/narrow canal), much more narrow on left- and less visible- appears to have protrusion into canal- declines ENT ?CV: RRR no murmurs rubs or gallops ?Lungs: CTAB no crackles, wheeze, rhonchi ?Ext: no edema ?Skin: warm, dry ?  ? ?Assessment and Plan  ? ?#hyperlipidemia ?S: Medication: Rosuvastatin 10 mg every 3rd day -reduced from daily, still prefers to take aspirin 81mg  ?Lab Results  ?Component Value Date  ? CHOL 106 11/11/2020  ? HDL 50.40 11/11/2020  ? [place] 29 11/11/2020  ? LDLDIRECT 60.0 05/20/2021  ? TRIG 134.0 11/11/2020  ? CHOLHDL 2 11/11/2020  ? A/P: Goal LDL under 70-update lipid panel with labs today. Likely continue current meds ? ?#hypertension ?S: medication: Irbesartan 300 mg daily , HCTZ 25 mg daily , Amlodipine 5 mg daily ?Home readings #s: 127/74 this morning ?BP Readings from Last 3 Encounters:  ?11/18/21 132/74  ?05/20/21 126/69  ?04/30/21 126/61  ?A/P: Controlled. Continue current medications.  ? ?#Chronic hepatitis B ?S: Ultrasound stable in 2021 with no hepatocellular carcinoma-due for repeat at this time  ?-Hepatitis B DNA was trended up and was over 2000 but ALT was less than 2 times upper limit of normal and patient declined ID referral at that time- then trended back down ?-very rare alcohol ?A/P:  levels improved last visit and ultrasound reassuring (plan for annual)- recheck hep b dna today- no change in meds ? ?#Prostate cancer- remains on active surveillance with urology- reports levels have trended down 4.5 or 5on finasteride ?Lab Results  ?Component Value Date  ? PSA 16.20 02/16/2021  ? PSA 11.60 03/06/2020  ? PSA 12.40 08/17/2019  ? ? from last note " ?#Narrowed left ear canal-there seemed to be a slight bulge on the left lower portion of the ear canal and there was another slight bulge-I am only able to see about 10% of the top of the tympanic membrane as a result-recommended ENT referral-I am  not sure what the growth is-he declined around the time and was not having any symptoms but he agreed to let me know if he changed his mind after discussing with ?-does have connection with Dr. Constance Holster " ?- no change today and he wants to hold off ? ?#February 2021 with cervical radiculopathy and surgery- still has weakness in the left arm/shoulder  but improved ?  ?# Laminectomy/Laminotomy surgery - 04/29/2021-04/30/2021 of L1-L2, L3-L4, L4-L5 ?- off pain meds ?-patient reports has done very well and pain much improved  unless really active.  ?  ?#HM- see avs ? ?Recommended follow up: Return in about 6 months (around 05/21/2022) for physical or sooner if needed. ? ?Lab/Order associations: ?  ICD-10-CM   ?1. Hyperlipidemia, unspecified hyperlipidemia type  E78.5   ?  ?2. Essential hypertension  I10   ?  ?3. Chronic viral hepatitis B without delta agent and without coma (HCC)  B18.1   ?  ?4. Prostate cancer ([place])  C61   ?  ?5. Primary hypertension  I10   ?  ? ?No orders of the defined types were placed in this encounter. ? ?I,Jada Bradford,acting as a scribe for Garret Reddish, MD.,have documented all relevant documentation on the behalf of Garret Reddish, MD,as directed by  Garret Reddish, MD while in the presence of Garret Reddish, MD. ? ?I, Garret Reddish, MD, have reviewed all documentation for this visit. The documentation on 11/18/21 for the exam, diagnosis, procedures, and orders are all accurate and complete. ? ?Return precautions advised.  ?Garret Reddish, MD ? ? ?

## 2021-11-13 ENCOUNTER — Other Ambulatory Visit: Payer: Self-pay | Admitting: Family Medicine

## 2021-11-18 ENCOUNTER — Ambulatory Visit (INDEPENDENT_AMBULATORY_CARE_PROVIDER_SITE_OTHER): Payer: Medicare HMO | Admitting: Family Medicine

## 2021-11-18 ENCOUNTER — Encounter: Payer: Self-pay | Admitting: Family Medicine

## 2021-11-18 VITALS — BP 132/74 | HR 61 | Temp 98.3°F | Ht 65.0 in | Wt 167.4 lb

## 2021-11-18 DIAGNOSIS — B181 Chronic viral hepatitis B without delta-agent: Secondary | ICD-10-CM

## 2021-11-18 DIAGNOSIS — I10 Essential (primary) hypertension: Secondary | ICD-10-CM

## 2021-11-18 DIAGNOSIS — C61 Malignant neoplasm of prostate: Secondary | ICD-10-CM | POA: Diagnosis not present

## 2021-11-18 DIAGNOSIS — E785 Hyperlipidemia, unspecified: Secondary | ICD-10-CM | POA: Diagnosis not present

## 2021-11-18 DIAGNOSIS — R69 Illness, unspecified: Secondary | ICD-10-CM | POA: Diagnosis not present

## 2021-11-18 LAB — COMPREHENSIVE METABOLIC PANEL
ALT: 13 U/L (ref 0–53)
AST: 17 U/L (ref 0–37)
Albumin: 4.8 g/dL (ref 3.5–5.2)
Alkaline Phosphatase: 40 U/L (ref 39–117)
BUN: 16 mg/dL (ref 6–23)
CO2: 25 mEq/L (ref 19–32)
Calcium: 10.1 mg/dL (ref 8.4–10.5)
Chloride: 101 mEq/L (ref 96–112)
Creatinine, Ser: 1.01 mg/dL (ref 0.40–1.50)
GFR: 73.28 mL/min (ref >60.00)
Glucose, Bld: 92 mg/dL (ref 70–99)
Potassium: 4.1 mEq/L (ref 3.5–5.1)
Sodium: 136 mEq/L (ref 135–145)
Total Bilirubin: 1.1 mg/dL (ref 0.2–1.2)
Total Protein: 7.2 g/dL (ref 6.0–8.3)

## 2021-11-18 LAB — LIPID PANEL
Cholesterol: 118 mg/dL (ref 0–200)
HDL: 53.1 mg/dL (ref >39.00)
LDL Cholesterol: 45 mg/dL (ref 0–99)
NonHDL: 64.67
Total CHOL/HDL Ratio: 2
Triglycerides: 98 mg/dL (ref 0.0–149.0)
VLDL: 19.6 mg/dL (ref 0.0–40.0)

## 2021-11-18 NOTE — Patient Instructions (Addendum)
Send [place] the dates of your Pneumonia and Shingrix shots from the [place]. ? ?Please stop by lab before you go ?If you have mychart- we will send your results within 3 business days of [place] receiving them.  ?If you do not have mychart- we will call you about results within 5 business days of [place] receiving them.  ?*please also note that you will see labs on mychart as soon as they post. I will later go in and write notes on them- will say "notes from Dr. Yong Channel"  ? ?Recommended follow up: Return in about 6 months (around 05/21/2022) for physical or sooner if needed.  ?

## 2021-11-20 LAB — HEPATITIS B DNA, ULTRAQUANTITATIVE, PCR
Hepatitis B DNA (Calc): 2.9 Log IU/mL — ABNORMAL HIGH
Hepatitis B DNA: 786 IU/mL — ABNORMAL HIGH

## 2021-12-30 ENCOUNTER — Other Ambulatory Visit: Payer: Self-pay | Admitting: Urology

## 2021-12-30 DIAGNOSIS — R972 Elevated prostate specific antigen [PSA]: Secondary | ICD-10-CM

## 2022-01-12 DIAGNOSIS — M5412 Radiculopathy, cervical region: Secondary | ICD-10-CM | POA: Diagnosis not present

## 2022-01-12 DIAGNOSIS — Z6827 Body mass index (BMI) 27.0-27.9, adult: Secondary | ICD-10-CM | POA: Diagnosis not present

## 2022-01-12 DIAGNOSIS — M4180 Other forms of scoliosis, site unspecified: Secondary | ICD-10-CM | POA: Diagnosis not present

## 2022-02-10 ENCOUNTER — Telehealth: Payer: Self-pay | Admitting: Family Medicine

## 2022-02-10 NOTE — Telephone Encounter (Signed)
Copied from [place] [phone]. Topic: Medicare AWV >> Feb 10, 2022 10:38 AM Harris-Coley, Hannah Beat wrote: Reason for CRM: Left message for patient to schedule Annual Wellness Visit.  Please schedule with Nurse Health Advisor Charlott Rakes, RN at [facility].  Please call [phone] ask for [place] [facility]

## 2022-02-10 NOTE — Telephone Encounter (Signed)
Spoke with spouse she stated she will give patient message to call for his AWV

## 2022-02-15 ENCOUNTER — Ambulatory Visit
Admission: RE | Admit: 2022-02-15 | Discharge: 2022-02-15 | Disposition: A | Discharge status: Home / Self Care | Payer: Medicare HMO | Source: Ambulatory Visit | Attending: Urology | Admitting: Urology

## 2022-02-15 DIAGNOSIS — C61 Malignant neoplasm of prostate: Secondary | ICD-10-CM | POA: Diagnosis not present

## 2022-02-15 DIAGNOSIS — N400 Enlarged prostate without lower urinary tract symptoms: Secondary | ICD-10-CM | POA: Diagnosis not present

## 2022-02-15 DIAGNOSIS — R972 Elevated prostate specific antigen [PSA]: Secondary | ICD-10-CM

## 2022-02-15 DIAGNOSIS — R590 Localized enlarged lymph nodes: Secondary | ICD-10-CM | POA: Diagnosis not present

## 2022-02-15 IMAGING — MR MR PROSTATE WO/W CM
12 series · 48 of 48 positions shown · IV contrast (multihance)
Comparison: [DATE].  Pathology results of [DATE].

CLINICAL DATA: Active surveillance of prostate cancer.  PSA of 5.6.

EXAM:
MR PROSTATE WITHOUT AND WITH CONTRAST
TECHNIQUE: Multiplanar multisequence MRI images were obtained of the pelvis
centered about the prostate. Pre and post contrast images were
obtained.
CONTRAST:  15mL MULTIHANCE GADOBENATE DIMEGLUMINE 529 MG/ML IV SOLN

[Series 3: T2 · coronal · 3.0mm · 0.56mm/px · 1 of 25 slices shown (1 of 3)]
[im 1/25]
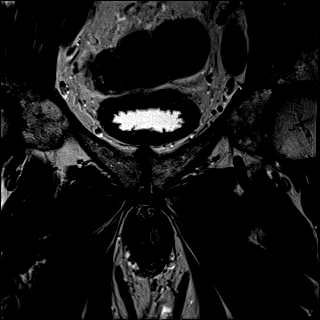

[Series 4: T1 · axial · 5.0mm · 1.25mm/px · z∈[-44,+191]mm · 2 of 96 slices shown]
[im 1/96]
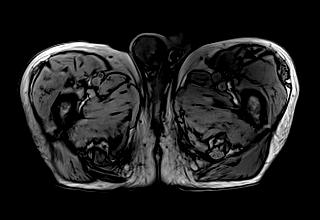
[im 96/96]
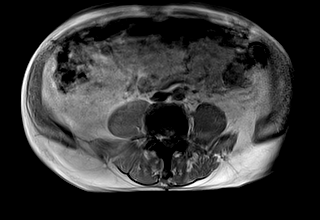

[Series 5: DWI · axial · 3.0mm · 1.75mm/px · z∈[-15,+54]mm · 2 of 72 slices shown (1 of 3)]
[im 1/72]
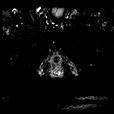
[im 72/72]
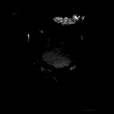

[Series 6: DWI · axial · 3.0mm · 1.75mm/px · 1 of 24 slices shown (2 of 3)]
[im 1/24]
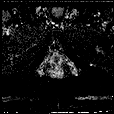

[Series 7: DWI · axial · 3.0mm · 1.75mm/px · 1 of 24 slices shown (3 of 3)]
[im 1/24]
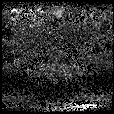

[Series 8: T2 · axial · 3.0mm · 0.56mm/px · 1 of 23 slices shown (2 of 3)]
[im 1/23]
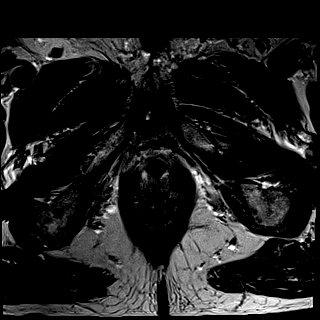

[Series 9: T2 · axial · 1.0mm · 1.04mm/px · z∈[-16,+55]mm · 2 of 72 slices shown (3 of 3)]
[im 1/72]
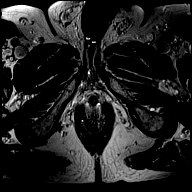
[im 72/72]
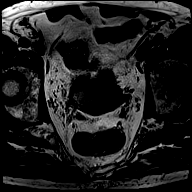

[Series 10: pre t1_twist_tra_dyn · axial · non-contrast · 3.5mm · 0.83mm/px · 1 of 20 slices shown]
[im 1/20]
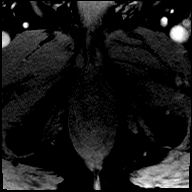

[Series 11: post t1_twist_tra_dyn-copy center · axial · non-contrast · 3.5mm · 0.83mm/px · z∈[-13,+53]mm · 17 of 600 slices shown]
[im 1/600]
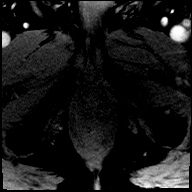
[im 38/600]
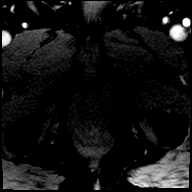
[im 75/600]
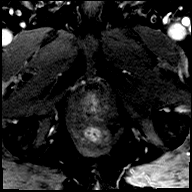
[im 113/600]
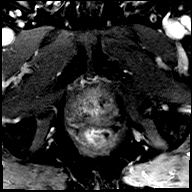
[im 150/600]
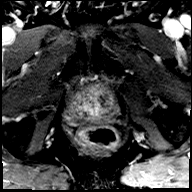
[im 188/600]
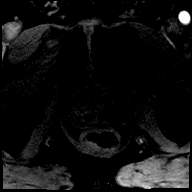
[im 225/600]
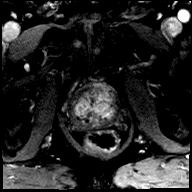
[im 263/600]
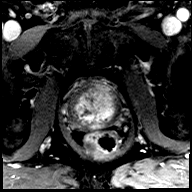
[im 300/600]
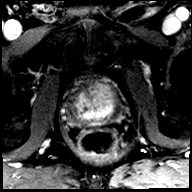
[im 337/600]
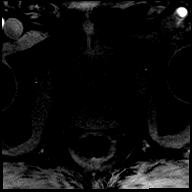
[im 375/600]
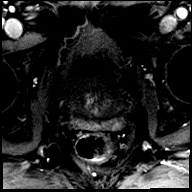
[im 412/600]
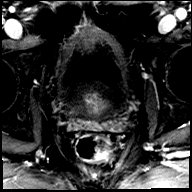
[im 450/600]
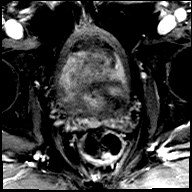
[im 487/600]
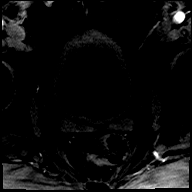
[im 525/600]
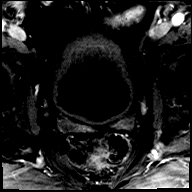
[im 562/600]
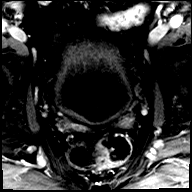
[im 600/600]
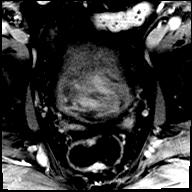

[Series 12: post t1_twist_tra_dyn-copy cent_sub · axial · 3.5mm · 0.83mm/px · z∈[-13,+53]mm · 16 of 579 slices shown]
[im 1/579]
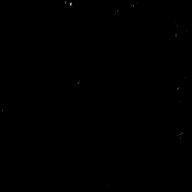
[im 39/579]
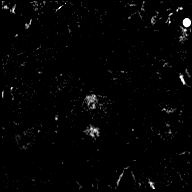
[im 78/579]
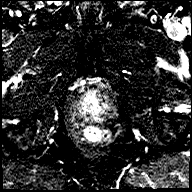
[im 116/579]
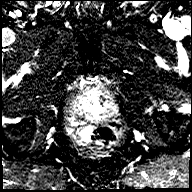
[im 155/579]
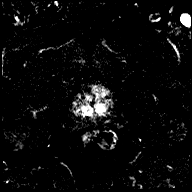
[im 193/579]
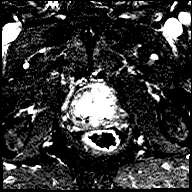
[im 232/579]
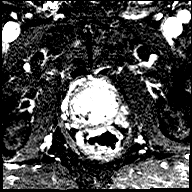
[im 270/579]
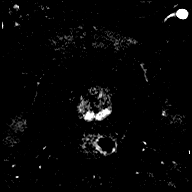
[im 309/579]
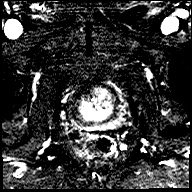
[im 347/579]
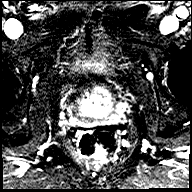
[im 386/579]
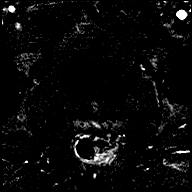
[im 424/579]
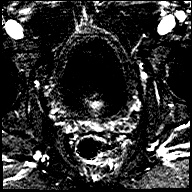
[im 463/579]
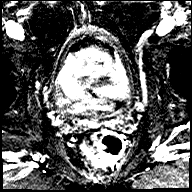
[im 501/579]
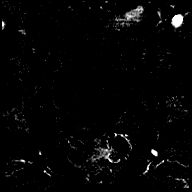
[im 540/579]
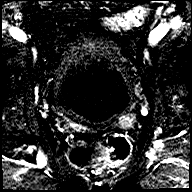
[im 579/579]
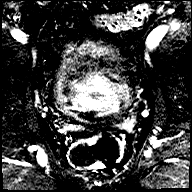

[Series 13: t1_vibe_dixon_tra_f · axial · 2.5mm · 0.91mm/px · z∈[-25,+173]mm · 2 of 80 slices shown]
[im 1/80]
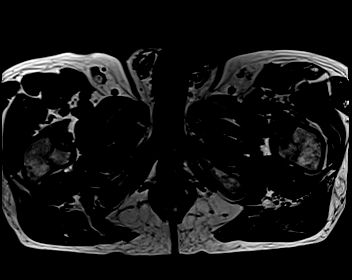
[im 80/80]
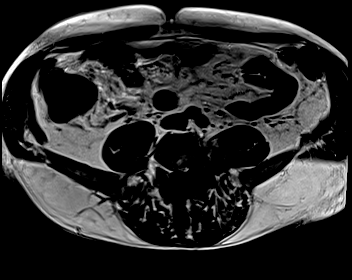

[Series 14: t1_vibe_dixon_tra_w · axial · 2.5mm · 0.91mm/px · z∈[-25,+173]mm · 2 of 80 slices shown]
[im 1/80]
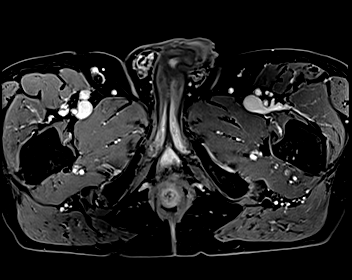
[im 80/80]
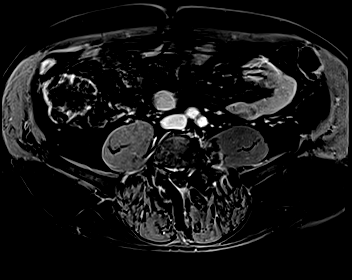

[48 of 48 positions shown; findings below may reference images not displayed]

FINDINGS: Prostate: Mild central gland enlargement and heterogeneity,
consistent with benign prostatic hyperplasia. No dominant central
gland nodule.

The peripheral zone is mildly compressed. Within the left posterior
apical zone a 5 mm area of subtle T2 hypointensity on [DATE] is felt
to correspond to decreased signal on ADC map [DATE]. No correlate on
long B value diffusion-weighted or postcontrast dynamic imaging.

Volume: 4.1 x 3.8 x 5.3 cm (volume = 43 cm^3)

Transcapsular spread:  Absent

Seminal vesicle involvement: Absent

Neurovascular bundle involvement: Absent

Pelvic adenopathy: Absent

Bone metastasis: Absent

Other findings: No significant free fluid. Normal pelvic bowel loops
and urinary bladder.
IMPRESSION: 1. Subtle multiparametric signal abnormality within the posterior
left apical peripheral zone. Indeterminate for small volume disease.
PI-RADS(v2.1)-3.
2.  No evidence of locally advanced or pelvic metastatic disease.
3.  (I have post-processed this exam in the DynaCAD application for
potential fusion-guided biopsy.)

## 2022-02-15 MED ORDER — GADOBENATE DIMEGLUMINE 529 MG/ML IV SOLN
15.0000 mL | Freq: Once | INTRAVENOUS | Status: AC | PRN
  Administered 2022-02-15: 15 mL via INTRAVENOUS

## 2022-03-12 ENCOUNTER — Other Ambulatory Visit: Payer: Self-pay | Admitting: Family Medicine

## 2022-03-12 DIAGNOSIS — Z8546 Personal history of malignant neoplasm of prostate: Secondary | ICD-10-CM | POA: Diagnosis not present

## 2022-04-26 ENCOUNTER — Encounter: Payer: Self-pay | Admitting: Internal Medicine

## 2022-05-06 ENCOUNTER — Ambulatory Visit (AMBULATORY_SURGERY_CENTER): Payer: Medicare HMO

## 2022-05-06 VITALS — Ht 65.0 in | Wt 166.0 lb

## 2022-05-06 DIAGNOSIS — Z86010 Personal history of colonic polyps: Secondary | ICD-10-CM

## 2022-05-06 MED ORDER — NA SULFATE-K SULFATE-MG SULF 17.5-3.13-1.6 GM/177ML PO SOLN
1.0000 | ORAL | 0 refills | Status: DC
Start: 2022-05-06 — End: 2022-06-17

## 2022-05-06 NOTE — Progress Notes (Signed)
No egg or soy allergy known to patient  No issues known to pt with past sedation with any surgeries or procedures Patient denies ever being told they had issues or difficulty with intubation  No FH of Malignant Hyperthermia Pt is not on diet pills Pt is not on  home 02  Pt is not on blood thinners  Pt denies issues with constipation  No A fib or A flutter Have any cardiac testing pending--denied Pt instructed to use Singlecare.com or GoodRx for a price reduction on prep   

## 2022-05-25 ENCOUNTER — Encounter: Payer: Self-pay | Admitting: Family Medicine

## 2022-05-25 ENCOUNTER — Ambulatory Visit (INDEPENDENT_AMBULATORY_CARE_PROVIDER_SITE_OTHER): Payer: Medicare HMO | Admitting: Family Medicine

## 2022-05-25 VITALS — BP 110/64 | HR 73 | Temp 98.3°F | Ht 65.0 in | Wt 167.2 lb

## 2022-05-25 DIAGNOSIS — E785 Hyperlipidemia, unspecified: Secondary | ICD-10-CM

## 2022-05-25 DIAGNOSIS — B181 Chronic viral hepatitis B without delta-agent: Secondary | ICD-10-CM | POA: Diagnosis not present

## 2022-05-25 DIAGNOSIS — I10 Essential (primary) hypertension: Secondary | ICD-10-CM

## 2022-05-25 DIAGNOSIS — Z23 Encounter for immunization: Secondary | ICD-10-CM | POA: Diagnosis not present

## 2022-05-25 DIAGNOSIS — R69 Illness, unspecified: Secondary | ICD-10-CM | POA: Diagnosis not present

## 2022-05-25 LAB — CBC WITH DIFFERENTIAL/PLATELET
Basophils Absolute: 0 10*3/uL (ref 0.0–0.1)
Basophils Relative: 0.5 % (ref 0.0–3.0)
Eosinophils Absolute: 0.1 10*3/uL (ref 0.0–0.7)
Eosinophils Relative: 1.7 % (ref 0.0–5.0)
HCT: 43 % (ref 39.0–52.0)
Hemoglobin: 14.6 g/dL (ref 13.0–17.0)
Lymphocytes Relative: 32.4 % (ref 12.0–46.0)
Lymphs Abs: 1.8 10*3/uL (ref 0.7–4.0)
MCHC: 34 g/dL (ref 30.0–36.0)
MCV: 95.1 fl (ref 78.0–100.0)
Monocytes Absolute: 0.6 10*3/uL (ref 0.1–1.0)
Monocytes Relative: 11 % (ref 3.0–12.0)
Neutro Abs: 3.1 10*3/uL (ref 1.4–7.7)
Neutrophils Relative %: 54.4 % (ref 43.0–77.0)
Platelets: 197 10*3/uL (ref 150.0–400.0)
RBC: 4.52 Mil/uL (ref 4.22–5.81)
RDW: 13.1 % (ref 11.5–15.5)
WBC: 5.6 10*3/uL (ref 4.0–10.5)

## 2022-05-25 LAB — COMPREHENSIVE METABOLIC PANEL
ALT: 14 U/L (ref 0–53)
AST: 17 U/L (ref 0–37)
Albumin: 4.3 g/dL (ref 3.5–5.2)
Alkaline Phosphatase: 34 U/L — ABNORMAL LOW (ref 39–117)
BUN: 20 mg/dL (ref 6–23)
CO2: 27 mEq/L (ref 19–32)
Calcium: 10 mg/dL (ref 8.4–10.5)
Chloride: 102 mEq/L (ref 96–112)
Creatinine, Ser: 1.07 mg/dL (ref 0.40–1.50)
GFR: 68.13 mL/min (ref >60.00)
Glucose, Bld: 90 mg/dL (ref 70–99)
Potassium: 4.2 mEq/L (ref 3.5–5.1)
Sodium: 137 mEq/L (ref 135–145)
Total Bilirubin: 0.9 mg/dL (ref 0.2–1.2)
Total Protein: 7 g/dL (ref 6.0–8.3)

## 2022-05-25 NOTE — Progress Notes (Signed)
Phone [phone] In person visit   Subjective:   Anthony Mann is a 76 y.o. year old very pleasant male patient who presents for/with See problem oriented charting Chief Complaint  Patient presents with   Follow-up   Hyperlipidemia   Hypertension    Past Medical History-  Patient Active Problem List   Diagnosis Date Noted   Prostate cancer ([place]) 08/03/2017    Priority: High   Hepatitis B     Priority: High   Laryngopharyngeal reflux (LPR) 10/30/2019    Priority: Medium    Depressed mood 06/27/2017    Priority: Medium    BPH associated with nocturia 01/26/2016    Priority: Medium    Hyperlipidemia 07/16/2014    Priority: Medium    HTN (hypertension) 10/14/2010    Priority: Medium    History of adenomatous polyp of colon 05/11/2019    Priority: Low   Lumbar pseudoarthrosis 04/29/2021    Priority: 1.   Low back pain 12/04/2019    Priority: 1.   Osteoarthritis of right knee 11/29/2017    Priority: 1.   Atypical chest pain 12/10/2019    Medications- reviewed and updated Current Outpatient Medications  Medication Sig Dispense Refill   amLODipine (NORVASC) 5 MG tablet TAKE 1 TABLET BY MOUTH EVERY DAY 90 tablet 3   aspirin EC 81 MG tablet Take 81 mg by mouth daily.     finasteride (PROSCAR) 5 MG tablet Take 5 mg by mouth daily.     hydrochlorothiazide (HYDRODIURIL) 25 MG tablet TAKE 1 TABLET BY MOUTH EVERY DAY 90 tablet 3   irbesartan (AVAPRO) 300 MG tablet TAKE 1 TABLET BY MOUTH EVERY DAY 90 tablet 3   Na Sulfate-K Sulfate-Mg Sulf 17.5-3.13-1.6 GM/177ML SOLN Take 1 kit by mouth as directed. May use generic Suprep, no prior authorization. Take as directed. 354 mL 0   rosuvastatin (CRESTOR) 10 MG tablet TAKE 1 TABLET BY MOUTH EVERY DAY 90 tablet 3   No current facility-administered medications for this visit.     Objective:  BP 110/64   Pulse 73   Temp 98.3 F (36.8 C)   Ht '5\' 5"'  (1.651 m)   Wt 167 lb 3.2 oz (75.8 kg)   SpO2 98%   BMI 27.82 kg/m  Gen: NAD,  resting comfortably CV: RRR no murmurs rubs or gallops Lungs: CTAB no crackles, wheeze, rhonchi Ext: trace edema Skin: warm, dry    Assessment and Plan   #Chronic Hepatitis B S:Hepatitis B DNA has trended up over 2000 in the past but ALT remains less than 2 times upper limit of normal and later hepatitis B trended back down -Very rare alcohol -reports one beer every 2 weeks  -Annual ultrasound to rule out hepatocellular carcinoma-last a year ago  A/P: will check Hep B labs but he wants to hold off on ultrasound until physical. Also check cmp   #hypertension S: medication: Amlodipine 5 mg, hydrochlorothiazide 25 mg, irbesartan 300 mg Home readings #s: similar readings- mainly 120s or 130s though- occasionally lower. Diastolic 00-34 -working out, jogging, eating well- thinks helping his BP BP Readings from Last 3 Encounters:  05/25/22 110/64  11/18/21 132/74  05/20/21 126/69  A/P: Controlled. Continue current medications.   #hyperlipidemia S: Medication:Rosuvastatin 10 mg listed as daily but taking every third day and has still been controlled, aspirin 81 mg- prefers for primary prevention Lab Results  Component Value Date   CHOL 118 11/18/2021   HDL 53.10 11/18/2021   LDLCALC 45 11/18/2021   LDLDIRECT  60.0 05/20/2021   TRIG 98.0 11/18/2021   CHOLHDL 2 11/18/2021  A/P: cholesterol looked great last viist- continue current med-s recheck next visit   #Prostate cancer-remains on active surveillance with urology-PSA levels have trended down on finasteride- reports recent check #BPH with nocturia S: Medication: Finasteride 5 mg  A/P: BPH symptoms stable- obviously prostate cancer could contribute to symptoms- continue finasteride as recommended by urology    #Narrowed left ear canal-from prior notes "there seemed to be a slight bulge on the left lower portion of the ear canal and there was another slight bulge-I am only able to see about 10% of the top of the tympanic membrane as a  result-recommended ENT referral-I am not sure what the growth is-he declined around the time and was not having any symptoms but he agreed to let me know if he changed his mind after discussing with -does have connection with Dr. Constance Holster "" -Today stable but declines referral - he actually reports that he had a visit several years ago (I was unable to locate)  #History of adenomatous polyp of colon-last completed 05/08/2019 with 3-year repeat planned-adenoma was 03/24/2016 colonoscopy- he is already scheduled  Recommended follow up: Return in about 6 months (around 11/23/2022) for physical or sooner if needed.Schedule b4 you leave. Future Appointments  Date Time Provider Andrew  06/17/2022  8:30 AM Irene Shipper, MD [facility]   Lab/Order associations:   ICD-10-CM   1. Chronic viral hepatitis B without delta agent and without coma (HCC)  B18.1 Comprehensive metabolic panel    Hepatitis B DNA, ultraquantitative, PCR    2. Primary hypertension  I10 CBC with Differential/Platelet    3. Hyperlipidemia, unspecified hyperlipidemia type  E78.5      Return precautions advised.  Garret Reddish, MD

## 2022-05-25 NOTE — Patient Instructions (Addendum)
Please stop by lab before you go If you have mychart- we will send your results within 3 business days of [place] receiving them.  If you do not have mychart- we will call you about results within 5 business days of [place] receiving them.  *please also note that you will see labs on mychart as soon as they post. I will later go in and write notes on them- will say "notes from Dr. Yong Channel"   Prevnar 20 today.  Send [place] a message within 2 -3 weeks- we are hoping to have high dose by then  Recommended follow up: Return in about 6 months (around 11/23/2022) for physical or sooner if needed.Schedule b4 you leave.

## 2022-05-25 NOTE — Addendum Note (Signed)
Addended by: Clyde Lundborg A on: 05/25/2022 11:55 AM   Modules accepted: Orders

## 2022-05-27 LAB — HEPATITIS B DNA, ULTRAQUANTITATIVE, PCR
Hepatitis B DNA: 767 IU/mL — ABNORMAL HIGH
Hepatitis B virus DNA: 2.88 Log IU/mL — ABNORMAL HIGH

## 2022-06-01 ENCOUNTER — Encounter: Payer: Self-pay | Admitting: Internal Medicine

## 2022-06-07 ENCOUNTER — Encounter: Payer: Self-pay | Admitting: *Deleted

## 2022-06-14 ENCOUNTER — Ambulatory Visit (INDEPENDENT_AMBULATORY_CARE_PROVIDER_SITE_OTHER): Payer: Medicare HMO | Admitting: *Deleted

## 2022-06-14 DIAGNOSIS — Z23 Encounter for immunization: Secondary | ICD-10-CM | POA: Diagnosis not present

## 2022-06-17 ENCOUNTER — Ambulatory Visit (AMBULATORY_SURGERY_CENTER): Payer: Medicare HMO | Admitting: Internal Medicine

## 2022-06-17 ENCOUNTER — Encounter: Payer: Self-pay | Admitting: Internal Medicine

## 2022-06-17 VITALS — BP 113/59 | HR 68 | Temp 97.1°F | Resp 18 | Ht 65.0 in | Wt 166.0 lb

## 2022-06-17 DIAGNOSIS — Z86010 Personal history of colonic polyps: Secondary | ICD-10-CM | POA: Diagnosis not present

## 2022-06-17 DIAGNOSIS — D123 Benign neoplasm of transverse colon: Secondary | ICD-10-CM | POA: Diagnosis not present

## 2022-06-17 DIAGNOSIS — Z09 Encounter for follow-up examination after completed treatment for conditions other than malignant neoplasm: Secondary | ICD-10-CM | POA: Diagnosis not present

## 2022-06-17 MED ORDER — SODIUM CHLORIDE 0.9 % IV SOLN: 500.0000 mL | Freq: Once | INTRAVENOUS | Status: DC

## 2022-06-17 NOTE — Progress Notes (Signed)
Sedate, gd SR, tolerated procedure well, VSS, report to RN 

## 2022-06-17 NOTE — Progress Notes (Signed)
Called to room to assist during endoscopic procedure.  Patient ID and intended procedure confirmed with present staff. Received instructions for my participation in the procedure from the performing physician.  

## 2022-06-17 NOTE — Progress Notes (Signed)
Pt's states no medical or surgical changes since previsit or office visit. VS assessed by C.W 

## 2022-06-17 NOTE — Patient Instructions (Signed)
Dr [name] suggested miralax for regularity.  Take a capful every day.  Read all of the handouts given to you by your recovery room nurse.  YOU HAD AN ENDOSCOPIC PROCEDURE TODAY AT [facility]:   Refer to the procedure report that was given to you for any specific questions about what was found during the examination.  If the procedure report does not answer your questions, please call your gastroenterologist to clarify.  If you requested that your care partner not be given the details of your procedure findings, then the procedure report has been included in a sealed envelope for you to review at your convenience later.  YOU SHOULD EXPECT: Some feelings of bloating in the abdomen. Passage of more gas than usual.  Walking can help get rid of the air that was put into your GI tract during the procedure and reduce the bloating. If you had a lower endoscopy (such as a colonoscopy or flexible sigmoidoscopy) you may notice spotting of blood in your stool or on the toilet paper. If you underwent a bowel prep for your procedure, you may not have a normal bowel movement for a few days.  Please Note:  You might notice some irritation and congestion in your nose or some drainage.  This is from the oxygen used during your procedure.  There is no need for concern and it should clear up in a day or so.  SYMPTOMS TO REPORT IMMEDIATELY:  Following lower endoscopy (colonoscopy or flexible sigmoidoscopy):  Excessive amounts of blood in the stool  Significant tenderness or worsening of abdominal pains  Swelling of the abdomen that is new, acute  Fever of 100F or higher   For urgent or emergent issues, a gastroenterologist can be reached at any hour by calling [phone]. Do not use MyChart messaging for urgent concerns.    DIET:  We do recommend a small meal at first, but then you may proceed to your regular diet.  Drink plenty of fluids but you should avoid alcoholic beverages for 24  hours. Try to  increase the fiber in your diet, and drink plenty of water.  ACTIVITY:  You should plan to take it easy for the rest of today and you should NOT DRIVE or use heavy machinery until tomorrow (because of the sedation medicines used during the test).    FOLLOW UP: Our staff will call the number listed on your records the next business day following your procedure.  We will call around 7:15- 8:00 am to check on you and address any questions or concerns that you may have regarding the information given to you following your procedure. If we do not reach you, we will leave a message.     If any biopsies were taken you will be contacted by phone or by letter within the next 1-3 weeks.  Please call us at [phone] if you have not heard about the biopsies in 3 weeks.    SIGNATURES/CONFIDENTIALITY: You and/or your care partner have signed paperwork which will be entered into your electronic medical record.  These signatures attest to the fact that that the information above on your After Visit Summary has been reviewed and is understood.  Full responsibility of the confidentiality of this discharge information lies with you and/or your care-partner.

## 2022-06-17 NOTE — Op Note (Signed)
[facility] Patient Name: Anthony Mann Procedure Date: 06/17/2022 8:42 AM MRN: [id number] Endoscopist: Docia Chuck. [name] , MD Age: 76 Referring MD:  Date of Birth: 02/02/1946 Gender: Male Account #: [id number] Procedure:                Colonoscopy with cold snare polypectomy x 2 Indications:              High risk colon cancer surveillance: Personal                            history of multiple (3 or more) adenomas. Previous                            examinations 2007, 2008, 2017, 2020 Medicines:                Monitored Anesthesia Care Procedure:                Pre-Anesthesia Assessment:                           - Prior to the procedure, a History and Physical                            was performed, and patient medications and                            allergies were reviewed. The patient's tolerance of                            previous anesthesia was also reviewed. The risks                            and benefits of the procedure and the sedation                            options and risks were discussed with the patient.                            All questions were answered, and informed consent                            was obtained. Prior Anticoagulants: The patient has                            taken no previous anticoagulant or antiplatelet                            agents. ASA Grade Assessment: II - A patient with                            mild systemic disease. After reviewing the risks                            and benefits, the patient was deemed in  satisfactory condition to undergo the procedure.                           After obtaining informed consent, the colonoscope                            was passed under direct vision. Throughout the                            procedure, the patient's blood pressure, pulse, and                            oxygen saturations were monitored continuously. The                             CF [id number] #[id number] was introduced through the anus                            and advanced to the the cecum, identified by                            appendiceal orifice and ileocecal valve. The                            ileocecal valve, appendiceal orifice, and rectum                            were photographed. The quality of the bowel                            preparation was excellent. The colonoscopy was                            performed without difficulty. The patient tolerated                            the procedure well. The bowel preparation used was                            SUPREP via split dose instruction. Scope In: 8:53:51 AM Scope Out: 9:08:11 AM Scope Withdrawal Time: 0 hours 11 minutes 53 seconds  Total Procedure Duration: 0 hours 14 minutes 20 seconds  Findings:                 Two polyps were found in the transverse colon. The                            polyps were 2 to 3 mm in size. These polyps were                            removed with a cold snare. Resection and retrieval                            were complete.  Multiple diverticula were found in the left colon                            and right colon.                           Internal hemorrhoids were found during                            retroflexion. The hemorrhoids were small.                           The exam was otherwise without abnormality on                            direct and retroflexion views. Complications:            No immediate complications. Estimated blood loss:                            None. Estimated Blood Loss:     Estimated blood loss: none. Impression:               - Two 2 to 3 mm polyps in the transverse colon,                            removed with a cold snare. Resected and retrieved.                           - Diverticulosis in the left colon and in the right                            colon.                           - Internal  hemorrhoids.                           - The examination was otherwise normal on direct                            and retroflexion views. Recommendation:           - Repeat colonoscopy in 5 years for surveillance.                           - Patient has a contact number available for                            emergencies. The signs and symptoms of potential                            delayed complications were discussed with the                            patient. Return to normal activities tomorrow.  Written discharge instructions were provided to the                            patient.                           - Resume previous diet.                           - Continue present medications.                           - Await pathology results. Docia Chuck. [name], MD 06/17/2022 9:13:52 AM This report has been signed electronically.

## 2022-06-17 NOTE — Progress Notes (Signed)
HISTORY OF PRESENT ILLNESS:  Anthony Mann is a 76 y.o. male with a history of multiple adenomatous colon polyps.  Multiple prior colonoscopies.  Last examination 2020.  Now for surveillance  REVIEW OF SYSTEMS:  All non-GI ROS negative. Past Medical History:  Diagnosis Date   Arthritis    Cataract    right eye in [place], left here 10/2013   History of hepatitis B    Hyperlipidemia    Hypertension    Prostate cancer ([place]) 2018    Past Surgical History:  Procedure Laterality Date   arm trauma     he had an industrial accident in [place]  03/24/2016   EYE SURGERY     right eye cataract  in [place] MICRODISCECTOMY Bilateral 04/29/2021   Procedure: LAMINECTOMY/LAMINOTOMY AND FORAMINOTOMY BILATERAL LUMBAR ONE-TWO, LUMBAR THREE-FOUR, LEFT LUMBAR FOUR-FIVE;  Surgeon: Newman Pies, MD;  Location: [facility];  Service: Neurosurgery;  Laterality: Bilateral;   PROSTATE BIOPSY  last July 2020   negative results per pt   SKIN GRAFT FULL THICKNESS ARM     right arm   ULNAR NERVE TRANSPOSITION     in [place]  reports that he has never smoked. He has never used smokeless tobacco. He reports current alcohol use of about 3.0 standard drinks of alcohol per week. He reports that he does not use drugs.  family history includes Cancer in his father; Colon cancer (age of onset: 72) in his father; Diabetes (age of onset: 56) in his mother; Heart disease (age of onset: 71) in his father; Kidney disease in his father.  No Known Allergies     PHYSICAL EXAMINATION: Vital signs: BP 113/60   Pulse 95   Temp (!) 97.1 F (36.2 C) (Skin)   Ht '5\' 5"'$  (1.651 m)   Wt 166 lb (75.3 kg)   SpO2 97%   BMI 27.62 kg/m  General: Well-developed, well-nourished, no acute distress HEENT: Sclerae are anicteric, conjunctiva pink. Oral mucosa intact Lungs: Clear Heart: Regular Abdomen: soft, nontender, nondistended, no obvious ascites, no peritoneal  signs, normal bowel sounds. No organomegaly. Extremities: No edema Psychiatric: alert and oriented x3. Cooperative      ASSESSMENT:  History of multiple adenomatous colon polyps   PLAN:  Surveillance colonoscopy

## 2022-06-18 ENCOUNTER — Telehealth: Payer: Self-pay | Admitting: *Deleted

## 2022-06-18 NOTE — Telephone Encounter (Signed)
  Follow up Call-     06/17/2022    7:32 AM  Call back number  Post procedure Call Back phone  # [phone]  Permission to leave phone message Yes     Patient questions:  Do you have a fever, pain , or abdominal swelling? No. Pain Score  0 *  Have you tolerated food without any problems? Yes.    Have you been able to return to your normal activities? Yes.    Do you have any questions about your discharge instructions: Diet   No. Medications  No. Follow up visit  No.  Do you have questions or concerns about your Care? No.  Actions: * If pain score is 4 or above: No action needed, pain <4.

## 2022-06-22 ENCOUNTER — Encounter: Payer: Self-pay | Admitting: Internal Medicine

## 2022-09-01 ENCOUNTER — Telehealth: Payer: Self-pay | Admitting: Family Medicine

## 2022-09-01 NOTE — Telephone Encounter (Signed)
Copied from [place] [id number]. Topic: Medicare AWV >> Sep 01, 2022 11:22 AM Gillis Santa wrote: Reason for CRM: LVM PATIENT TO CALL [phone] TO SCHEDULE AWV [place]

## 2022-09-08 DIAGNOSIS — Z8546 Personal history of malignant neoplasm of prostate: Secondary | ICD-10-CM | POA: Diagnosis not present

## 2022-09-20 ENCOUNTER — Telehealth: Payer: Self-pay | Admitting: Family Medicine

## 2022-09-20 NOTE — Telephone Encounter (Signed)
Copied from [place] [id number]. Topic: Medicare AWV >> Sep 20, 2022 10:43 AM Gillis Santa wrote: Reason for CRM: LVM CALL BACK [phone] NEEDS TO SCHEDULE AWVS [place]

## 2022-10-22 DIAGNOSIS — M48062 Spinal stenosis, lumbar region with neurogenic claudication: Secondary | ICD-10-CM | POA: Diagnosis not present

## 2022-10-22 DIAGNOSIS — M5412 Radiculopathy, cervical region: Secondary | ICD-10-CM | POA: Diagnosis not present

## 2022-10-22 DIAGNOSIS — M4712 Other spondylosis with myelopathy, cervical region: Secondary | ICD-10-CM | POA: Diagnosis not present

## 2022-11-08 ENCOUNTER — Other Ambulatory Visit: Payer: Self-pay | Admitting: Family Medicine

## 2022-11-11 ENCOUNTER — Telehealth: Payer: Self-pay | Admitting: Family Medicine

## 2022-11-11 NOTE — Telephone Encounter (Signed)
Copied from [place] [id number]. Topic: Medicare AWV >> Nov 11, 2022  9:46 AM Gillis Santa wrote: Reason for CRM: Called patient to schedule Medicare Annual Wellness Visit (AWV). Left message for patient to call back and schedule Medicare Annual Wellness Visit (AWV).  Last date of AWV: 11/05/2019  Please schedule an appointment at any time with Otila Kluver, [facility].  If any questions, please contact me at [phone].  Thank you ,  Shaune Pollack [facility] AWV TEAM Direct Dial [phone]

## 2022-11-24 ENCOUNTER — Encounter: Payer: Self-pay | Admitting: Family Medicine

## 2022-11-24 ENCOUNTER — Ambulatory Visit (INDEPENDENT_AMBULATORY_CARE_PROVIDER_SITE_OTHER): Payer: Medicare HMO | Admitting: Family Medicine

## 2022-11-24 VITALS — BP 128/74 | HR 73 | Temp 98.0°F | Ht 65.0 in | Wt 160.4 lb

## 2022-11-24 DIAGNOSIS — E785 Hyperlipidemia, unspecified: Secondary | ICD-10-CM | POA: Diagnosis not present

## 2022-11-24 DIAGNOSIS — E663 Overweight: Secondary | ICD-10-CM

## 2022-11-24 DIAGNOSIS — B181 Chronic viral hepatitis B without delta-agent: Secondary | ICD-10-CM

## 2022-11-24 DIAGNOSIS — I10 Essential (primary) hypertension: Secondary | ICD-10-CM | POA: Diagnosis not present

## 2022-11-24 DIAGNOSIS — C61 Malignant neoplasm of prostate: Secondary | ICD-10-CM

## 2022-11-24 DIAGNOSIS — Z131 Encounter for screening for diabetes mellitus: Secondary | ICD-10-CM | POA: Diagnosis not present

## 2022-11-24 DIAGNOSIS — Z0000 Encounter for general adult medical examination without abnormal findings: Secondary | ICD-10-CM

## 2022-11-24 DIAGNOSIS — R69 Illness, unspecified: Secondary | ICD-10-CM | POA: Diagnosis not present

## 2022-11-24 LAB — COMPREHENSIVE METABOLIC PANEL
ALT: 17 U/L (ref 0–53)
AST: 18 U/L (ref 0–37)
Albumin: 4.6 g/dL (ref 3.5–5.2)
Alkaline Phosphatase: 39 U/L (ref 39–117)
BUN: 18 mg/dL (ref 6–23)
CO2: 27 mEq/L (ref 19–32)
Calcium: 10.2 mg/dL (ref 8.4–10.5)
Chloride: 101 mEq/L (ref 96–112)
Creatinine, Ser: 1.11 mg/dL (ref 0.40–1.50)
GFR: 64.96 mL/min (ref >60.00)
Glucose, Bld: 92 mg/dL (ref 70–99)
Potassium: 4.1 mEq/L (ref 3.5–5.1)
Sodium: 137 mEq/L (ref 135–145)
Total Bilirubin: 1.2 mg/dL (ref 0.2–1.2)
Total Protein: 6.9 g/dL (ref 6.0–8.3)

## 2022-11-24 LAB — CBC WITH DIFFERENTIAL/PLATELET
Basophils Absolute: 0 10*3/uL (ref 0.0–0.1)
Basophils Relative: 0.6 % (ref 0.0–3.0)
Eosinophils Absolute: 0.1 10*3/uL (ref 0.0–0.7)
Eosinophils Relative: 1.6 % (ref 0.0–5.0)
HCT: 44.7 % (ref 39.0–52.0)
Hemoglobin: 15.4 g/dL (ref 13.0–17.0)
Lymphocytes Relative: 33.5 % (ref 12.0–46.0)
Lymphs Abs: 2 10*3/uL (ref 0.7–4.0)
MCHC: 34.5 g/dL (ref 30.0–36.0)
MCV: 93.7 fl (ref 78.0–100.0)
Monocytes Absolute: 0.8 10*3/uL (ref 0.1–1.0)
Monocytes Relative: 12.5 % — ABNORMAL HIGH (ref 3.0–12.0)
Neutro Abs: 3.1 10*3/uL (ref 1.4–7.7)
Neutrophils Relative %: 51.8 % (ref 43.0–77.0)
Platelets: 219 10*3/uL (ref 150.0–400.0)
RBC: 4.77 Mil/uL (ref 4.22–5.81)
RDW: 13 % (ref 11.5–15.5)
WBC: 6 10*3/uL (ref 4.0–10.5)

## 2022-11-24 LAB — LIPID PANEL
Cholesterol: 120 mg/dL (ref 0–200)
HDL: 52.9 mg/dL (ref >39.00)
LDL Cholesterol: 45 mg/dL (ref 0–99)
NonHDL: 67.44
Total CHOL/HDL Ratio: 2
Triglycerides: 114 mg/dL (ref 0.0–149.0)
VLDL: 22.8 mg/dL (ref 0.0–40.0)

## 2022-11-24 LAB — HEMOGLOBIN A1C: Hgb A1c MFr Bld: 5.5 % (ref 4.6–6.5)

## 2022-11-24 NOTE — Patient Instructions (Addendum)
You are eligible to schedule your annual wellness visit with our nurse specialist Otila Kluver.  Please consider scheduling this before you leave today  Please stop by lab before you go If you have mychart- we will send your results within 3 business days of [place] receiving them.  If you do not have mychart- we will call you about results within 5 business days of [place] receiving them.  *please also note that you will see labs on mychart as soon as they post. I will later go in and write notes on them- will say "notes from Dr. Yong Channel"   No changes today unless labs lead [place] to make changes   Recommended follow up: Return in about 6 months (around 05/27/2023) for followup or sooner if needed.Schedule b4 you leave.

## 2022-11-24 NOTE — Progress Notes (Signed)
Phone: [phone]   Subjective:  Patient presents today for their annual physical. Chief complaint-noted.   See problem oriented charting- ROS- full  review of systems was completed and negative  except for: ongoing back pain issues particularly when active- working in yard a lot  The following were reviewed and entered/updated in epic: Past Medical History:  Diagnosis Date   Arthritis    Cataract    right eye in [place], left here 10/2013   History of hepatitis B    Hyperlipidemia    Hypertension    Prostate cancer ([place]) 2018   Patient Active Problem List   Diagnosis Date Noted   Prostate cancer ([place]) 08/03/2017    Priority: High   Hepatitis B     Priority: High   Laryngopharyngeal reflux (LPR) 10/30/2019    Priority: Medium    Depressed mood 06/27/2017    Priority: Medium    BPH associated with nocturia 01/26/2016    Priority: Medium    Hyperlipidemia 07/16/2014    Priority: Medium    HTN (hypertension) 10/14/2010    Priority: Medium    History of adenomatous polyp of colon 05/11/2019    Priority: Low   Lumbar pseudoarthrosis 04/29/2021    Priority: 1.   Low back pain 12/04/2019    Priority: 1.   Osteoarthritis of right knee 11/29/2017    Priority: 1.   Atypical chest pain 12/10/2019   Past Surgical History:  Procedure Laterality Date   arm trauma     he had an industrial accident in [place]  03/24/2016   EYE SURGERY     right eye cataract  in [place] MICRODISCECTOMY Bilateral 04/29/2021   Procedure: LAMINECTOMY/LAMINOTOMY AND FORAMINOTOMY BILATERAL LUMBAR ONE-TWO, LUMBAR THREE-FOUR, LEFT LUMBAR FOUR-FIVE;  Surgeon: Newman Pies, MD;  Location: [facility];  Service: Neurosurgery;  Laterality: Bilateral;   PROSTATE BIOPSY  last July 2020   negative results per pt   SKIN GRAFT FULL THICKNESS ARM     right arm   ULNAR NERVE TRANSPOSITION     in 1991    Family History  Problem Relation Age of Onset   Cancer Father         colon   Heart disease Father 31   Kidney disease Father        nephectomy   Colon cancer Father 51   Diabetes Mother 84   Colon polyps Neg Hx    Esophageal cancer Neg Hx    Rectal cancer Neg Hx    Stomach cancer Neg Hx     Medications- reviewed and updated Current Outpatient Medications  Medication Sig Dispense Refill   amLODipine (NORVASC) 5 MG tablet TAKE 1 TABLET BY MOUTH EVERY DAY 90 tablet 3   aspirin EC 81 MG tablet Take 81 mg by mouth daily.     finasteride (PROSCAR) 5 MG tablet Take 5 mg by mouth daily.     hydrochlorothiazide (HYDRODIURIL) 25 MG tablet TAKE 1 TABLET BY MOUTH EVERY DAY 90 tablet 3   irbesartan (AVAPRO) 300 MG tablet TAKE 1 TABLET BY MOUTH EVERY DAY 90 tablet 3   rosuvastatin (CRESTOR) 10 MG tablet TAKE 1 TABLET BY MOUTH EVERY DAY 90 tablet 3   No current facility-administered medications for this visit.    Allergies-reviewed and updated No Known Allergies  Social History   Social History Narrative   Moved to [place] in 2011 from [place] due to more affordable.  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       Married 1972 [place]). 2 kids. 5 grandkids. 2 in [place], 3 [place]. Son coaches at [employer] for soccer team and is a Writer from there.       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 Oropharynx normal. Tympanic membrane normal on the right, only able to see about 10% on the left per baseline with bulge in canal Neck: no thyromegaly CV: RRR no murmurs rubs or gallops Lungs: CTAB no crackles, wheeze, rhonchi Abdomen: soft/nontender/nondistended/normal bowel sounds. No rebound or  guarding.  Ext: no edema Skin: warm, dry Neuro: grossly normal, moves all extremities, PERRLA   Assessment and Plan  77 y.o. male presenting for annual physical.  Health Maintenance counseling: 1. Anticipatory guidance: Patient counseled regarding regular dental exams -dentures, eye exams - no issues, advised consider yearly,  avoiding smoking and second hand smoke , limiting alcohol to 2 beverages per day - very rare beer, no illicit drugs.   2. Risk factor reduction:  Advised patient of need for regular exercise and diet rich and fruits and vegetables to reduce risk of heart attack and stroke.  Exercise- active in his yard, doing some walking around neighborhood as well- - goal 150 minutes a week.  Diet/weight management-weight down 7 lbs- tightened up diet from last visit- more fruits/veggies.  Wt Readings from Last 3 Encounters:  11/24/22 160 lb 6.4 oz (72.8 kg)  06/17/22 166 lb (75.3 kg)  05/25/22 167 lb 3.2 oz (75.8 kg)  3. Immunizations/screenings/ancillary studies- holding off on covid shots Immunization History  Administered Date(s) Administered   Fluad Quad(high Dose 65+) 10/14/2020, 05/20/2021, 06/14/2022   Influenza Split 07/13/2011, 07/14/2013   Influenza, High Dose Seasonal PF 07/27/2016, 06/27/2017, 06/20/2019, 07/29/2020   Influenza,inj,Quad PF,6+ Mos 07/01/2014, 08/18/2015, 07/13/2018   Influenza,inj,quad, With Preservative 07/27/2017   Influenza-Unspecified 06/14/2011, 06/12/2012, 06/13/2014   PFIZER(Purple Top)SARS-COV-2 Vaccination 10/05/2019, 10/26/2019, 09/22/2020   PNEUMOCOCCAL CONJUGATE-20 05/25/2022   Pneumococcal Conjugate-13 03/11/2014   Pneumococcal Polysaccharide-23 09/13/2008   Pneumococcal-Unspecified 03/13/2014   Td 10/14/2008   Tdap 09/16/2011   Zoster Recombinat (Shingrix) 08/05/2021, 10/07/2021   Zoster, Live 01/12/2013, 09/14/2014  4. Prostate cancer screening- see below - last visit June 2023 on record- but states goes every 6 months- declines  psa here  5. Colon cancer screening - History of adenomatous polyp-October 2023 with Dr. Rene Paci recommend you have a repeat colonoscopy in 5 years 6. Skin cancer screening- does not see dermatology. advised regular sunscreen use. Denies worrisome, changing, or new skin lesions.  7. Smoking associated screening (lung cancer screening, AAA screen 65-75, UA)- never smoker 8. STD screening - only active with wife  Status of chronic or acute concerns   #Chronic Hepatitis B S:Hepatitis B DNA has trended up over 2000 in the past but ALT remains less than 2 times upper limit of normal and later hepatitis B trended back down -Very rare alcohol-reports not even weekly  -Annual ultrasound to rule out hepatocellular carcinoma-last in 2022  A/P: suspect stable- update HBV  DNA and RUQ [place] to evaluate for [facility]   #hypertension S: medication: Amlodipine 5 mg, hydrochlorothiazide 25 mg, irbesartan 300 mg -plus staying active,watching diet- minimizes salt BP Readings from Last 3 Encounters:  11/24/22 128/74  06/17/22 (!) 113/59  05/25/22 110/64  A/P: stable- continue current medicines    #hyperlipidemia S: Medication:Rosuvastatin 10 mg listed as daily but taking every third day and has still been controlled, aspirin 81 mg Lab Results  Component Value Date   CHOL 118 11/18/2021   HDL 53.10 11/18/2021   LDLCALC 45 11/18/2021   LDLDIRECT 60.0 05/20/2021   TRIG 98.0 11/18/2021   CHOLHDL 2 11/18/2021  A/P: hopefully stable- update lipid panel today. Continue current meds for now    #Prostate cancer-remains on active surveillance with urology-PSA levels have trended down on finasteride #BPH with nocturia S: Medication: Finasteride 5 mg  A/P:  last MRI June 2023 with plan for annual recheck and reports q6 months PSA/office visits    #Narrowed left ear canal-see prior notes- declines further follow up - no worsening noted. Reports has seen ENT in past  #February 2021 with cervical radiculopathy and  surgery- still has weakness in the left arm/shoulder  but improved- had recent visit for annual check up for this and laminectomy. Remains very active from both neck and low back issues    # Laminectomy/Laminotomy surgery - 04/29/2021-04/30/2021 of L1-L2, L3-L4, L4-L5 - off pain meds other than sparing hydrocodone- almost never needs  Recommended follow up: Return in about 6 months (around 05/27/2023) for followup or sooner if needed.Schedule b4 you leave.   Lab/Order associations: fasting outside of french vanilla creamer splash In coffee   ICD-10-CM   1. Preventative health care  Z00.00     2. Chronic viral hepatitis B without delta agent and without coma (HCC)  B18.1 Hepatitis B DNA, ultraquantitative, PCR    US Abdomen Limited RUQ (LIVER/GB)    3. Prostate cancer ([place])  C61     4. Primary hypertension  I10     5. Hyperlipidemia, unspecified hyperlipidemia type  E78.5 CBC with Differential/Platelet    Comprehensive metabolic panel    Lipid panel    6. Screening for diabetes mellitus  Z13.1 HgB A1c    7. Overweight  E66.3 HgB A1c     No orders of the defined types were placed in this encounter.  Return precautions advised.  Garret Reddish, MD

## 2022-11-26 LAB — HEPATITIS B DNA, ULTRAQUANTITATIVE, PCR
Hepatitis B DNA: 885 IU/mL — ABNORMAL HIGH
Hepatitis B virus DNA: 2.95 Log IU/mL — ABNORMAL HIGH

## 2022-12-01 ENCOUNTER — Telehealth: Payer: Self-pay | Admitting: Family Medicine

## 2022-12-01 NOTE — Telephone Encounter (Signed)
Copied from [place] [id number]. Topic: Medicare AWV >> Dec 01, 2022  9:46 AM Gillis Santa wrote: Reason for CRM: Called patient to schedule Medicare Annual Wellness Visit (AWV). Left message for patient to call back and schedule Medicare Annual Wellness Visit (AWV).  Last date of AWV: 11/05/2019  Please schedule an appointment at any time with Otila Kluver, [facility].  Please schedule AWVS with [facility] Otila Kluver, [place] [facility]. if any questions, please contact me at [phone].  Thank you , Shaune Pollack [facility] AWV TEAM Direct Dial [phone]

## 2022-12-15 ENCOUNTER — Telehealth: Payer: Self-pay | Admitting: Family Medicine

## 2022-12-15 NOTE — Telephone Encounter (Signed)
Copied from [place] [id number]. Topic: Medicare AWV >> Dec 15, 2022 11:30 AM Gillis Santa wrote: Jodi Mourning for CRM: Called patient to schedule Medicare Annual Wellness Visit (AWV). Left message for patient to call back and schedule Medicare Annual Wellness Visit (AWV).  Last date of AWV: 11/04/2020  Please schedule an appointment at any time with Otila Kluver, [facility]. Please schedule AWVS with Otila Kluver, [place]..  If any questions, please contact me at [phone].  Thank you ,  Shaune Pollack [facility] AWV TEAM Direct Dial [phone]

## 2022-12-22 ENCOUNTER — Ambulatory Visit
Admission: RE | Admit: 2022-12-22 | Discharge: 2022-12-22 | Disposition: A | Discharge status: Home / Self Care | Payer: Medicare HMO | Source: Ambulatory Visit | Attending: Family Medicine | Admitting: Family Medicine

## 2022-12-22 DIAGNOSIS — B181 Chronic viral hepatitis B without delta-agent: Secondary | ICD-10-CM

## 2022-12-22 DIAGNOSIS — K8020 Calculus of gallbladder without cholecystitis without obstruction: Secondary | ICD-10-CM | POA: Diagnosis not present

## 2023-01-12 ENCOUNTER — Other Ambulatory Visit: Payer: Self-pay | Admitting: Urology

## 2023-01-12 DIAGNOSIS — Z8546 Personal history of malignant neoplasm of prostate: Secondary | ICD-10-CM

## 2023-01-24 ENCOUNTER — Telehealth: Payer: Self-pay | Admitting: Family Medicine

## 2023-01-24 NOTE — Telephone Encounter (Signed)
Copied from CRM [id number]. Topic: Medicare AWV >> Jan 24, 2023  9:13 AM Gwenith Spitz wrote: Reason for CRM: Called patient to schedule Medicare Annual Wellness Visit (AWV). Left message for patient to call back and schedule Medicare Annual Wellness Visit (AWV).  Last date of AWV: 11/05/2019  Please schedule an appointment at any time with Inetta Fermo, [facility]. Please schedule AWVS with Inetta Fermo, [facility] [place].  If any questions, please contact me at [phone].  Thank you ,  Gabriel Cirri [facility] AWV TEAM Direct Dial [phone]

## 2023-02-03 DIAGNOSIS — H61812 Exostosis of left external canal: Secondary | ICD-10-CM | POA: Diagnosis not present

## 2023-03-07 DIAGNOSIS — Z8546 Personal history of malignant neoplasm of prostate: Secondary | ICD-10-CM | POA: Diagnosis not present

## 2023-03-14 ENCOUNTER — Ambulatory Visit
Admission: RE | Admit: 2023-03-14 | Discharge: 2023-03-14 | Disposition: A | Discharge status: Home / Self Care | Payer: Medicare HMO | Source: Ambulatory Visit | Attending: Urology | Admitting: Urology

## 2023-03-14 DIAGNOSIS — Z8546 Personal history of malignant neoplasm of prostate: Secondary | ICD-10-CM

## 2023-03-14 DIAGNOSIS — N429 Disorder of prostate, unspecified: Secondary | ICD-10-CM | POA: Diagnosis not present

## 2023-03-14 DIAGNOSIS — N400 Enlarged prostate without lower urinary tract symptoms: Secondary | ICD-10-CM | POA: Diagnosis not present

## 2023-03-14 MED ORDER — GADOPICLENOL 0.5 MMOL/ML IV SOLN
7.0000 mL | Freq: Once | INTRAVENOUS | Status: AC | PRN
  Administered 2023-03-14: 7 mL via INTRAVENOUS

## 2023-03-21 DIAGNOSIS — Z8546 Personal history of malignant neoplasm of prostate: Secondary | ICD-10-CM | POA: Diagnosis not present

## 2023-03-21 DIAGNOSIS — R972 Elevated prostate specific antigen [PSA]: Secondary | ICD-10-CM | POA: Diagnosis not present

## 2023-04-22 ENCOUNTER — Other Ambulatory Visit: Payer: Self-pay | Admitting: Family Medicine

## 2023-04-23 ENCOUNTER — Other Ambulatory Visit: Payer: Self-pay | Admitting: Family Medicine

## 2023-04-26 DIAGNOSIS — M48062 Spinal stenosis, lumbar region with neurogenic claudication: Secondary | ICD-10-CM | POA: Diagnosis not present

## 2023-04-26 DIAGNOSIS — M4712 Other spondylosis with myelopathy, cervical region: Secondary | ICD-10-CM | POA: Diagnosis not present

## 2023-04-28 ENCOUNTER — Encounter (INDEPENDENT_AMBULATORY_CARE_PROVIDER_SITE_OTHER): Payer: Self-pay

## 2023-05-25 ENCOUNTER — Ambulatory Visit (INDEPENDENT_AMBULATORY_CARE_PROVIDER_SITE_OTHER): Payer: Medicare HMO | Admitting: Family Medicine

## 2023-05-25 ENCOUNTER — Encounter: Payer: Self-pay | Admitting: Family Medicine

## 2023-05-25 VITALS — BP 130/70 | HR 68 | Temp 97.9°F | Ht 65.0 in | Wt 162.0 lb

## 2023-05-25 DIAGNOSIS — I10 Essential (primary) hypertension: Secondary | ICD-10-CM

## 2023-05-25 DIAGNOSIS — B181 Chronic viral hepatitis B without delta-agent: Secondary | ICD-10-CM | POA: Diagnosis not present

## 2023-05-25 DIAGNOSIS — E785 Hyperlipidemia, unspecified: Secondary | ICD-10-CM

## 2023-05-25 DIAGNOSIS — Z23 Encounter for immunization: Secondary | ICD-10-CM

## 2023-05-25 LAB — COMPREHENSIVE METABOLIC PANEL
ALT: 18 U/L (ref 0–53)
AST: 21 U/L (ref 0–37)
Albumin: 4.3 g/dL (ref 3.5–5.2)
Alkaline Phosphatase: 34 U/L — ABNORMAL LOW (ref 39–117)
BUN: 21 mg/dL (ref 6–23)
CO2: 26 meq/L (ref 19–32)
Calcium: 10 mg/dL (ref 8.4–10.5)
Chloride: 101 meq/L (ref 96–112)
Creatinine, Ser: 1.01 mg/dL (ref 0.40–1.50)
GFR: 72.5 mL/min (ref >60.00)
Glucose, Bld: 89 mg/dL (ref 70–99)
Potassium: 3.8 meq/L (ref 3.5–5.1)
Sodium: 135 meq/L (ref 135–145)
Total Bilirubin: 1.3 mg/dL — ABNORMAL HIGH (ref 0.2–1.2)
Total Protein: 7.2 g/dL (ref 6.0–8.3)

## 2023-05-25 NOTE — Patient Instructions (Addendum)
Health Maintenance Due  Topic Date Due   Medicare Annual Wellness (AWV)  11/04/2020  You are eligible to schedule your annual wellness visit with our nurse specialist Inetta Fermo.  Please consider scheduling this before you leave today  Please stop by lab before you go If you have mychart- we will send your results within 3 business days of [place] receiving them.  If you do not have mychart- we will call you about results within 5 business days of [place] receiving them.  *please also note that you will see labs on mychart as soon as they post. I will later go in and write notes on them- will say "notes from Dr. Durene Cal"   Recommended follow up: Return in about 6 months (around 11/22/2023) for physical or sooner if needed.Schedule b4 you leave.

## 2023-05-25 NOTE — Progress Notes (Signed)
Phone [phone] In person visit   Subjective:   Anthony Mann is a 77 y.o. year old very pleasant male patient who presents for/with See problem oriented charting Chief Complaint  Patient presents with   Medical Management of Chronic Issues   Hyperlipidemia   Hypertension   Past Medical History-  Patient Active Problem List   Diagnosis Date Noted   Prostate cancer (HCC) 08/03/2017    Priority: High   Hepatitis B     Priority: High   Laryngopharyngeal reflux (LPR) 10/30/2019    Priority: Medium    Depressed mood 06/27/2017    Priority: Medium    BPH associated with nocturia 01/26/2016    Priority: Medium    Hyperlipidemia 07/16/2014    Priority: Medium    HTN (hypertension) 10/14/2010    Priority: Medium    History of adenomatous polyp of colon 05/11/2019    Priority: Low   Lumbar pseudoarthrosis 04/29/2021    Priority: 1.   Low back pain 12/04/2019    Priority: 1.   Osteoarthritis of right knee 11/29/2017    Priority: 1.   Atypical chest pain 12/10/2019    Medications- reviewed and updated Current Outpatient Medications  Medication Sig Dispense Refill   amLODipine (NORVASC) 5 MG tablet TAKE 1 TABLET BY MOUTH EVERY DAY 90 tablet 3   aspirin EC 81 MG tablet Take 81 mg by mouth daily.     finasteride (PROSCAR) 5 MG tablet Take 5 mg by mouth daily.     hydrochlorothiazide (HYDRODIURIL) 25 MG tablet TAKE 1 TABLET BY MOUTH EVERY DAY 90 tablet 3   irbesartan (AVAPRO) 300 MG tablet TAKE 1 TABLET BY MOUTH EVERY DAY 90 tablet 3   rosuvastatin (CRESTOR) 10 MG tablet TAKE 1 TABLET BY MOUTH EVERY DAY 90 tablet 3   No current facility-administered medications for this visit.     Objective:  BP 130/70   Pulse 68   Temp 97.9 F (36.6 C)   Ht 5\' 5"  (1.651 m)   Wt 162 lb (73.5 kg)   SpO2 99%   BMI 26.96 kg/m  Gen: NAD, resting comfortably CV: RRR no murmurs rubs or gallops Lungs: CTAB no crackles, wheeze, rhonchi Ext: no edema Skin: warm, dry     Assessment  and Plan   #Chronic Hepatitis B S:Hepatitis B DNA has trended up over 2000 in the past but ALT remains less than 2 times upper limit of normal and later hepatitis B trended back down -Very rare alcohol-reports less than once a week  -Annual ultrasound to rule out hepatocellular carcinoma-last 12/22/22 A/P: suspect stable- update hepatitis B today- as well as CMP - no meds   #hypertension S: medication: Amlodipine 5 mg, hydrochlorothiazide 25 mg, irbesartan 300 mg Home readings #s: similar readings or lower at home -walking 3-4 days a week BP Readings from Last 3 Encounters:  05/25/23 130/70  11/24/22 128/74  06/17/22 (!) 113/59  A/P: stable- continue current medicines    #hyperlipidemia S: Medication:Rosuvastatin 10 mg listed as daily but taking every third day and has still been controlled, aspirin 81 mg  Lab Results  Component Value Date   CHOL 120 11/24/2022   HDL 52.90 11/24/2022   LDLCALC 45 11/24/2022   LDLDIRECT 60.0 05/20/2021   TRIG 114.0 11/24/2022   CHOLHDL 2 11/24/2022  A/P: very well controlled continue current medications    #Prostate cancer-remains on active surveillance with urology-PSA levels have trended down on finasteride #BPH with nocturia S: Medication: Finasteride 5 mg  -sees  urology ever 6 months with PSA . Annual MRI it appears A/P: remains under active surveillance- continue close follow up with Dr. Liliane Shi   #Low back arthritis reported-  feels stiff in morning but better as day goes on - neck not bothering him lately -still sees [place] neurosurgery every 6 months - has some hydrocodone if needed- just seen 04/26/23  Recommended follow up: Return in about 6 months (around 11/22/2023) for physical or sooner if needed.Schedule b4 you leave.   Lab/Order associations:   ICD-10-CM   1. Primary hypertension  I10     2. Hyperlipidemia, unspecified hyperlipidemia type  E78.5     3. Screening for diabetes mellitus  Z13.1     4. Overweight  E66.3      5. Chronic viral hepatitis B without delta agent and without coma (HCC)  B18.1     6. Need for influenza vaccination  Z23 Flu Vaccine Trivalent High Dose (Fluad)      No orders of the defined types were placed in this encounter.   Return precautions advised.  Tana Conch, MD

## 2023-05-27 LAB — HEPATITIS B DNA, ULTRAQUANTITATIVE, PCR
Hepatitis B DNA: 451 [IU]/mL — ABNORMAL HIGH
Hepatitis B virus DNA: 2.65 {Log_IU}/mL — ABNORMAL HIGH

## 2023-10-17 ENCOUNTER — Other Ambulatory Visit: Payer: Self-pay | Admitting: Family Medicine

## 2023-10-25 DIAGNOSIS — M48062 Spinal stenosis, lumbar region with neurogenic claudication: Secondary | ICD-10-CM | POA: Diagnosis not present

## 2023-12-08 ENCOUNTER — Ambulatory Visit (INDEPENDENT_AMBULATORY_CARE_PROVIDER_SITE_OTHER): Payer: Medicare HMO | Admitting: Family Medicine

## 2023-12-08 ENCOUNTER — Encounter: Payer: Self-pay | Admitting: Family Medicine

## 2023-12-08 VITALS — BP 130/64 | HR 76 | Temp 97.4°F | Ht 65.0 in | Wt 164.4 lb

## 2023-12-08 DIAGNOSIS — I10 Essential (primary) hypertension: Secondary | ICD-10-CM | POA: Diagnosis not present

## 2023-12-08 DIAGNOSIS — E663 Overweight: Secondary | ICD-10-CM | POA: Diagnosis not present

## 2023-12-08 DIAGNOSIS — Z131 Encounter for screening for diabetes mellitus: Secondary | ICD-10-CM | POA: Diagnosis not present

## 2023-12-08 DIAGNOSIS — C61 Malignant neoplasm of prostate: Secondary | ICD-10-CM

## 2023-12-08 DIAGNOSIS — B181 Chronic viral hepatitis B without delta-agent: Secondary | ICD-10-CM

## 2023-12-08 DIAGNOSIS — Z0000 Encounter for general adult medical examination without abnormal findings: Secondary | ICD-10-CM | POA: Diagnosis not present

## 2023-12-08 DIAGNOSIS — E785 Hyperlipidemia, unspecified: Secondary | ICD-10-CM | POA: Diagnosis not present

## 2023-12-08 LAB — CBC WITH DIFFERENTIAL/PLATELET
Basophils Absolute: 0.1 10*3/uL (ref 0.0–0.1)
Basophils Relative: 0.7 % (ref 0.0–3.0)
Eosinophils Absolute: 0.1 10*3/uL (ref 0.0–0.7)
Eosinophils Relative: 1 % (ref 0.0–5.0)
HCT: 45.7 % (ref 39.0–52.0)
Hemoglobin: 15.6 g/dL (ref 13.0–17.0)
Lymphocytes Relative: 27.1 % (ref 12.0–46.0)
Lymphs Abs: 2 10*3/uL (ref 0.7–4.0)
MCHC: 34.1 g/dL (ref 30.0–36.0)
MCV: 95.1 fl (ref 78.0–100.0)
Monocytes Absolute: 0.7 10*3/uL (ref 0.1–1.0)
Monocytes Relative: 9.2 % (ref 3.0–12.0)
Neutro Abs: 4.6 10*3/uL (ref 1.4–7.7)
Neutrophils Relative %: 62 % (ref 43.0–77.0)
Platelets: 218 10*3/uL (ref 150.0–400.0)
RBC: 4.8 Mil/uL (ref 4.22–5.81)
RDW: 13.2 % (ref 11.5–15.5)
WBC: 7.5 10*3/uL (ref 4.0–10.5)

## 2023-12-08 LAB — LIPID PANEL
Cholesterol: 106 mg/dL (ref 0–200)
HDL: 49.1 mg/dL (ref >39.00)
LDL Cholesterol: 39 mg/dL (ref 0–99)
NonHDL: 57.1
Total CHOL/HDL Ratio: 2
Triglycerides: 92 mg/dL (ref 0.0–149.0)
VLDL: 18.4 mg/dL (ref 0.0–40.0)

## 2023-12-08 LAB — COMPREHENSIVE METABOLIC PANEL WITH GFR
ALT: 16 U/L (ref 0–53)
AST: 18 U/L (ref 0–37)
Albumin: 4.7 g/dL (ref 3.5–5.2)
Alkaline Phosphatase: 36 U/L — ABNORMAL LOW (ref 39–117)
BUN: 21 mg/dL (ref 6–23)
CO2: 27 meq/L (ref 19–32)
Calcium: 10.4 mg/dL (ref 8.4–10.5)
Chloride: 102 meq/L (ref 96–112)
Creatinine, Ser: 1.22 mg/dL (ref 0.40–1.50)
GFR: 57.58 mL/min — ABNORMAL LOW (ref >60.00)
Glucose, Bld: 96 mg/dL (ref 70–99)
Potassium: 4.7 meq/L (ref 3.5–5.1)
Sodium: 137 meq/L (ref 135–145)
Total Bilirubin: 1.1 mg/dL (ref 0.2–1.2)
Total Protein: 7.4 g/dL (ref 6.0–8.3)

## 2023-12-08 NOTE — Progress Notes (Signed)
 Phone: [phone]   Subjective:  Patient presents today for their annual physical. Chief complaint-noted.   See problem oriented charting- ROS- full  review of systems was completed and negative  Per full ROS sheet completed by patient  The following were reviewed and entered/updated in epic: Past Medical History:  Diagnosis Date   Arthritis    Cataract    right eye in [place], left here 10/2013   History of hepatitis B    Hyperlipidemia    Hypertension    Prostate cancer (HCC) 2018   Patient Active Problem List   Diagnosis Date Noted   Prostate cancer (HCC) 08/03/2017    Priority: High   Hepatitis B     Priority: High   Laryngopharyngeal reflux (LPR) 10/30/2019    Priority: Medium    Depressed mood 06/27/2017    Priority: Medium    BPH associated with nocturia 01/26/2016    Priority: Medium    Hyperlipidemia 07/16/2014    Priority: Medium    HTN (hypertension) 10/14/2010    Priority: Medium    History of adenomatous polyp of colon 05/11/2019    Priority: Low   Lumbar pseudoarthrosis 04/29/2021    Priority: 1.   Low back pain 12/04/2019    Priority: 1.   Osteoarthritis of right knee 11/29/2017    Priority: 1.   Atypical chest pain 12/10/2019   Past Surgical History:  Procedure Laterality Date   arm trauma     he had an industrial accident in 1991    COLONOSCOPY  03/24/2016   EYE SURGERY     right eye cataract  in [place]   LUMBAR LAMINECTOMY/DECOMPRESSION MICRODISCECTOMY Bilateral 04/29/2021   Procedure: LAMINECTOMY/LAMINOTOMY AND FORAMINOTOMY BILATERAL LUMBAR ONE-TWO, LUMBAR THREE-FOUR, LEFT LUMBAR FOUR-FIVE;  Surgeon: Tressie Stalker, MD;  Location: [facility] OR;  Service: Neurosurgery;  Laterality: Bilateral;   PROSTATE BIOPSY  last July 2020   negative results per pt   SKIN GRAFT FULL THICKNESS ARM     right arm   ULNAR NERVE TRANSPOSITION     in 1991    Family History  Problem Relation Age of Onset   Cancer Father        colon   Heart disease Father 70    Kidney disease Father        nephectomy   Colon cancer Father 77   Diabetes Mother 52   Colon polyps Neg Hx    Esophageal cancer Neg Hx    Rectal cancer Neg Hx    Stomach cancer Neg Hx     Medications- reviewed and updated Current Outpatient Medications  Medication Sig Dispense Refill   amLODipine (NORVASC) 5 MG tablet TAKE 1 TABLET BY MOUTH EVERY DAY 90 tablet 3   aspirin EC 81 MG tablet Take 81 mg by mouth daily.     finasteride (PROSCAR) 5 MG tablet Take 5 mg by mouth daily.     hydrochlorothiazide (HYDRODIURIL) 25 MG tablet TAKE 1 TABLET BY MOUTH EVERY DAY 90 tablet 3   irbesartan (AVAPRO) 300 MG tablet TAKE 1 TABLET BY MOUTH EVERY DAY 90 tablet 3   rosuvastatin (CRESTOR) 10 MG tablet TAKE 1 TABLET BY MOUTH EVERY DAY 90 tablet 3   No current facility-administered medications for this visit.    Allergies-reviewed and updated No Known Allergies  Social History   Social History Narrative   Moved to [place] in 2011 from [place] due to more affordable.       Served in Electronics engineer. Fought [place]  and [place].       Married 1972 [place]). 2 kids. 5 grandkids. 2 in [place], 3 [place]. Son coaches at [employer] Autom[employer] engineer for soccer team and is a Buyer, retail from there.       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 Oropharynx normal Neck: no thyromegaly CV: RRR no murmurs rubs or gallops Lungs: CTAB no crackles, wheeze, rhonchi Abdomen: soft/nontender/nondistended/normal bowel sounds. No rebound or guarding.  Ext: no edema Skin: warm, dry Neuro: grossly normal, moves all extremities, PERRLA   Assessment and Plan  78 y.o. male presenting for annual  physical.  Health Maintenance counseling: 1. Anticipatory guidance: Patient counseled regarding regular dental exams -dentures, eye exams - has not seen- denies issues- consider updating exam,  avoiding smoking and second hand smoke , limiting alcohol to 2 beverages per day -not drinking, no illicit drugs .   2. Risk factor reduction:  Advised patient of need for regular exercise and diet rich and fruits and vegetables to reduce risk of heart attack and stroke.  Exercise- some walking last year- more yard work this year- goal 150 minutes a week- he thinks getting close.  Diet/weight management-within 4 lbs of last year and tends to fluctuate in this range - suspects will improve in summer months like usual.  Wt Readings from Last 3 Encounters:  12/08/23 164 lb 6.4 oz (74.6 kg)  05/25/23 162 lb (73.5 kg)  11/24/22 160 lb 6.4 oz (72.8 kg)  3. Immunizations/screenings/ancillary studies- opts out COVID  Immunization History  Administered Date(s) Administered   Fluad Quad(high Dose 65+) 10/14/2020, 05/20/2021, 06/14/2022   Fluad Trivalent(High Dose 65+) 05/25/2023   Influenza Split 07/13/2011, 07/14/2013   Influenza, High Dose Seasonal PF 07/27/2016, 06/27/2017, 06/20/2019, 07/29/2020   Influenza,inj,Quad PF,6+ Mos 07/01/2014, 08/18/2015, 07/13/2018   Influenza,inj,quad, With Preservative 07/27/2017   Influenza-Unspecified 06/14/2011, 06/12/2012, 06/13/2014   PFIZER(Purple Top)SARS-COV-2 Vaccination 10/05/2019, 10/26/2019, 09/22/2020   PNEUMOCOCCAL CONJUGATE-20 05/25/2022   Pneumococcal Conjugate-13 03/11/2014   Pneumococcal Polysaccharide-23 09/13/2008   Pneumococcal-Unspecified 03/13/2014   Td 10/14/2008   Tdap 09/16/2011   Zoster Recombinant(Shingrix) 08/05/2021, 10/07/2021   Zoster, Live 01/12/2013, 09/14/2014   4. Prostate cancer screening-  goes every 6 months to urology- decline PSA here. Active surveillance with Dr. Liliane Shi. Also on finasteride 5 mg for BPH  Lab Results  Component  Value Date   PSA 16.20 02/16/2021   PSA 11.60 03/06/2020   PSA 12.40 08/17/2019   5. Colon cancer screening - History of adenomatous polyp-October 2023 with Dr. Claudean Kinds recommend you have a repeat colonoscopy in 5 years   6. Skin cancer screening- does not see dermatology. advised regular sunscreen use. Denies worrisome, changing, or new skin lesions.  7. Smoking associated screening (lung cancer screening, AAA screen 65-75, UA)- never smoker 8. STD screening - only active with wife  Status of chronic or acute concerns   #also follow up at [facility]  - had visit in last month   # cervical surgery 2021 spine specialist for back-  Dr. Lovell Sheehan - much better. also doing better after  laminectomy 2022 -most recently follow up with Dr. Lorrine Kin February 2025 - reports doing well -also works with neurosurgery on low back issues- doing ok lately- has had surgery  #Chronic Hepatitis B S:Hepatitis B DNA has trended up over 2000 in the past but ALT remains less than 2 times upper limit of normal and later hepatitis B trended back down -Very rare alcohol in past- not drinking now  -Annual ultrasound to rule out hepatocellular carcinoma-last 12/22/22 and ordered 12/08/23 A/P: Hepatitis B dna levels have been very low- check today and LFTs and ordered ultrasound as above   #hypertension S: medication: Amlodipine 5 mg, hydrochlorothiazide 25 mg, irbesartan 300 mg Home readings #s: 118/64 this am BP Readings from Last 3 Encounters:  12/08/23 130/64  05/25/23 130/70  11/24/22 128/74  A/P: well controlled continue current medications    #hyperlipidemia S: Medication:Rosuvastatin 10 mg listed as daily but taking every third day and has still been controlled, aspirin 81 mg Lab Results  Component Value Date   CHOL 120 11/24/2022   HDL 52.90 11/24/2022   LDLCALC 45 11/24/2022   LDLDIRECT 60.0 05/20/2021   TRIG 114.0 11/24/2022   CHOLHDL 2 11/24/2022  A/P: lipids have looked great- update with labs    #Narrowed left ear canal-cleared by Dr. Pollyann Kennedy- listed as exostosis   Recommended follow up: Return in about 6 months (around 06/09/2024) for followup or sooner if needed.Schedule b4 you leave.  Lab/Order associations: fasting   ICD-10-CM   1. Preventative health care  Z00.00     2. Chronic viral hepatitis B without delta agent and without coma (HCC)  B18.1 Hepatitis B DNA, ultraquantitative, PCR    US ABDOMEN LIMITED RUQ (LIVER/GB)    CANCELED: US ABDOMEN LIMITED RUQ (LIVER/GB)    3. Prostate cancer (HCC)  C61     4. Primary hypertension  I10 Comprehensive metabolic panel with GFR    CBC with Differential/Platelet    Lipid panel    5. Hyperlipidemia, unspecified hyperlipidemia type  E78.5 Comprehensive metabolic panel with GFR    CBC with Differential/Platelet    Lipid panel    6. Screening for diabetes mellitus  Z13.1 Hemoglobin A1c    7. Overweight  E66.3 Hemoglobin A1c      No orders of the defined types were placed in this encounter.   Return precautions advised.  Tana Conch, MD

## 2023-12-08 NOTE — Patient Instructions (Addendum)
 Please stop by lab before you go If you have mychart- we will send your results within 3 business days of [place] receiving them.  If you do not have mychart- we will call you about results within 5 business days of [place] receiving them.  *please also note that you will see labs on mychart as soon as they post. I will later go in and write notes on them- will say "notes from Dr. Durene Cal"   We will call you within two weeks about your referral for ultrasound of liver through [facility].  Their phone number is [phone].  Please call them if you have not heard in 1-2 weeks  Recommended follow up: Return in about 6 months (around 06/09/2024) for followup or sooner if needed.Schedule b4 you leave.

## 2023-12-10 LAB — HEMOGLOBIN A1C: Hgb A1c MFr Bld: 5.6 % (ref 4.6–6.5)

## 2023-12-10 LAB — HEPATITIS B DNA, ULTRAQUANTITATIVE, PCR
Hepatitis B DNA: 760 [IU]/mL — ABNORMAL HIGH
Hepatitis B virus DNA: 2.88 {Log_IU}/mL — ABNORMAL HIGH

## 2023-12-12 ENCOUNTER — Encounter: Payer: Self-pay | Admitting: Family Medicine

## 2023-12-14 ENCOUNTER — Ambulatory Visit
Admission: RE | Admit: 2023-12-14 | Discharge: 2023-12-14 | Discharge status: Home / Self Care | Source: Ambulatory Visit | Attending: Family Medicine | Admitting: Family Medicine

## 2023-12-14 ENCOUNTER — Encounter: Payer: Self-pay | Admitting: Family Medicine

## 2023-12-14 DIAGNOSIS — B181 Chronic viral hepatitis B without delta-agent: Secondary | ICD-10-CM

## 2023-12-14 DIAGNOSIS — B1910 Unspecified viral hepatitis B without hepatic coma: Secondary | ICD-10-CM | POA: Diagnosis not present

## 2023-12-14 DIAGNOSIS — K8020 Calculus of gallbladder without cholecystitis without obstruction: Secondary | ICD-10-CM | POA: Diagnosis not present

## 2024-01-17 ENCOUNTER — Other Ambulatory Visit: Payer: Self-pay | Admitting: Urology

## 2024-01-17 DIAGNOSIS — R972 Elevated prostate specific antigen [PSA]: Secondary | ICD-10-CM

## 2024-01-17 DIAGNOSIS — Z8546 Personal history of malignant neoplasm of prostate: Secondary | ICD-10-CM

## 2024-01-20 ENCOUNTER — Encounter: Payer: Self-pay | Admitting: Urology

## 2024-03-03 ENCOUNTER — Ambulatory Visit
Admission: RE | Admit: 2024-03-03 | Discharge: 2024-03-03 | Disposition: A | Discharge status: Home / Self Care | Source: Ambulatory Visit | Attending: Urology

## 2024-03-03 DIAGNOSIS — N400 Enlarged prostate without lower urinary tract symptoms: Secondary | ICD-10-CM | POA: Diagnosis not present

## 2024-03-03 DIAGNOSIS — Z8546 Personal history of malignant neoplasm of prostate: Secondary | ICD-10-CM

## 2024-03-03 DIAGNOSIS — R972 Elevated prostate specific antigen [PSA]: Secondary | ICD-10-CM

## 2024-03-03 DIAGNOSIS — K5730 Diverticulosis of large intestine without perforation or abscess without bleeding: Secondary | ICD-10-CM | POA: Diagnosis not present

## 2024-03-03 DIAGNOSIS — N433 Hydrocele, unspecified: Secondary | ICD-10-CM | POA: Diagnosis not present

## 2024-03-03 MED ORDER — GADOPICLENOL 0.5 MMOL/ML IV SOLN
7.0000 mL | Freq: Once | INTRAVENOUS | Status: AC | PRN
  Administered 2024-03-03: 7 mL via INTRAVENOUS

## 2024-03-03 MED ORDER — GADOPICLENOL 0.5 MMOL/ML IV SOLN: 7.5000 mL | Freq: Once | INTRAVENOUS | Status: DC | PRN

## 2024-03-29 ENCOUNTER — Other Ambulatory Visit: Payer: Self-pay | Admitting: Family Medicine

## 2024-04-10 ENCOUNTER — Other Ambulatory Visit: Payer: Self-pay | Admitting: Family Medicine

## 2024-04-24 DIAGNOSIS — M4712 Other spondylosis with myelopathy, cervical region: Secondary | ICD-10-CM | POA: Diagnosis not present

## 2024-04-24 DIAGNOSIS — M48062 Spinal stenosis, lumbar region with neurogenic claudication: Secondary | ICD-10-CM | POA: Diagnosis not present

## 2024-05-23 DIAGNOSIS — C44519 Basal cell carcinoma of skin of other part of trunk: Secondary | ICD-10-CM | POA: Diagnosis not present

## 2024-05-23 DIAGNOSIS — D485 Neoplasm of uncertain behavior of skin: Secondary | ICD-10-CM | POA: Diagnosis not present

## 2024-05-23 DIAGNOSIS — L723 Sebaceous cyst: Secondary | ICD-10-CM | POA: Diagnosis not present

## 2024-05-23 DIAGNOSIS — L700 Acne vulgaris: Secondary | ICD-10-CM | POA: Diagnosis not present

## 2024-06-12 ENCOUNTER — Encounter: Payer: Self-pay | Admitting: Family Medicine

## 2024-06-12 ENCOUNTER — Ambulatory Visit: Payer: Self-pay | Admitting: Family Medicine

## 2024-06-12 ENCOUNTER — Ambulatory Visit (INDEPENDENT_AMBULATORY_CARE_PROVIDER_SITE_OTHER): Admitting: Family Medicine

## 2024-06-12 VITALS — BP 128/72 | HR 79 | Temp 97.3°F | Ht 65.0 in | Wt 165.8 lb

## 2024-06-12 DIAGNOSIS — Z23 Encounter for immunization: Secondary | ICD-10-CM | POA: Diagnosis not present

## 2024-06-12 DIAGNOSIS — E785 Hyperlipidemia, unspecified: Secondary | ICD-10-CM

## 2024-06-12 DIAGNOSIS — B181 Chronic viral hepatitis B without delta-agent: Secondary | ICD-10-CM

## 2024-06-12 DIAGNOSIS — I10 Essential (primary) hypertension: Secondary | ICD-10-CM

## 2024-06-12 LAB — COMPREHENSIVE METABOLIC PANEL WITH GFR
ALT: 14 U/L (ref 0–53)
AST: 16 U/L (ref 0–37)
Albumin: 4.6 g/dL (ref 3.5–5.2)
Alkaline Phosphatase: 32 U/L — ABNORMAL LOW (ref 39–117)
BUN: 22 mg/dL (ref 6–23)
CO2: 28 meq/L (ref 19–32)
Calcium: 10.3 mg/dL (ref 8.4–10.5)
Chloride: 101 meq/L (ref 96–112)
Creatinine, Ser: 1.14 mg/dL (ref 0.40–1.50)
GFR: 62.24 mL/min (ref >60.00)
Glucose, Bld: 98 mg/dL (ref 70–99)
Potassium: 4.3 meq/L (ref 3.5–5.1)
Sodium: 137 meq/L (ref 135–145)
Total Bilirubin: 1 mg/dL (ref 0.2–1.2)
Total Protein: 7.2 g/dL (ref 6.0–8.3)

## 2024-06-12 NOTE — Progress Notes (Signed)
 Phone [phone] In person visit   Subjective:   Anthony Mann is a 78 y.o. year old very pleasant male patient who presents for/with See problem oriented charting Chief Complaint  Patient presents with   Hyperlipidemia   Hypertension   Past Medical History-  Patient Active Problem List   Diagnosis Date Noted   Prostate cancer (HCC) 08/03/2017    Priority: High   Hepatitis B     Priority: High   Laryngopharyngeal reflux (LPR) 10/30/2019    Priority: Medium    Depressed mood 06/27/2017    Priority: Medium    BPH associated with nocturia 01/26/2016    Priority: Medium    Hyperlipidemia 07/16/2014    Priority: Medium    HTN (hypertension) 10/14/2010    Priority: Medium    History of adenomatous polyp of colon 05/11/2019    Priority: Low   Lumbar pseudoarthrosis 04/29/2021    Priority: 1.   Low back pain 12/04/2019    Priority: 1.   Osteoarthritis of right knee 11/29/2017    Priority: 1.   Atypical chest pain 12/10/2019    Medications- reviewed and updated Current Outpatient Medications  Medication Sig Dispense Refill   amLODipine  (NORVASC ) 5 MG tablet TAKE 1 TABLET BY MOUTH EVERY DAY 90 tablet 3   aspirin EC 81 MG tablet Take 81 mg by mouth daily.     finasteride  (PROSCAR ) 5 MG tablet Take 5 mg by mouth daily.     hydrochlorothiazide  (HYDRODIURIL ) 25 MG tablet TAKE 1 TABLET BY MOUTH EVERY DAY 90 tablet 3   irbesartan  (AVAPRO ) 300 MG tablet TAKE 1 TABLET BY MOUTH EVERY DAY 90 tablet 3   rosuvastatin  (CRESTOR ) 10 MG tablet TAKE 1 TABLET BY MOUTH EVERY DAY 90 tablet 3   No current facility-administered medications for this visit.     Objective:  BP 128/72 (BP Location: Left Arm, Patient Position: Sitting, Cuff Size: Normal)   Pulse 79   Temp (!) 97.3 F (36.3 C) (Temporal)   Ht 5' 5 (1.651 m)   Wt 165 lb 12.8 oz (75.2 kg)   SpO2 97%   BMI 27.59 kg/m  Gen: NAD, resting comfortably CV: RRR no murmurs rubs or gallops Lungs: CTAB no crackles, wheeze,  rhonchi Ext: minimal edema Skin: warm, dry     Assessment and Plan    #Chronic Hepatitis B S:Hepatitis B DNA has trended up over 2000 in the past but ALT remains less than 2 times upper limit of normal and later hepatitis B trended back down -Very rare alcohol-reports maybe once every 2 weeks 1-2 beers  -Annual ultrasound to rule out hepatocellular carcinoma-last visit completed this and low risk A/P: hepatitis B chronic but stable. We will check HBV DNA as well as LFTs today. Recheck ultrasound next visit   #hypertension S: medication: Amlodipine  5 mg, hydrochlorothiazide  25 mg, irbesartan  300 mg -trying to eat healthy diet-  fruit chicken and seems to help BP Readings from Last 3 Encounters:  06/12/24 128/72  12/08/23 130/64  05/25/23 130/70  A/P: well controlled continue current medications    #hyperlipidemia S: Medication:Rosuvastatin  10 mg listed as daily but taking every third day and has still been controlled, aspirin 81 mg Lab Results  Component Value Date   CHOL 106 12/08/2023   HDL 49.10 12/08/2023   LDLCALC 39 12/08/2023   LDLDIRECT 60.0 05/20/2021   TRIG 92.0 12/08/2023   CHOLHDL 2 12/08/2023  A/P: lipids look great even with just every 3 day dosing- continue current medications    #  Prostate cancer-remains on active surveillance with urology Dr. Darcey levels have trended down on finasteride - #BPH with nocturia S: Medication: Finasteride  5 mg  A/P: maintains active surveillance- doing well- continue current medications for BPH    #February 2021 with cervical radiculopathy and surgery- still has weakness in the left arm/shoulder  but improved. Still sees neurosurgery for this and his lumbar spine with history of surgery there. First 10 minutes of day the toughest but once gets moving does better    Recommended follow up: Return in about 6 months (around 12/10/2024) for physical or sooner if needed.Schedule b4 you leave.   Lab/Order associations:    ICD-10-CM   1. Chronic viral hepatitis B without delta agent and without coma (HCC)  B18.1 Hepatitis B DNA, ultraquantitative, PCR    Comprehensive metabolic panel with GFR    2. Primary hypertension  I10     3. Hyperlipidemia, unspecified hyperlipidemia type  E78.5     4. Need for influenza vaccination  Z23 Flu vaccine HIGH DOSE PF(Fluzone Trivalent)      No orders of the defined types were placed in this encounter.   Return precautions advised.  Garnette Lukes, MD

## 2024-06-12 NOTE — Patient Instructions (Addendum)
 Please stop by lab before you go If you have mychart- we will send your results within 3 business days of us  receiving them.  If you do not have mychart- we will call you about results within 5 business days of us  receiving them.  *please also note that you will see labs on mychart as soon as they post. I will later go in and write notes on them- will say notes from Dr. Katrinka   No changes today unless labs lead us  to make changes   Recommended follow up: Return in about 6 months (around 12/10/2024) for physical or sooner if needed.Schedule b4 you leave.

## 2024-06-14 DIAGNOSIS — C44519 Basal cell carcinoma of skin of other part of trunk: Secondary | ICD-10-CM | POA: Diagnosis not present

## 2024-06-17 LAB — HEPATITIS B DNA, ULTRAQUANTITATIVE, PCR
Hepatitis B DNA: 404 [IU]/mL — ABNORMAL HIGH
Hepatitis B virus DNA: 2.61 {Log_IU}/mL — ABNORMAL HIGH

## 2024-12-17 ENCOUNTER — Encounter: Admitting: Family Medicine
